# Patient Record
Sex: Female | Born: 1945
Health system: Western US, Academic
[De-identification: ages and names within clinical notes are randomized; demographics above are authoritative.]

## PROBLEM LIST (undated history)

## (undated) DIAGNOSIS — F329 Major depressive disorder, single episode, unspecified: Secondary | ICD-10-CM

## (undated) DIAGNOSIS — R7989 Other specified abnormal findings of blood chemistry: Secondary | ICD-10-CM

## (undated) DIAGNOSIS — I639 Cerebral infarction, unspecified: Secondary | ICD-10-CM

## (undated) DIAGNOSIS — L930 Discoid lupus erythematosus: Secondary | ICD-10-CM

## (undated) DIAGNOSIS — R51 Headache: Secondary | ICD-10-CM

## (undated) DIAGNOSIS — R945 Abnormal results of liver function studies: Secondary | ICD-10-CM

## (undated) DIAGNOSIS — F32A Depression, unspecified: Secondary | ICD-10-CM

## (undated) DIAGNOSIS — D6862 Lupus anticoagulant syndrome: Secondary | ICD-10-CM

## (undated) DIAGNOSIS — D353 Benign neoplasm of craniopharyngeal duct: Secondary | ICD-10-CM

## (undated) DIAGNOSIS — R519 Headache, unspecified: Secondary | ICD-10-CM

## (undated) DIAGNOSIS — F419 Anxiety disorder, unspecified: Secondary | ICD-10-CM

## (undated) DIAGNOSIS — E785 Hyperlipidemia, unspecified: Secondary | ICD-10-CM

## (undated) DIAGNOSIS — Z8679 Personal history of other diseases of the circulatory system: Secondary | ICD-10-CM

## (undated) DIAGNOSIS — I10 Essential (primary) hypertension: Secondary | ICD-10-CM

## (undated) DIAGNOSIS — D352 Benign neoplasm of pituitary gland: Secondary | ICD-10-CM

## (undated) HISTORY — DX: Headache, unspecified: R51.9

## (undated) HISTORY — DX: Benign neoplasm of craniopharyngeal duct: D35.3

## (undated) HISTORY — DX: Abnormal results of liver function studies: R94.5

## (undated) HISTORY — DX: Other specified abnormal findings of blood chemistry: R79.89

## (undated) HISTORY — DX: Major depressive disorder, single episode, unspecified: F32.9

## (undated) HISTORY — DX: Lupus anticoagulant syndrome: D68.62

## (undated) HISTORY — PX: ABDOMINAL HYSTERECTOMY: SHX81

## (undated) HISTORY — DX: Benign neoplasm of pituitary gland: D35.2

## (undated) HISTORY — DX: Essential (primary) hypertension: I10

## (undated) HISTORY — DX: Headache: R51

## (undated) HISTORY — DX: Cerebral infarction, unspecified: I63.9

## (undated) HISTORY — PX: TRANSPHENOIDAL PITUITARY RESECTION: SHX2572

## (undated) HISTORY — DX: Discoid lupus erythematosus: L93.0

## (undated) HISTORY — DX: Depression, unspecified: F32.A

## (undated) HISTORY — DX: Anxiety disorder, unspecified: F41.9

## (undated) HISTORY — DX: Hyperlipidemia, unspecified: E78.5

## (undated) HISTORY — DX: Personal history of other diseases of the circulatory system: Z86.79

---

## 2000-05-05 LAB — HM COLONOSCOPY: HM Colonoscopy: NORMAL

## 2003-01-02 ENCOUNTER — Encounter: Payer: Self-pay | Admitting: Emergency Medicine

## 2003-01-02 ENCOUNTER — Emergency Department (HOSPITAL_COMMUNITY): Admission: EM | Admit: 2003-01-02 | Discharge: 2003-01-02 | Payer: Self-pay | Admitting: Emergency Medicine

## 2003-01-13 ENCOUNTER — Ambulatory Visit (HOSPITAL_COMMUNITY): Admission: RE | Admit: 2003-01-13 | Discharge: 2003-01-13 | Payer: Self-pay | Admitting: Internal Medicine

## 2003-01-13 ENCOUNTER — Encounter: Payer: Self-pay | Admitting: Internal Medicine

## 2003-08-03 ENCOUNTER — Ambulatory Visit (HOSPITAL_COMMUNITY): Admission: RE | Admit: 2003-08-03 | Discharge: 2003-08-03 | Payer: Self-pay | Admitting: Internal Medicine

## 2003-10-19 ENCOUNTER — Ambulatory Visit (HOSPITAL_COMMUNITY): Admission: RE | Admit: 2003-10-19 | Discharge: 2003-10-19 | Payer: Self-pay | Admitting: Internal Medicine

## 2004-03-01 ENCOUNTER — Ambulatory Visit: Payer: Self-pay | Admitting: Internal Medicine

## 2004-03-26 ENCOUNTER — Ambulatory Visit: Payer: Self-pay | Admitting: Family Medicine

## 2004-04-22 ENCOUNTER — Ambulatory Visit: Payer: Self-pay | Admitting: Licensed Clinical Social Worker

## 2004-04-22 ENCOUNTER — Ambulatory Visit: Payer: Self-pay | Admitting: Internal Medicine

## 2004-05-01 ENCOUNTER — Ambulatory Visit: Payer: Self-pay | Admitting: Licensed Clinical Social Worker

## 2004-10-18 ENCOUNTER — Ambulatory Visit: Payer: Self-pay | Admitting: Internal Medicine

## 2004-11-04 ENCOUNTER — Ambulatory Visit: Payer: Self-pay | Admitting: Internal Medicine

## 2004-11-12 ENCOUNTER — Ambulatory Visit: Payer: Self-pay | Admitting: Internal Medicine

## 2004-11-13 ENCOUNTER — Ambulatory Visit: Payer: Self-pay | Admitting: Cardiology

## 2004-12-25 ENCOUNTER — Ambulatory Visit: Payer: Self-pay | Admitting: Internal Medicine

## 2005-01-13 ENCOUNTER — Ambulatory Visit: Payer: Self-pay | Admitting: Internal Medicine

## 2005-02-14 ENCOUNTER — Ambulatory Visit: Payer: Self-pay | Admitting: Internal Medicine

## 2005-03-06 ENCOUNTER — Other Ambulatory Visit: Admission: RE | Admit: 2005-03-06 | Discharge: 2005-03-06 | Payer: Self-pay | Admitting: Obstetrics and Gynecology

## 2005-03-21 ENCOUNTER — Ambulatory Visit: Payer: Self-pay | Admitting: Internal Medicine

## 2005-04-02 ENCOUNTER — Ambulatory Visit: Payer: Self-pay | Admitting: Internal Medicine

## 2005-04-26 ENCOUNTER — Ambulatory Visit: Payer: Self-pay | Admitting: Family Medicine

## 2005-06-20 ENCOUNTER — Ambulatory Visit: Payer: Self-pay | Admitting: Internal Medicine

## 2005-06-26 ENCOUNTER — Ambulatory Visit: Payer: Self-pay | Admitting: Internal Medicine

## 2005-06-30 ENCOUNTER — Ambulatory Visit: Payer: Self-pay | Admitting: Internal Medicine

## 2005-11-07 ENCOUNTER — Ambulatory Visit: Payer: Self-pay | Admitting: Internal Medicine

## 2005-11-08 ENCOUNTER — Encounter: Payer: Self-pay | Admitting: Vascular Surgery

## 2005-11-08 ENCOUNTER — Emergency Department (HOSPITAL_COMMUNITY): Admission: EM | Admit: 2005-11-08 | Discharge: 2005-11-08 | Payer: Self-pay | Admitting: Emergency Medicine

## 2005-11-10 ENCOUNTER — Ambulatory Visit: Payer: Self-pay | Admitting: Internal Medicine

## 2005-11-14 ENCOUNTER — Encounter: Admission: RE | Admit: 2005-11-14 | Discharge: 2005-11-14 | Payer: Self-pay | Admitting: Internal Medicine

## 2005-11-19 ENCOUNTER — Ambulatory Visit: Payer: Self-pay | Admitting: Internal Medicine

## 2006-02-12 ENCOUNTER — Ambulatory Visit: Payer: Self-pay | Admitting: Internal Medicine

## 2006-08-18 ENCOUNTER — Ambulatory Visit: Payer: Self-pay | Admitting: Internal Medicine

## 2006-08-27 ENCOUNTER — Encounter: Payer: Self-pay | Admitting: Internal Medicine

## 2006-08-27 ENCOUNTER — Ambulatory Visit: Payer: Self-pay

## 2006-12-04 DIAGNOSIS — F329 Major depressive disorder, single episode, unspecified: Secondary | ICD-10-CM

## 2006-12-04 DIAGNOSIS — R945 Abnormal results of liver function studies: Secondary | ICD-10-CM | POA: Insufficient documentation

## 2006-12-04 DIAGNOSIS — E785 Hyperlipidemia, unspecified: Secondary | ICD-10-CM | POA: Insufficient documentation

## 2006-12-04 DIAGNOSIS — F32A Depression, unspecified: Secondary | ICD-10-CM | POA: Insufficient documentation

## 2006-12-04 DIAGNOSIS — I10 Essential (primary) hypertension: Secondary | ICD-10-CM | POA: Insufficient documentation

## 2006-12-18 ENCOUNTER — Ambulatory Visit: Payer: Self-pay | Admitting: Internal Medicine

## 2006-12-18 LAB — CONVERTED CEMR LAB
Bilirubin Urine: NEGATIVE
Glucose, Urine, Semiquant: NEGATIVE
Ketones, urine, test strip: NEGATIVE
Nitrite: POSITIVE
Protein, U semiquant: NEGATIVE
Specific Gravity, Urine: 1.01
Urobilinogen, UA: NEGATIVE
pH: 7

## 2007-02-08 ENCOUNTER — Telehealth: Payer: Self-pay | Admitting: Internal Medicine

## 2007-03-05 ENCOUNTER — Ambulatory Visit: Payer: Self-pay | Admitting: Internal Medicine

## 2007-03-15 ENCOUNTER — Telehealth: Payer: Self-pay | Admitting: Internal Medicine

## 2007-03-17 ENCOUNTER — Telehealth: Payer: Self-pay | Admitting: Internal Medicine

## 2007-03-30 ENCOUNTER — Ambulatory Visit: Payer: Self-pay | Admitting: Internal Medicine

## 2007-03-30 ENCOUNTER — Telehealth: Payer: Self-pay | Admitting: Internal Medicine

## 2007-06-15 ENCOUNTER — Telehealth: Payer: Self-pay | Admitting: Internal Medicine

## 2007-06-21 ENCOUNTER — Telehealth: Payer: Self-pay | Admitting: Internal Medicine

## 2007-07-27 ENCOUNTER — Ambulatory Visit: Payer: Self-pay | Admitting: Internal Medicine

## 2007-08-10 ENCOUNTER — Ambulatory Visit: Payer: Self-pay | Admitting: Internal Medicine

## 2007-08-11 LAB — CONVERTED CEMR LAB
Albumin: 3.8 g/dL (ref 3.5–5.2)
BUN: 16 mg/dL (ref 6–23)
Calcium: 9.4 mg/dL (ref 8.4–10.5)
Eosinophils Relative: 2.2 % (ref 0.0–5.0)
GFR calc Af Amer: 109 mL/min
Glucose, Bld: 97 mg/dL (ref 70–99)
HCT: 42.2 % (ref 36.0–46.0)
Hemoglobin: 13.9 g/dL (ref 12.0–15.0)
Monocytes Absolute: 0.5 10*3/uL (ref 0.1–1.0)
Monocytes Relative: 8.9 % (ref 3.0–12.0)
Neutro Abs: 3.2 10*3/uL (ref 1.4–7.7)
RDW: 12 % (ref 11.5–14.6)
Sodium: 142 meq/L (ref 135–145)
Total Protein: 6.6 g/dL (ref 6.0–8.3)

## 2007-08-16 ENCOUNTER — Telehealth: Payer: Self-pay | Admitting: Internal Medicine

## 2007-09-07 ENCOUNTER — Telehealth: Payer: Self-pay | Admitting: Internal Medicine

## 2007-10-11 ENCOUNTER — Telehealth: Payer: Self-pay | Admitting: Internal Medicine

## 2007-10-14 ENCOUNTER — Ambulatory Visit: Payer: Self-pay | Admitting: Internal Medicine

## 2007-11-01 ENCOUNTER — Telehealth: Payer: Self-pay | Admitting: Internal Medicine

## 2008-03-09 ENCOUNTER — Ambulatory Visit: Payer: Self-pay | Admitting: Internal Medicine

## 2008-03-10 LAB — CONVERTED CEMR LAB
ALT: 35 units/L (ref 0–35)
AST: 25 units/L (ref 0–37)
Albumin: 3.6 g/dL (ref 3.5–5.2)
BUN: 16 mg/dL (ref 6–23)
Basophils Relative: 0.7 % (ref 0.0–3.0)
CO2: 29 meq/L (ref 19–32)
Chloride: 106 meq/L (ref 96–112)
Creatinine, Ser: 0.7 mg/dL (ref 0.4–1.2)
Eosinophils Relative: 2.4 % (ref 0.0–5.0)
Lymphocytes Relative: 34.9 % (ref 12.0–46.0)
Neutrophils Relative %: 53.1 % (ref 43.0–77.0)
RBC: 4.47 M/uL (ref 3.87–5.11)
TSH: 0.86 microintl units/mL (ref 0.35–5.50)
Total Bilirubin: 0.6 mg/dL (ref 0.3–1.2)
WBC: 5.3 10*3/uL (ref 4.5–10.5)

## 2008-04-07 ENCOUNTER — Telehealth: Payer: Self-pay | Admitting: Internal Medicine

## 2008-07-21 ENCOUNTER — Telehealth: Payer: Self-pay | Admitting: Family Medicine

## 2008-07-21 ENCOUNTER — Emergency Department (HOSPITAL_COMMUNITY): Admission: EM | Admit: 2008-07-21 | Discharge: 2008-07-21 | Payer: Self-pay | Admitting: Family Medicine

## 2008-09-28 ENCOUNTER — Ambulatory Visit: Payer: Self-pay | Admitting: Family Medicine

## 2008-10-18 ENCOUNTER — Telehealth (INDEPENDENT_AMBULATORY_CARE_PROVIDER_SITE_OTHER): Payer: Self-pay | Admitting: *Deleted

## 2008-11-13 ENCOUNTER — Telehealth: Payer: Self-pay | Admitting: Internal Medicine

## 2008-11-20 ENCOUNTER — Telehealth (INDEPENDENT_AMBULATORY_CARE_PROVIDER_SITE_OTHER): Payer: Self-pay | Admitting: *Deleted

## 2008-11-21 ENCOUNTER — Ambulatory Visit: Payer: Self-pay | Admitting: Family Medicine

## 2008-12-01 ENCOUNTER — Telehealth: Payer: Self-pay | Admitting: Internal Medicine

## 2008-12-07 ENCOUNTER — Ambulatory Visit (HOSPITAL_COMMUNITY): Admission: RE | Admit: 2008-12-07 | Discharge: 2008-12-07 | Payer: Self-pay | Admitting: Ophthalmology

## 2008-12-08 ENCOUNTER — Telehealth: Payer: Self-pay | Admitting: *Deleted

## 2008-12-20 ENCOUNTER — Ambulatory Visit: Payer: Self-pay | Admitting: Internal Medicine

## 2008-12-20 DIAGNOSIS — D444 Neoplasm of uncertain behavior of craniopharyngeal duct: Secondary | ICD-10-CM

## 2008-12-20 DIAGNOSIS — D443 Neoplasm of uncertain behavior of pituitary gland: Secondary | ICD-10-CM | POA: Insufficient documentation

## 2008-12-21 ENCOUNTER — Telehealth: Payer: Self-pay | Admitting: Internal Medicine

## 2008-12-22 DIAGNOSIS — Z86011 Personal history of benign neoplasm of the brain: Secondary | ICD-10-CM

## 2008-12-22 DIAGNOSIS — Z8603 Personal history of neoplasm of uncertain behavior: Secondary | ICD-10-CM | POA: Insufficient documentation

## 2008-12-26 LAB — CONVERTED CEMR LAB
Albumin: 3.6 g/dL (ref 3.5–5.2)
BUN: 17 mg/dL (ref 6–23)
Bilirubin, Direct: 0.1 mg/dL (ref 0.0–0.3)
CO2: 30 meq/L (ref 19–32)
Calcium: 9.2 mg/dL (ref 8.4–10.5)
Creatinine, Ser: 0.7 mg/dL (ref 0.4–1.2)
FSH: 28.8 milliintl units/mL
LH: 8.69 milliintl units/mL
Prolactin: 10.5 ng/mL
Total Protein: 6.9 g/dL (ref 6.0–8.3)

## 2008-12-28 ENCOUNTER — Telehealth: Payer: Self-pay | Admitting: Internal Medicine

## 2008-12-29 ENCOUNTER — Encounter: Payer: Self-pay | Admitting: Family Medicine

## 2009-01-26 ENCOUNTER — Ambulatory Visit: Payer: Self-pay | Admitting: Internal Medicine

## 2009-01-29 ENCOUNTER — Telehealth (INDEPENDENT_AMBULATORY_CARE_PROVIDER_SITE_OTHER): Payer: Self-pay | Admitting: *Deleted

## 2009-02-27 ENCOUNTER — Encounter (INDEPENDENT_AMBULATORY_CARE_PROVIDER_SITE_OTHER): Payer: Self-pay | Admitting: *Deleted

## 2009-02-28 ENCOUNTER — Ambulatory Visit: Payer: Self-pay | Admitting: Internal Medicine

## 2009-03-01 ENCOUNTER — Telehealth: Payer: Self-pay | Admitting: Internal Medicine

## 2009-03-19 ENCOUNTER — Telehealth (INDEPENDENT_AMBULATORY_CARE_PROVIDER_SITE_OTHER): Payer: Self-pay | Admitting: *Deleted

## 2009-03-22 ENCOUNTER — Ambulatory Visit: Payer: Self-pay | Admitting: Cardiology

## 2009-03-22 ENCOUNTER — Inpatient Hospital Stay (HOSPITAL_COMMUNITY): Admission: RE | Admit: 2009-03-22 | Discharge: 2009-04-13 | Payer: Self-pay | Admitting: Neurosurgery

## 2009-04-02 ENCOUNTER — Ambulatory Visit: Payer: Self-pay | Admitting: Infectious Diseases

## 2009-04-09 ENCOUNTER — Encounter (INDEPENDENT_AMBULATORY_CARE_PROVIDER_SITE_OTHER): Payer: Self-pay | Admitting: Neurosurgery

## 2009-04-10 ENCOUNTER — Ambulatory Visit: Payer: Self-pay | Admitting: Physical Medicine & Rehabilitation

## 2009-04-11 ENCOUNTER — Encounter (INDEPENDENT_AMBULATORY_CARE_PROVIDER_SITE_OTHER): Payer: Self-pay | Admitting: Neurosurgery

## 2009-04-12 ENCOUNTER — Encounter (INDEPENDENT_AMBULATORY_CARE_PROVIDER_SITE_OTHER): Payer: Self-pay | Admitting: Neurology

## 2009-04-13 ENCOUNTER — Ambulatory Visit: Payer: Self-pay | Admitting: Oncology

## 2009-04-13 ENCOUNTER — Inpatient Hospital Stay (HOSPITAL_COMMUNITY)
Admission: RE | Admit: 2009-04-13 | Discharge: 2009-04-19 | Payer: Self-pay | Admitting: Physical Medicine & Rehabilitation

## 2009-04-18 ENCOUNTER — Ambulatory Visit: Payer: Self-pay | Admitting: Physical Medicine & Rehabilitation

## 2009-04-19 ENCOUNTER — Ambulatory Visit: Payer: Self-pay | Admitting: Oncology

## 2009-05-02 ENCOUNTER — Encounter
Admission: RE | Admit: 2009-05-02 | Discharge: 2009-05-03 | Payer: Self-pay | Admitting: Physical Medicine & Rehabilitation

## 2009-05-03 ENCOUNTER — Encounter
Admission: RE | Admit: 2009-05-03 | Discharge: 2009-08-01 | Payer: Self-pay | Admitting: Physical Medicine & Rehabilitation

## 2009-05-08 ENCOUNTER — Encounter: Payer: Self-pay | Admitting: Internal Medicine

## 2009-05-08 LAB — CBC WITH DIFFERENTIAL/PLATELET
BASO%: 1 % (ref 0.0–2.0)
EOS%: 2.3 % (ref 0.0–7.0)
HCT: 40 % (ref 34.8–46.6)
LYMPH%: 27.1 % (ref 14.0–49.7)
MCH: 32.7 pg (ref 25.1–34.0)
MCHC: 34.5 g/dL (ref 31.5–36.0)
MONO#: 0.5 10*3/uL (ref 0.1–0.9)
NEUT%: 59 % (ref 38.4–76.8)
Platelets: 209 10*3/uL (ref 145–400)
RBC: 4.23 10*6/uL (ref 3.70–5.45)
WBC: 4.7 10*3/uL (ref 3.9–10.3)
lymph#: 1.3 10*3/uL (ref 0.9–3.3)

## 2009-05-08 LAB — PROTHROMBIN TIME
INR: 1.05 (ref <1.50)
Prothrombin Time: 13.6 seconds (ref 11.6–15.2)

## 2009-05-08 LAB — MORPHOLOGY: PLT EST: ADEQUATE

## 2009-05-09 LAB — LUPUS ANTICOAGULANT PANEL: PTTLA Confirmation: 3.3 secs (ref <8.0)

## 2009-05-09 LAB — COMPREHENSIVE METABOLIC PANEL
ALT: 126 U/L — ABNORMAL HIGH (ref 0–35)
AST: 52 U/L — ABNORMAL HIGH (ref 0–37)
Albumin: 3.9 g/dL (ref 3.5–5.2)
CO2: 23 mEq/L (ref 19–32)
Calcium: 9.2 mg/dL (ref 8.4–10.5)
Chloride: 106 mEq/L (ref 96–112)
Potassium: 4.2 mEq/L (ref 3.5–5.3)
Total Protein: 6.7 g/dL (ref 6.0–8.3)

## 2009-05-09 LAB — LACTATE DEHYDROGENASE: LDH: 144 U/L (ref 94–250)

## 2009-05-15 ENCOUNTER — Encounter: Payer: Self-pay | Admitting: Internal Medicine

## 2009-05-16 ENCOUNTER — Encounter: Payer: Self-pay | Admitting: Family Medicine

## 2009-05-17 ENCOUNTER — Ambulatory Visit: Payer: Self-pay | Admitting: Oncology

## 2009-05-18 ENCOUNTER — Encounter: Payer: Self-pay | Admitting: Internal Medicine

## 2009-05-19 ENCOUNTER — Emergency Department (HOSPITAL_COMMUNITY): Admission: EM | Admit: 2009-05-19 | Discharge: 2009-05-20 | Payer: Self-pay | Admitting: Emergency Medicine

## 2009-05-21 ENCOUNTER — Encounter: Payer: Self-pay | Admitting: Internal Medicine

## 2009-05-21 LAB — CBC WITH DIFFERENTIAL/PLATELET
BASO%: 1.2 % (ref 0.0–2.0)
Basophils Absolute: 0.1 10*3/uL (ref 0.0–0.1)
EOS%: 2.9 % (ref 0.0–7.0)
Eosinophils Absolute: 0.1 10*3/uL (ref 0.0–0.5)
HCT: 39.5 % (ref 34.8–46.6)
HGB: 13.4 g/dL (ref 11.6–15.9)
LYMPH%: 37 % (ref 14.0–49.7)
MCH: 31.6 pg (ref 25.1–34.0)
MCHC: 33.9 g/dL (ref 31.5–36.0)
MCV: 93.2 fL (ref 79.5–101.0)
MONO#: 0.6 10*3/uL (ref 0.1–0.9)
MONO%: 12.3 % (ref 0.0–14.0)
NEUT#: 2.2 10*3/uL (ref 1.5–6.5)
NEUT%: 46.6 % (ref 38.4–76.8)
Platelets: 205 10*3/uL (ref 145–400)
RBC: 4.24 10*6/uL (ref 3.70–5.45)
RDW: 12.8 % (ref 11.2–14.5)
WBC: 4.8 10*3/uL (ref 3.9–10.3)
lymph#: 1.8 10*3/uL (ref 0.9–3.3)

## 2009-05-21 LAB — PROTIME-INR
INR: 1.6 — ABNORMAL LOW (ref 2.00–3.50)
Protime: 19.2 Seconds — ABNORMAL HIGH (ref 10.6–13.4)

## 2009-05-25 LAB — PROTIME-INR: Protime: 24 Seconds — ABNORMAL HIGH (ref 10.6–13.4)

## 2009-05-29 ENCOUNTER — Encounter: Payer: Self-pay | Admitting: Internal Medicine

## 2009-05-29 LAB — CBC WITH DIFFERENTIAL/PLATELET
BASO%: 0.7 % (ref 0.0–2.0)
EOS%: 2.5 % (ref 0.0–7.0)
HCT: 42.3 % (ref 34.8–46.6)
LYMPH%: 35.7 % (ref 14.0–49.7)
MCH: 31.3 pg (ref 25.1–34.0)
MCHC: 33.6 g/dL (ref 31.5–36.0)
NEUT%: 50 % (ref 38.4–76.8)
Platelets: 209 10*3/uL (ref 145–400)
RBC: 4.54 10*6/uL (ref 3.70–5.45)
lymph#: 2.5 10*3/uL (ref 0.9–3.3)
nRBC: 0 % (ref 0–0)

## 2009-06-01 ENCOUNTER — Encounter: Payer: Self-pay | Admitting: Internal Medicine

## 2009-06-01 LAB — PROTIME-INR

## 2009-06-04 LAB — COMPREHENSIVE METABOLIC PANEL
ALT: 40 U/L — ABNORMAL HIGH (ref 0–35)
Albumin: 4 g/dL (ref 3.5–5.2)
CO2: 24 mEq/L (ref 19–32)
Calcium: 9.5 mg/dL (ref 8.4–10.5)
Chloride: 106 mEq/L (ref 96–112)
Sodium: 142 mEq/L (ref 135–145)
Total Protein: 6.7 g/dL (ref 6.0–8.3)

## 2009-06-04 LAB — LUPUS ANTICOAGULANT PANEL
DRVVT 1:1 Mix: 43.9 secs (ref 36.1–47.0)
PTT Lupus Anticoagulant: 63.4 secs — ABNORMAL HIGH (ref 32.0–43.4)

## 2009-06-08 LAB — PROTIME-INR: Protime: 28.8 Seconds — ABNORMAL HIGH (ref 10.6–13.4)

## 2009-06-11 LAB — HEXAGONAL PHOSPHOLIPID NEUTRALIZATION: Hex Phosph Neut Test: POSITIVE — AB

## 2009-06-19 ENCOUNTER — Ambulatory Visit: Payer: Self-pay | Admitting: Oncology

## 2009-06-19 ENCOUNTER — Encounter: Payer: Self-pay | Admitting: Internal Medicine

## 2009-06-21 ENCOUNTER — Ambulatory Visit: Payer: Self-pay | Admitting: Internal Medicine

## 2009-06-22 ENCOUNTER — Encounter: Payer: Self-pay | Admitting: Internal Medicine

## 2009-06-22 LAB — BASIC METABOLIC PANEL
BUN: 15 mg/dL (ref 6–23)
Calcium: 9.1 mg/dL (ref 8.4–10.5)
Potassium: 3.9 mEq/L (ref 3.5–5.3)

## 2009-06-22 LAB — PROTIME-INR
INR: 1.9 — ABNORMAL LOW (ref 2.00–3.50)
Protime: 22.8 Seconds — ABNORMAL HIGH (ref 10.6–13.4)

## 2009-07-17 ENCOUNTER — Encounter
Admission: RE | Admit: 2009-07-17 | Discharge: 2009-07-18 | Payer: Self-pay | Admitting: Physical Medicine & Rehabilitation

## 2009-07-18 ENCOUNTER — Ambulatory Visit: Payer: Self-pay | Admitting: Physical Medicine & Rehabilitation

## 2009-07-27 ENCOUNTER — Ambulatory Visit: Payer: Self-pay | Admitting: Oncology

## 2009-07-31 ENCOUNTER — Encounter: Payer: Self-pay | Admitting: Internal Medicine

## 2009-07-31 LAB — SEDIMENTATION RATE: Sed Rate: 16 mm/h (ref 0–22)

## 2009-07-31 LAB — CBC WITH DIFFERENTIAL/PLATELET
BASO%: 0.6 % (ref 0.0–2.0)
LYMPH%: 31.3 % (ref 14.0–49.7)
MCHC: 34.1 g/dL (ref 31.5–36.0)
MCV: 92.8 fL (ref 79.5–101.0)
MONO%: 11.1 % (ref 0.0–14.0)
Platelets: 195 10*3/uL (ref 145–400)
RBC: 4.3 10*6/uL (ref 3.70–5.45)

## 2009-07-31 LAB — PROTIME-INR: Protime: 32.4 Seconds — ABNORMAL HIGH (ref 10.6–13.4)

## 2009-08-01 ENCOUNTER — Encounter: Payer: Self-pay | Admitting: Internal Medicine

## 2009-08-03 ENCOUNTER — Encounter: Admission: RE | Admit: 2009-08-03 | Discharge: 2009-08-03 | Payer: Self-pay | Admitting: Neurology

## 2009-08-06 ENCOUNTER — Ambulatory Visit: Payer: Self-pay | Admitting: Internal Medicine

## 2009-08-07 LAB — CONVERTED CEMR LAB
ALT: 25 units/L (ref 0–35)
AST: 17 units/L (ref 0–37)
Alkaline Phosphatase: 81 units/L (ref 39–117)
BUN: 18 mg/dL (ref 6–23)
Basophils Absolute: 0 10*3/uL (ref 0.0–0.1)
Bilirubin, Direct: 0.1 mg/dL (ref 0.0–0.3)
Calcium: 9.1 mg/dL (ref 8.4–10.5)
Creatinine, Ser: 0.7 mg/dL (ref 0.4–1.2)
GFR calc non Af Amer: 89.74 mL/min (ref >60)
Glucose, Bld: 100 mg/dL — ABNORMAL HIGH (ref 70–99)
HCT: 41.9 % (ref 36.0–46.0)
Hemoglobin: 14.5 g/dL (ref 12.0–15.0)
Lymphs Abs: 2.1 10*3/uL (ref 0.7–4.0)
MCV: 92.8 fL (ref 78.0–100.0)
Monocytes Absolute: 0.5 10*3/uL (ref 0.1–1.0)
Neutro Abs: 3.2 10*3/uL (ref 1.4–7.7)
Platelets: 202 10*3/uL (ref 150.0–400.0)
RDW: 12.5 % (ref 11.5–14.6)
Sodium: 142 meq/L (ref 135–145)
Total Bilirubin: 0.5 mg/dL (ref 0.3–1.2)
Total CHOL/HDL Ratio: 3

## 2009-08-13 ENCOUNTER — Encounter
Admission: RE | Admit: 2009-08-13 | Discharge: 2009-09-11 | Payer: Self-pay | Admitting: Physical Medicine & Rehabilitation

## 2009-08-17 ENCOUNTER — Encounter: Payer: Self-pay | Admitting: Internal Medicine

## 2009-08-27 ENCOUNTER — Telehealth: Payer: Self-pay | Admitting: Internal Medicine

## 2009-08-27 ENCOUNTER — Ambulatory Visit: Payer: Self-pay | Admitting: Oncology

## 2009-08-28 ENCOUNTER — Encounter: Payer: Self-pay | Admitting: Internal Medicine

## 2009-08-28 LAB — COMPREHENSIVE METABOLIC PANEL
ALT: 16 U/L (ref 0–35)
AST: 13 U/L (ref 0–37)
CO2: 26 mEq/L (ref 19–32)
Chloride: 107 mEq/L (ref 96–112)
Sodium: 143 mEq/L (ref 135–145)
Total Bilirubin: 0.5 mg/dL (ref 0.3–1.2)
Total Protein: 6.4 g/dL (ref 6.0–8.3)

## 2009-08-28 LAB — PROTIME-INR

## 2009-08-29 ENCOUNTER — Ambulatory Visit: Payer: Self-pay | Admitting: Internal Medicine

## 2009-08-29 ENCOUNTER — Telehealth: Payer: Self-pay | Admitting: Internal Medicine

## 2009-09-04 ENCOUNTER — Encounter: Admission: RE | Admit: 2009-09-04 | Discharge: 2009-09-04 | Payer: Self-pay | Admitting: Neurosurgery

## 2009-09-11 ENCOUNTER — Encounter
Admission: RE | Admit: 2009-09-11 | Discharge: 2009-09-11 | Payer: Self-pay | Admitting: Physical Medicine & Rehabilitation

## 2009-09-26 ENCOUNTER — Ambulatory Visit: Payer: Self-pay | Admitting: Oncology

## 2009-09-26 LAB — CBC WITH DIFFERENTIAL/PLATELET
Basophils Absolute: 0 10*3/uL (ref 0.0–0.1)
EOS%: 1.9 % (ref 0.0–7.0)
HCT: 43 % (ref 34.8–46.6)
HGB: 14.5 g/dL (ref 11.6–15.9)
MCH: 30.4 pg (ref 25.1–34.0)
MCV: 90.4 fL (ref 79.5–101.0)
MONO%: 7.4 % (ref 0.0–14.0)
NEUT%: 57.6 % (ref 38.4–76.8)
Platelets: 200 10*3/uL (ref 145–400)

## 2009-09-26 LAB — PROTIME-INR

## 2009-09-27 ENCOUNTER — Telehealth: Payer: Self-pay | Admitting: Internal Medicine

## 2009-09-28 ENCOUNTER — Telehealth: Payer: Self-pay | Admitting: Internal Medicine

## 2009-10-05 ENCOUNTER — Telehealth: Payer: Self-pay | Admitting: Internal Medicine

## 2009-10-11 LAB — PROTIME-INR
INR: 1.9 — ABNORMAL LOW (ref 2.00–3.50)
Protime: 22.8 Seconds — ABNORMAL HIGH (ref 10.6–13.4)

## 2009-11-06 ENCOUNTER — Ambulatory Visit: Payer: Self-pay | Admitting: Internal Medicine

## 2009-11-08 ENCOUNTER — Telehealth: Payer: Self-pay | Admitting: Internal Medicine

## 2009-11-16 ENCOUNTER — Ambulatory Visit: Payer: Self-pay | Admitting: Oncology

## 2009-11-20 LAB — CBC WITH DIFFERENTIAL/PLATELET
Basophils Absolute: 0 10*3/uL (ref 0.0–0.1)
Eosinophils Absolute: 0.1 10*3/uL (ref 0.0–0.5)
HGB: 14.3 g/dL (ref 11.6–15.9)
MCV: 91.3 fL (ref 79.5–101.0)
MONO%: 9.8 % (ref 0.0–14.0)
NEUT#: 3.2 10*3/uL (ref 1.5–6.5)
RDW: 12.6 % (ref 11.2–14.5)

## 2009-11-21 LAB — BETA-2 GLYCOPROTEIN ANTIBODIES
Beta-2-Glycoprotein I IgA: 2 A Units (ref <20)
Beta-2-Glycoprotein I IgM: 9 M Units (ref <20)

## 2009-11-21 LAB — CARDIOLIPIN ANTIBODIES, IGG, IGM, IGA: Anticardiolipin IgG: 13 GPL U/mL (ref <23)

## 2009-11-22 LAB — LUPUS ANTICOAGULANT PANEL

## 2009-11-27 ENCOUNTER — Encounter: Payer: Self-pay | Admitting: Internal Medicine

## 2009-12-11 LAB — PROTIME-INR: Protime: 31.2 Seconds — ABNORMAL HIGH (ref 10.6–13.4)

## 2009-12-25 ENCOUNTER — Encounter: Payer: Self-pay | Admitting: Internal Medicine

## 2010-01-03 ENCOUNTER — Ambulatory Visit: Payer: Self-pay | Admitting: Oncology

## 2010-01-08 LAB — PROTIME-INR
INR: 2.4 (ref 2.00–3.50)
Protime: 28.8 Seconds — ABNORMAL HIGH (ref 10.6–13.4)

## 2010-01-14 ENCOUNTER — Telehealth: Payer: Self-pay | Admitting: Internal Medicine

## 2010-01-28 ENCOUNTER — Telehealth: Payer: Self-pay | Admitting: Internal Medicine

## 2010-02-05 ENCOUNTER — Ambulatory Visit: Payer: Self-pay | Admitting: Internal Medicine

## 2010-02-11 ENCOUNTER — Ambulatory Visit: Payer: Self-pay | Admitting: Family Medicine

## 2010-02-11 DIAGNOSIS — N390 Urinary tract infection, site not specified: Secondary | ICD-10-CM | POA: Insufficient documentation

## 2010-02-11 DIAGNOSIS — L732 Hidradenitis suppurativa: Secondary | ICD-10-CM | POA: Insufficient documentation

## 2010-02-11 DIAGNOSIS — R319 Hematuria, unspecified: Secondary | ICD-10-CM | POA: Insufficient documentation

## 2010-02-12 ENCOUNTER — Encounter: Payer: Self-pay | Admitting: Internal Medicine

## 2010-02-15 ENCOUNTER — Ambulatory Visit: Payer: Self-pay | Admitting: Family Medicine

## 2010-02-15 DIAGNOSIS — L03211 Cellulitis of face: Secondary | ICD-10-CM

## 2010-02-15 DIAGNOSIS — L0201 Cutaneous abscess of face: Secondary | ICD-10-CM | POA: Insufficient documentation

## 2010-02-15 DIAGNOSIS — T887XXA Unspecified adverse effect of drug or medicament, initial encounter: Secondary | ICD-10-CM | POA: Insufficient documentation

## 2010-02-25 ENCOUNTER — Encounter: Payer: Self-pay | Admitting: Internal Medicine

## 2010-03-08 ENCOUNTER — Ambulatory Visit: Payer: Self-pay | Admitting: Oncology

## 2010-03-11 ENCOUNTER — Telehealth: Payer: Self-pay | Admitting: Internal Medicine

## 2010-03-12 ENCOUNTER — Encounter: Payer: Self-pay | Admitting: Internal Medicine

## 2010-03-12 LAB — CBC WITH DIFFERENTIAL/PLATELET
Eosinophils Absolute: 0.1 10*3/uL (ref 0.0–0.5)
LYMPH%: 38.6 % (ref 14.0–49.7)
MONO#: 0.6 10*3/uL (ref 0.1–0.9)
NEUT#: 2.9 10*3/uL (ref 1.5–6.5)
Platelets: 199 10*3/uL (ref 145–400)
RBC: 5.07 10*6/uL (ref 3.70–5.45)
RDW: 13.1 % (ref 11.2–14.5)
WBC: 5.9 10*3/uL (ref 3.9–10.3)
lymph#: 2.3 10*3/uL (ref 0.9–3.3)
nRBC: 0 % (ref 0–0)

## 2010-03-12 LAB — PROTIME-INR
INR: 2.8 (ref 2.00–3.50)
Protime: 33.6 Seconds — ABNORMAL HIGH (ref 10.6–13.4)

## 2010-03-14 ENCOUNTER — Telehealth: Payer: Self-pay | Admitting: Internal Medicine

## 2010-03-20 ENCOUNTER — Telehealth: Payer: Self-pay | Admitting: Internal Medicine

## 2010-04-16 ENCOUNTER — Encounter
Admission: RE | Admit: 2010-04-16 | Discharge: 2010-04-16 | Payer: Self-pay | Source: Home / Self Care | Attending: Neurosurgery | Admitting: Neurosurgery

## 2010-05-01 ENCOUNTER — Ambulatory Visit: Payer: Self-pay | Admitting: Oncology

## 2010-05-08 ENCOUNTER — Telehealth: Payer: Self-pay | Admitting: Internal Medicine

## 2010-05-14 LAB — PROTIME-INR
INR: 3.1 (ref 2.00–3.50)
Protime: 37.2 Seconds — ABNORMAL HIGH (ref 10.6–13.4)

## 2010-05-26 ENCOUNTER — Encounter: Payer: Self-pay | Admitting: Internal Medicine

## 2010-05-28 LAB — PROTIME-INR
INR: 2.1 (ref 2.00–3.50)
Protime: 25.2 Seconds — ABNORMAL HIGH (ref 10.6–13.4)

## 2010-06-02 LAB — CONVERTED CEMR LAB
ALT: 29 units/L (ref 0–40)
AST: 23 units/L (ref 0–37)
Albumin: 3.8 g/dL (ref 3.5–5.2)
Alkaline Phosphatase: 93 units/L (ref 39–117)
BUN: 19 mg/dL (ref 6–23)
Bilirubin, Direct: 0.1 mg/dL (ref 0.0–0.3)
CO2: 29 meq/L (ref 19–32)
Calcium: 9.8 mg/dL (ref 8.4–10.5)
Chloride: 107 meq/L (ref 96–112)
Cholesterol: 203 mg/dL (ref 0–200)
Creatinine, Ser: 0.7 mg/dL (ref 0.4–1.2)
Direct LDL: 120.6 mg/dL
GFR calc Af Amer: 110 mL/min
GFR calc non Af Amer: 91 mL/min
Glucose, Bld: 100 mg/dL — ABNORMAL HIGH (ref 70–99)
HDL: 56.3 mg/dL (ref >39.0)
Potassium: 4 meq/L (ref 3.5–5.1)
Sodium: 144 meq/L (ref 135–145)
Total Bilirubin: 0.7 mg/dL (ref 0.3–1.2)
Total CHOL/HDL Ratio: 3.6
Total Protein: 7.4 g/dL (ref 6.0–8.3)
Triglycerides: 114 mg/dL (ref 0–149)
VLDL: 23 mg/dL (ref 0–40)

## 2010-06-04 NOTE — Assessment & Plan Note (Signed)
Summary: fu per hus/njr   Vital Signs:  Patient profile:   65 year old female Weight:      192 pounds BMI:     33.08 Temp:     98 degrees F oral Pulse rate:   68 / minute Resp:     12 per minute BP sitting:   132 / 80  (left arm)  Vitals Entered By: Gladis Riffle, RN (June 21, 2009 10:26 AM) CC: hospital FU Is Patient Diabetic? No Comments now walks with cane   CC:  hospital FU.  History of Present Illness:  Follow-Up Visit      This is a 65 year old woman who presents for Follow-up visit.  The patient denies chest pain and palpitations.  Since the last visit the patient notes a recent hospitilization and being seen by a specialist.  The patient reports taking meds as prescribed.  When questioned about possible medication side effects, the patient notes none.   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 Take One Tablet By Mouth Daily 2)  Famotidine 20 Mg Tabs (Famotidine) .... Take 1 Tablet By Mouth Two Times A Day 3)  Simvastatin 20 Mg Tabs (Simvastatin) .... Take 1 Tablet By Mouth At Bedtime 4)  Hydrocortisone 10 Mg Tabs (Hydrocortisone) .... Take 1 Tablet By Mouth Once A Day 5)  Warfarin Sodium 5 Mg Tabs (Warfarin Sodium) .... Take 1 Tablet By Mouth Once A Day As Directed 6)  Aspir-Low 81 Mg Tbec (Aspirin) .... Once Daily  Allergies: 1)  ! 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   Eyes:  pupils equal and pupils round.   Neck:  No deformities, masses, or tenderness noted. Lungs:  Normal respiratory effort, chest expands symmetrically. Lungs are clear to auscultation, no crackles or wheezes Heart:  normal rate and regular rhythm.   Abdomen:  soft and non-tender.   Msk:  No deformity or scoliosis noted of thoracic or lumbar spine.   Neurologic:  cranial nerves II-XII intact and gait normal.   Psych:  memory intact for recent and remote and not anxious appearing.     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 Take 1 tab by mouth every morning  BP today: 132/80 Prior BP: 130/84 (02/28/2009)  Labs  Reviewed: K+: 3.3 (12/20/2008) Creat: : 0.7 (12/20/2008)   Chol: 203 (08/18/2006)   HDL: 56.3 (08/18/2006)   LDL: DEL (08/18/2006)   TG: 114 (08/18/2006)  Problem # 4:  HYPERLIPIDEMIA (ICD-272.4) controlled continue current medications  Her updated medication list for this problem includes:    Simvastatin 20 Mg Tabs (Simvastatin) .Marland Kitchen... Take 1 tablet by mouth at bedtime  Labs Reviewed: SGOT: 28 (12/20/2008)   SGPT: 43 (12/20/2008)   HDL:56.3 (08/18/2006)  LDL:DEL (08/18/2006)  Chol:203 (08/18/2006)  Trig:114 (08/18/2006)  Problem # 5:  DEPRESSION (ICD-311)  Her updated medication list for this problem includes:    Citalopram Hydrobromide 40 Mg Tabs (Citalopram hydrobromide) .Marland Kitchen... Take one tablet by mouth daily  Problem # 6:  CVA (ICD-434.91) postoperative not hypercoagulable state  Her updated medication list for this problem includes:    Warfarin Sodium 5 Mg Tabs (Warfarin sodium) .Marland Kitchen... Take 1 tablet by mouth once a day as directed    Aspir-low 81 Mg Tbec (Aspirin) ..... Once daily  Complete Medication List: 1)  Citalopram Hydrobromide 40 Mg Tabs (Citalopram hydrobromide) .... Take one tablet by mouth daily 2)  Famotidine 20 Mg Tabs (Famotidine) .... Take 1 tablet by mouth two times a day 3)  Simvastatin 20 Mg Tabs (Simvastatin) .... Take 1 tablet by mouth at bedtime 4)  Hydrocortisone 10 Mg Tabs (Hydrocortisone) .... Take 1 tablet by mouth once a day 5)  Warfarin Sodium 5 Mg Tabs (Warfarin sodium) .... Take 1 tablet by mouth once a day as directed 6)  Aspir-low 81 Mg Tbec (Aspirin) .... Once daily  Patient Instructions: 1)  6 weeks Prescriptions: FAMOTIDINE 20 MG TABS (FAMOTIDINE) Take 1 tablet by mouth two times a day  #60 x 0   Entered by:   Kern Reap CMA (AAMA)   Authorized by:   Birdie Sons MD   Signed by:   Kern Reap CMA (AAMA) on 06/29/2009   Method used:   [facility]  [place] [address] [facility]       [address]        [place], [place]  [address]       Ph: [phone] or [phone]       Fax: [phone]   RxID:   [id number]

## 2010-06-04 NOTE — Progress Notes (Signed)
Summary:  Prudy Feeler) restart?  Phone Note Refill Request Message from:  Fax from Pharmacy on Sep 27, 2009 1:42 PM  Refills Requested: Medication #1:  ALPRAZOLAM 0.5MG  TABLET   Notes: Qty-30.Marland Kitchen..5 refills...Marland KitchenMarland KitchenMarland Kitchen [facility].    Initial call taken by: Debbra Riding,  Sep 27, 2009 1:43 PM  Follow-up for Phone Call        was dcd in hospital.  Do you want to restart? Follow-up by: Gladis Riffle, RN,  Sep 27, 2009 1:47 PM  Additional Follow-up for Phone Call Additional follow up Details #1::        no need to restart Additional Follow-up by: Birdie Sons MD,  Sep 27, 2009 2:32 PM    Additional Follow-up for Phone Call Additional follow up Details #2::    see new note from today from pt. Follow-up by: Gladis Riffle, RN,  Sep 28, 2009 11:22 AM

## 2010-06-04 NOTE — Assessment & Plan Note (Signed)
Summary: ADVERSE REACTION TO MEDS RX ON MONDAY/ SHAKES.Amanda KitchenSLM   Vital Signs:  Patient profile:   65 year old female Temp:     98.3 degrees F Pulse rate:   84 / minute BP sitting:   140 / 88  (left arm) Cuff size:   large  Vitals Entered By: Pura Spice, RN (February 15, 2010 2:53 PM) CC: thinks having reaction to septra   History of Present Illness: Here to discuss a possible reaction to Bactrim. She was seen here earlier this week for a cellulitis on the chin from a cosmetic procedure and also a UTI. Her culture grew Klebsiella that is sensitive to bactrim. However in the past few days she has had shaking, weakness, mild SOB, and mild nausea. No rashes or vomitting. Drinking fluids.   Allergies: 1)  ! Sulfa 2)  ! * Peanut Oil 3)  * Shrimp  Past History:  Past Medical History: Reviewed history from 06/21/2009 and no changes required. 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 Hysterectomy  transsphenoidal resection of the pituitary lesion  Review of Systems  The patient denies anorexia, fever, weight loss, weight gain, vision loss, decreased hearing, hoarseness, chest pain, syncope, dyspnea on exertion, peripheral edema, prolonged cough, headaches, hemoptysis, abdominal pain, melena, hematochezia, severe indigestion/heartburn, hematuria, incontinence, genital sores, muscle weakness, suspicious skin lesions, transient blindness, difficulty walking, depression, unusual weight change, abnormal bleeding, enlarged lymph nodes, angioedema, breast masses, and testicular masses.    Physical Exam  General:  Well-developed,well-nourished,in no acute distress; alert,appropriate and cooperative throughout examination Mouth:  red papulopustular rash over the chin,  partially healed  Neck:  No deformities, masses, or tenderness noted. Lungs:  Normal respiratory effort, chest expands symmetrically. Lungs are clear to auscultation, no crackles or wheezes. Heart:  Normal rate and regular rhythm. S1 and S2 normal without gallop, murmur, click, rub or other extra sounds. Abdomen:  Bowel sounds positive,abdomen soft and non-tender without masses, organomegaly or hernias noted.   Impression & Recommendations:  Problem # 1:  ADVERSE DRUG REACTION (ICD-995.20)  Problem # 2:  CELLULITIS, FACE (ICD-682.0)  The following medications were removed from the medication list:    Bactrim Ds 800-160 Mg Tabs (Sulfamethoxazole-trimethoprim) .Amanda Turner... 1 tab by mouth two times a day x 19 days Her updated medication list for this problem includes:    Ciprofloxacin Hcl 500 Mg Tabs (Ciprofloxacin hcl) .Amanda Turner..Amanda Turner Two times a day  Problem # 3:  UTI (ICD-599.0)  The following medications were removed from the medication list:    Bactrim Ds 800-160 Mg Tabs (Sulfamethoxazole-trimethoprim) .Amanda Turner... 1 tab by mouth two times a day x 19 days Her updated medication list for this problem includes:    Ciprofloxacin Hcl 500 Mg Tabs (Ciprofloxacin hcl) .Amanda Turner..Amanda Turner Two times a day  Complete Medication List: 1)  Famotidine 20 Mg Tabs (Famotidine) .... Take 1 tablet by mouth two times a day 2)  Simvastatin 20 Mg Tabs (Simvastatin) .... Take 1 tablet by mouth at bedtime 3)  Warfarin Sodium 5 Mg Tabs (Warfarin sodium) .... Take 1 tablet by mouth once a day as directed 4)  Aspir-low 81 Mg Tbec (Aspirin) .... Once daily 5)  Hydrochlorothiazide 25 Mg Tabs (Hydrochlorothiazide) .... Take 1 tab by mouth every morning 6)  Alprazolam 0.5 Mg Tabs (Alprazolam) .... Take one tab by mouth once daily 7)  Align Caps (Probiotic product) .Amanda KitchenMarland KitchenMarland Turner  1 cap by mouth daily 8)  Ciprofloxacin Hcl 500 Mg Tabs (Ciprofloxacin hcl) .... Two times a day  Patient Instructions: 1)  Stop Bactrim and switch to Cipro for 10 days. This  should take care of the UTI and her rash.  2)  Please schedule a follow-up appointment as needed .  Prescriptions: CIPROFLOXACIN HCL 500 MG TABS (CIPROFLOXACIN HCL) two times a day  #20 x 0   Entered and Authorized by:   Nelwyn Salisbury MD   Signed by:   Nelwyn Salisbury MD on 02/15/2010   Method used:   [facility]        [facility]  [place] [address] [facility]       [address]       [place], [place]  [address]       Ph: [phone] or [phone]       Fax: [phone]   RxID:   [id number]

## 2010-06-04 NOTE — Letter (Signed)
Summary: [facility]  [facility]   Imported By: Maryln Gottron 08/14/2009 13:42:18  _____________________________________________________________________  External Attachment:    Type:   Image     Comment:   External Document

## 2010-06-04 NOTE — Letter (Signed)
[facility]: [facility]   Imported By: Maryln Gottron 06/25/2009 14:46:19  _____________________________________________________________________  External Attachment:    Type:   Image     Comment:   External Document

## 2010-06-04 NOTE — Letter (Signed)
Summary: [facility]   Imported By: Maryln Gottron 06/07/2009 10:28:23  _____________________________________________________________________  External Attachment:    Type:   Image     Comment:   External Document

## 2010-06-04 NOTE — Assessment & Plan Note (Signed)
Summary: 6 week follow up/cjr---[facility] [place] [facility]   Vital Signs:  Patient profile:   65 year old female Weight:      195 pounds Temp:     98.6 degrees F oral Pulse rate:   60 / minute Pulse rhythm:   regular Resp:     12 per minute BP sitting:   118 / 74  (left arm) Cuff size:   regular  Vitals Entered By: Gladis Riffle, RN (August 06, 2009 11:36 AM) CC: 6 week rov--Had CT on 4/1, "another test" tomorrow and EEG 08/08/09 Is Patient Diabetic? No   CC:  6 week rov--Had CT on 4/1 and "another test" tomorrow and EEG 08/08/09.  History of Present Illness:  Follow-Up Visit      This is a 65 year old woman who presents for Follow-up visit.  The patient denies chest pain and palpitations.  Since the last visit the patient notes no new problems or concerns.  The patient reports taking meds as prescribed.  When questioned about possible medication side effects, the patient notes none.  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 Take One Tablet By Mouth Daily 2)  Famotidine 20 Mg Tabs (Famotidine) .... Take 1 Tablet By Mouth Two Times A Day 3)  Simvastatin 20 Mg Tabs (Simvastatin) .... Take 1 Tablet By Mouth At Bedtime 4)  Hydrocortisone 10 Mg Tabs (Hydrocortisone) .... Take 1/2 Tablet By  Mouth Once A Day--On Hold Till 08/18/09 5)  Warfarin Sodium 5 Mg Tabs (Warfarin Sodium) .... Take 1 Tablet By Mouth Once A Day As Directed 6)  Aspir-Low 81 Mg Tbec (Aspirin) .... Once Daily 7)  Baclofen 10 Mg Tabs (Baclofen) .... Take 1 Tablet By Mouth Once A Day  Allergies: 1)  ! 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   Eyes:  pupils equal and pupils round.   Neck:  No deformities, masses, or tenderness noted. Lungs:  normal respiratory effort and no intercostal retractions.   Heart:  normal rate and regular rhythm.   Abdomen:  soft and non-tender.   Msk:  No deformity or scoliosis noted of thoracic or lumbar spine.   Neurologic:  cranial nerves II-XII intact and gait normal.     Impression & Recommendations:  Problem # 1:  CVA (ICD-434.91)  sxs improving Her updated medication  list for this problem includes:    Warfarin Sodium 5 Mg Tabs (Warfarin sodium) .Marland Kitchen... Take 1 tablet by mouth once a day as directed    Aspir-low 81 Mg Tbec (Aspirin) ..... 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 Cyndie Chime  Orders: TLB-CBC Platelet - w/Differential (85025-CBCD)  Complete Medication List: 1)  Citalopram Hydrobromide 40 Mg Tabs (Citalopram hydrobromide) .... Take one tablet by mouth daily 2)  Famotidine 20 Mg Tabs (Famotidine) .... Take 1 tablet by mouth two times a day 3)  Simvastatin 20 Mg Tabs (Simvastatin) .... Take 1 tablet by mouth at bedtime 4)  Hydrocortisone 10 Mg Tabs (Hydrocortisone) .... Take 1/2 tablet by mouth once a day--on hold till 08/18/09 5)  Warfarin Sodium 5 Mg Tabs (Warfarin sodium) .... Take 1 tablet by mouth once a day as directed 6)  Aspir-low 81 Mg Tbec (Aspirin) .... Once daily 7)  Baclofen 10 Mg Tabs (Baclofen) .... Take 1 tablet by mouth once a day  Other Orders: TLB-Lipid Panel (80061-LIPID) TLB-Hepatic/Liver Function Pnl (80076-HEPATIC) TLB-TSH (Thyroid Stimulating Hormone) (21308-MVH)  Patient Instructions: 1)  Please schedule a follow-up appointment in 3 months.

## 2010-06-04 NOTE — Progress Notes (Signed)
Summary: citalopram  Phone Note Call from Patient Call back at Home Phone [phone]   Reason for Call: Refill Medication Summary of Call: Want a little more than 40 mg citalopram called to [facility] [place].  I need to take a little more.  That way I won't have to take Xanax.   Initial call taken by: Rudy Jew, RN,  Sep 28, 2009 3:47 PM  Follow-up for Phone Call        increase to 1.5 pills daily Follow-up by: Birdie Sons MD,  Sep 28, 2009 4:46 PM    New/Updated Medications: CITALOPRAM HYDROBROMIDE 40 MG TABS (CITALOPRAM HYDROBROMIDE) take one and 1/2  tablets by mouth daily Prescriptions: CITALOPRAM HYDROBROMIDE 40 MG TABS (CITALOPRAM HYDROBROMIDE) take one and 1/2  tablets by mouth daily  #45 x 5   Entered by:   Rudy Jew, RN   Authorized by:   Birdie Sons MD   Signed by:   Rudy Jew, RN on 09/28/2009   Method used:   [facility]        [facility]  [place] [address] [facility]       [address]       [place], [place]  [address]       Ph: [phone] or [phone]       Fax: [phone]   RxID:   [id number]

## 2010-06-04 NOTE — Progress Notes (Signed)
Summary: pt request  Phone Note Call from Patient Call back at Home Phone [phone]   Caller: Patient live phone call Call For: Birdie Sons MD Summary of Call: Has several DDS appts next week for deep cleaning and root canal and would like #[facility] [place]. Initial call taken by: Gladis Riffle, RN,  November 08, 2009 11:57 AM  Follow-up for Phone Call        ok take two times a day as needed 0 refills Follow-up by: Birdie Sons MD,  November 08, 2009 4:32 PM    New/Updated Medications: ALPRAZOLAM 0.5 MG TABS (ALPRAZOLAM) take one tab by mouth once daily Prescriptions: ALPRAZOLAM 0.5 MG TABS (ALPRAZOLAM) take one tab by mouth once daily  #10 x 0   Entered by:   Kern Reap CMA (AAMA)   Authorized by:   Birdie Sons MD   Signed by:   Kern Reap CMA (AAMA) on 11/08/2009   Method used:   Telephoned to ...       [facility]  [place] [address] ([facility]ail[facility]       [address]       [place], [place]  [address]       Ph: [phone] or [phone]       Fax: [phone]   RxID:   [id number]

## 2010-06-04 NOTE — Progress Notes (Signed)
Summary: HCTZ question  Phone Note Call from Patient Call back at Home Phone [phone]   Caller: Spouse Call For: swords Summary of Call: pt's husband called and explain to me that she had a stroke and I needed to start talking to him.  He said that Dr Horald Pollen has already put her on a potassium pill but their question was about the HCTZ and whether she should off of it. Initial call taken by: Alfred Levins, CMA,  March 14, 2010 11:47 AM  Follow-up for Phone Call        stay on meds Follow-up by: Birdie Sons MD,  March 14, 2010 4:18 PM  Additional Follow-up for Phone Call Additional follow up Details #1::        pt's husband aware, getting labs done on Monday at Dr Janus Molder office Additional Follow-up by: Alfred Levins, CMA,  March 15, 2010 9:06 AM

## 2010-06-04 NOTE — Progress Notes (Signed)
Summary: Xanax--denied  Phone Note Call from Patient   Caller: Patient Call For: Birdie Sons MD Summary of Call: Pt is asking for [facility] [phone] Initial call taken by: Lynann Beaver CMA,  Sep 27, 2009 12:43 PM  Follow-up for Phone Call        no xanax.  Continue citalopram per dr swords. Follow-up by: Gladis Riffle, RN,  Sep 28, 2009 12:20 PM  Additional Follow-up for Phone Call Additional follow up Details #1::        LEFT MESSAGE ON HOME PHONE. Additional Follow-up by: Gladis Riffle, RN,  Sep 28, 2009 2:17 PM

## 2010-06-04 NOTE — Progress Notes (Signed)
Summary: request  Phone Note Call from Patient Call back at Home Phone [phone]   Caller: Patient Summary of Call: Pt called and said that her feet are really swollen. [facility] [place]. Pt said that she left a vm on nurses phone.  Initial call taken by: Lucy Antigua,  August 27, 2009 4:50 PM  Follow-up for Phone Call        Is not on med list.  Please advise.Gladis Riffle, RN  August 28, 2009 8:11 AM   Additional Follow-up for Phone Call Additional follow up Details #1::        see me tomorrow a.m at 9  scheduled. Lynann Beaver [facility]  August 28, 2009 4:41 PM  Additional Follow-up by: Birdie Sons MD,  August 28, 2009 4:11 PM

## 2010-06-04 NOTE — Assessment & Plan Note (Signed)
Summary: edema/dm/pt rescd//ccm   Vital Signs:  Patient profile:   65 year old female Weight:      200 pounds Temp:     98.1 degrees F oral Pulse rate:   72 / minute Pulse rhythm:   regular Resp:     12 per minute BP sitting:   102 / 66  (left arm) Cuff size:   regular  Vitals Entered By: Gladis Riffle, RN (August 29, 2009 11:50 AM) CC: c/o edema x 2 weeks, requests HCTZ Is Patient Diabetic? No   CC:  c/o edema x 2 weeks and requests HCTZ.  History of Present Illness: she has noted some edema of both ankles---related to warm weather no SOB no new meds she says swelling can be severe duratoin 3-4 weeeks no other associated symptoms  All other systems reviewed and were negative   Preventive Screening-Counseling & Management  Alcohol-Tobacco     Smoking Status: never  Current Medications (verified): 1)  Citalopram Hydrobromide 40 Mg Tabs (Citalopram Hydrobromide) .... Take One Tablet By Mouth Daily 2)  Famotidine 20 Mg Tabs (Famotidine) .... Take 1 Tablet By Mouth Two Times A Day 3)  Simvastatin 20 Mg Tabs (Simvastatin) .... Take 1 Tablet By Mouth At Bedtime 4)  Hydrocortisone 10 Mg Tabs (Hydrocortisone) .... Take 1/2 Tablet By Mouth Once A Day--On Hold Till 08/18/09 5)  Warfarin Sodium 5 Mg Tabs (Warfarin Sodium) .... Take 1 Tablet By Mouth Once A Day As Directed 6)  Aspir-Low 81 Mg Tbec (Aspirin) .... Once Daily 7)  Baclofen 10 Mg Tabs (Baclofen) .... Take 2 Tablets By Mouth Once A Day  Allergies: 1)  ! 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---), related to hypercoagulable state  Past Surgical History: Last updated: 06/21/2009 Hysterectomy  transsphenoidal resection of the pituitary lesion  Social History: Last updated: 07/07/2007 Married Never Smoked Regular  exercise-no Alcohol use-no 2 healthy children  Risk Factors: Exercise: no (03/30/2007)  Risk Factors: Smoking Status: never (08/29/2009)  Review of Systems       All other systems reviewed and were negative   Physical Exam  General:  Well-developed,well-nourished,in no acute distress; alert,appropriate and cooperative throughout examination Head:  normocephalic and atraumatic.   Neck:  No deformities, masses, or tenderness noted. Lungs:  normal respiratory effort and no intercostal retractions.   Abdomen:  soft and non-tender.   Extremities:  trace left pedal edema and trace right pedal edema.     Impression & Recommendations:  Problem # 1:  HYPERTENSION (ICD-401.9)  BP ok  BP today: 102/66 Prior BP: 118/74 (08/06/2009)  Labs Reviewed: K+: 3.8 (08/06/2009) Creat: : 0.7 (08/06/2009)   Chol: 195 (08/06/2009)   HDL: 76.10 (08/06/2009)   LDL: 98 (08/06/2009)   TG: 105.0 (08/06/2009)  Her updated medication list for this problem includes:    Hydrochlorothiazide 25 Mg Tabs (Hydrochlorothiazide) .Marland Kitchen... Take 1 tab by mouth every morning  Problem # 2:  EDEMA (ICD-782.3) sxs today are minimal she reports worse swelling on most days I'm ok with HCTZ---will need to follow electrolytes Her updated medication list for this problem includes:    Hydrochlorothiazide 25 Mg Tabs (Hydrochlorothiazide) .Marland Kitchen... Take 1 tab by mouth every morning  Complete Medication List: 1)  Citalopram Hydrobromide 40 Mg Tabs (Citalopram hydrobromide) .... Take one tablet by mouth daily 2)  Famotidine 20 Mg Tabs (Famotidine) .... Take  1 tablet by mouth two times a day 3)  Simvastatin 20 Mg Tabs (Simvastatin) .... Take 1 tablet by mouth at bedtime 4)  Hydrocortisone 10 Mg Tabs (Hydrocortisone) .... Take 1/2 tablet by mouth once a day--on hold till 08/18/09 5)  Warfarin Sodium 5 Mg Tabs (Warfarin sodium) .... Take 1 tablet by mouth once a day as directed 6)  Aspir-low 81 Mg Tbec (Aspirin) .... Once daily 7)   Baclofen 10 Mg Tabs (Baclofen) .... Take 2 tablets by mouth once a day 8)  Hydrochlorothiazide 25 Mg Tabs (Hydrochlorothiazide) .... Take 1 tab by mouth every morning  Other Orders: Speech Therapy (Speech Therapy) Prescriptions: HYDROCHLOROTHIAZIDE 25 MG  TABS (HYDROCHLOROTHIAZIDE) Take 1 tab by mouth every morning  #30 x 3   Entered and Authorized by:   Birdie Sons MD   Signed by:   Birdie Sons MD on 08/29/2009   Method used:   [facility]  [place] [address]       [address]       [place], [place]  [address]       Ph: [phone] or [phone]       Fax: [phone]   RxID:   [id number]

## 2010-06-04 NOTE — Letter (Signed)
Summary: [facility]  [facility]   Imported By: Maryln Gottron 07/16/2009 14:39:36  _____________________________________________________________________  External Attachment:    Type:   Image     Comment:   External Document

## 2010-06-04 NOTE — Progress Notes (Signed)
Summary: CITALOPRAM HYDROBROMIDE  Phone Note Refill Request Message from:  [facility] on October 05, 2009 10:25 AM  Refills Requested: Medication #1:  CITALOPRAM HYDROBROMIDE 40 MG TABS take one and 1/2  tablets by mouth daily fax request asking to decrease Citalopram to 20mg , please see from in your inbox.   Method Requested: Telephone to Pharmacy Initial call taken by: Mervin Hack CMA [facility]ull),  October 05, 2009 10:26 AM  Follow-up for Phone Call        spoke with patient and now she states that 40mg  1 1/2 tab is too much, it made her want to take the xanax. She would like to take 30mg  if possible, please advise. DeShannon Katrinka Blazing CMA [facility])  October 05, 2009 10:33 AM   patient called back and she will now take 20mg  once daily, pt has 5 refills so she has enough to last. Rx corrected. Follow-up by: Mervin Hack CMA (AAMA),  October 05, 2009 11:09 AM    New/Updated Medications: CITALOPRAM HYDROBROMIDE 40 MG TABS (CITALOPRAM HYDROBROMIDE) Take 1/2 by mouth once daily

## 2010-06-04 NOTE — Progress Notes (Signed)
  Phone Note Call from Patient Call back at Home Phone [phone]   Caller: Patient Call For: Birdie Sons MD Summary of Call: Mood swings are worse.  Wants to go back on Citalopram 40 mg.  Is also checking into seeing a pyschologist. Initial call taken by: [facility] CMA AAMA,  March 20, 2010 11:15 AM  Follow-up for Phone Call        stay on lower dose citalopram. Refer to psychiatry. Follow-up by: Birdie Sons MD,  March 20, 2010 11:34 AM  Additional Follow-up for Phone Call Additional follow up Details #1::        what dose? psychiatry or pyschology? Additional Follow-up by: Lynann Beaver CMA AAMA,  March 20, 2010 1:19 PM    Additional Follow-up for Phone Call Additional follow up Details #2::    citalopram 20 mg p.o. q. day Follow-up by: Birdie Sons MD,  March 22, 2010 9:08 AM  Additional Follow-up for Phone Call Additional follow up Details #3:: Details for Additional Follow-up Action Taken: Notified husband. Additional Follow-up by: Lynann Beaver CMA AAMA,  March 22, 2010 9:20 AM

## 2010-06-04 NOTE — Progress Notes (Signed)
  Phone Note From Pharmacy   [facility]  [place] [phone] Call For: Dr. Cato Mulligan  Summary of Call: Confirmed Citalopram dose for pharmacy 40 mg. 1/1/2 daily. Initial call taken by: Lynann Beaver CMA,  January 14, 2010 2:44 PM    New/Updated Medications: CITALOPRAM HYDROBROMIDE 40 MG TABS (CITALOPRAM HYDROBROMIDE) Take 1  1/2 by mouth once daily  Appended Document:  Pt calls back stating she has been taking 1/2 Citalopram, but is interesting in taking one by mouth daily.  Advised that this would be fine.  Appended Document:  Needs new prescription called to the pharmacy.

## 2010-06-04 NOTE — Letter (Signed)
Summary: [facility]  [facility]   Imported By: Maryln Gottron 03/20/2010 11:21:17  _____________________________________________________________________  External Attachment:    Type:   Image     Comment:   External Document

## 2010-06-04 NOTE — Letter (Signed)
[facility]: [facility]   Imported By: Maryln Gottron 09/27/2009 13:36:32  _____________________________________________________________________  External Attachment:    Type:   Image     Comment:   External Document

## 2010-06-04 NOTE — Assessment & Plan Note (Signed)
Summary: roa/fup/rcd   Vital Signs:  Patient profile:   65 year old female Weight:      208 pounds Temp:     98.6 degrees F oral BP sitting:   110 / 70  (left arm) Cuff size:   large  Vitals Entered By: Sid Falcon LPN (February 05, 2010 11:42 AM)  History of Present Illness:  Follow-Up Visit      This is a 65 year old woman who presents for Follow-up visit.  The patient denies chest pain and palpitations.  Since the last visit the patient notes no new problems or concerns- she continues to recover from stroke. Speech is improving.  The patient reports taking meds as prescribed.  When questioned about possible medication side effects, the patient notes none.   Mood disorder: started citalopram-she says that her speech gets worse. 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 Take 1 By Mouth Once Daily 2)  Famotidine 20 Mg Tabs (Famotidine) .... Take 1 Tablet By Mouth Two Times A Day 3)  Simvastatin 20 Mg Tabs (Simvastatin) .... Take 1 Tablet By Mouth At Bedtime 4)  Hydrocortisone 10 Mg Tabs (Hydrocortisone) .... Take 1/2 Tablet By Mouth Once A Day 5)  Warfarin Sodium 5 Mg Tabs (Warfarin Sodium) .... Take 1 Tablet By Mouth Once A Day As Directed 6)  Aspir-Low 81 Mg Tbec (Aspirin) .... Once Daily 7)  Hydrochlorothiazide 25 Mg  Tabs (Hydrochlorothiazide) .... Take 1 Tab By Mouth Every Morning 8)  Alprazolam 0.5 Mg Tabs (Alprazolam) .... Take One Tab By  Mouth Once Daily  Allergies (verified): 1)  ! 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   Head:  normocephalic and atraumatic.   Eyes:  pupils equal and pupils round.   Neck:  No deformities, masses, or tenderness noted. Lungs:  normal respiratory effort and no intercostal retractions.   Heart:  normal rate and regular rhythm.   Abdomen:  soft and non-tender.   overweight Msk:  No deformity or scoliosis noted of thoracic or lumbar spine.   Neurologic:  cranial nerves II-XII intact except speech is somewhat slow and deliberate    Impression & Recommendations:  Problem # 1:  CVA (ICD-434.91)  Her updated medication list for this problem includes:    Warfarin Sodium 5 Mg Tabs (Warfarin sodium) .Marland Kitchen... Take 1 tablet by mouth once a day as directed    Aspir-low 81 Mg Tbec (Aspirin) ..... Once daily  Complete Medication List: 1)  Famotidine 20 Mg Tabs (Famotidine) .... Take 1 tablet  by mouth two times a day 2)  Simvastatin 20 Mg Tabs (Simvastatin) .... Take 1 tablet by mouth at bedtime 3)  Hydrocortisone 10 Mg Tabs (Hydrocortisone) .... Take 1/2 tablet by mouth once a day 4)  Warfarin Sodium 5 Mg Tabs (Warfarin sodium) .... Take 1 tablet by mouth once a day as directed 5)  Aspir-low 81 Mg Tbec (Aspirin) .... Once daily 6)  Hydrochlorothiazide 25 Mg Tabs  (Hydrochlorothiazide) .... Take 1 tab by mouth every morning 7)  Alprazolam 0.5 Mg Tabs (Alprazolam) .... Take one tab by mouth once daily  Other Orders: Flu Vaccine 55yrs + MEDICARE PATIENTS (Z6109) Administration Flu vaccine - MCR (U0454)  Patient Instructions: 1)  See me 6 weeks 2)  taper off the citalopram: 1/2 pill by mouth once daily for 7 days and then 1/2 pill by mouth every other day for 7 days and then stop medication   Flu Vaccine Consent Questions     Do you have a history of severe allergic reactions to this vaccine? no    Any prior history of allergic reactions to egg and/or gelatin? no    Do you have a sensitivity to the preservative Thimersol? no    Do you have a past history of Guillan-Barre Syndrome? no    Do you currently have an acute febrile illness? no    Have you ever had a severe reaction to latex? no    Vaccine information given and explained to patient? yes    Are you currently pregnant? no    Lot Number:[id number]   Exp Date:11/02/2010   Site Given  Left Deltoid IMdflu

## 2010-06-04 NOTE — Assessment & Plan Note (Signed)
Summary: sore in and around mouth//lch   Vital Signs:  Patient profile:   65 year old female Height:      64 inches (162.56 cm) Weight:      206 pounds (93.64 kg) O2 Sat:      98 % on Room air Temp:     98.4 degrees F (36.89 degrees C) oral Pulse rate:   67 / minute BP sitting:   118 / 72  (left arm) Cuff size:   large  Vitals Entered By: Josph Macho RMA (February 11, 2010 12:14 PM)  O2 Flow:  Room air CC: Sore in and around mouth X4 days- itching- pt states her whole body is itching/ pt states she is having UTI symptoms/ CF Is Patient Diabetic? No   History of Present Illness: Patient in today for persistent irritated, pruritic lesions around her mouth. She had her face waxed around her mouth right before this started and it was inflamed as soon as she finished now she has pustular lesions on her chin now. She also has a history of recurrent UTIs and has been struggling with dysuria and low grade f/c over the past couple of days. Mild suprapubic discomfort has been noted. No back pain/malaise/CP/palp/SOB.  Current Medications (verified): 1)  Famotidine 20 Mg Tabs (Famotidine) .... Take 1 Tablet By Mouth Two Times A Day 2)  Simvastatin 20 Mg Tabs (Simvastatin) .... Take 1 Tablet By Mouth At Bedtime 3)  Hydrocortisone 10 Mg Tabs (Hydrocortisone) .... Take 1/2 Tablet By Mouth Once A Day 4)  Warfarin Sodium 5 Mg Tabs (Warfarin Sodium) .... Take 1 Tablet By Mouth Once A Day As Directed 5)  Aspir-Low 81 Mg Tbec (Aspirin) .... Once Daily 6)  Hydrochlorothiazide 25 Mg  Tabs (Hydrochlorothiazide) .... Take 1 Tab By Mouth Every Morning 7)  Alprazolam 0.5 Mg Tabs (Alprazolam) .... Take One Tab By Mouth Once Daily  Allergies (verified): 1)  ! * Peanut Oil 2)  * Shrimp  Past History:  Past medical history reviewed for relevance to current acute and chronic problems. Social history (including risk factors) reviewed for relevance to current acute and chronic problems.  Past Medical  History: Reviewed history from 06/21/2009 and no changes required. 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 Married Never Smoked Regular exercise-no Alcohol use-no 2 healthy children  Physical Exam  General:  Well-developed,well-nourished,in no acute distress; alert,appropriate and cooperative throughout examination Head:  Normocephalic and atraumatic without obvious abnormalities. No apparent alopecia or balding. Mouth:  Oral mucosa and oropharynx without lesions or exudates.  Teeth in good repair. Neck:  No deformities, masses, or tenderness noted. Lungs:  Normal respiratory effort, chest expands symmetrically. Lungs are clear to auscultation, no crackles or wheezes. Heart:  Normal rate and regular rhythm. S1 and S2 normal without gallop, murmur, click, rub or other extra sounds. Abdomen:  Bowel sounds positive,abdomen soft and non-tender without masses, organomegaly or hernias noted. Obese Extremities:  No clubbing, cyanosis, edema, or deformity noted with normal full range of motion of all joints.   Skin:  Scattered pustular lesions on chin with erythematous base.  Cervical Nodes:  No lymphadenopathy noted Psych:  Cognition and judgment appear intact. Alert and cooperative with normal attention span and concentration. No apparent delusions, illusions, hallucinations   Impression & Recommendations:  Problem # 1:  HIDRADENITIS SUPPURATIVA (ICD-705.83) Started on Bactrim DS by mouth  two times a day x 10 days and notify Coumadin clinic with next blood draw in next few weeks. Use Cetaphil soap to wash daily, start a probiotic  Problem # 2:  UTI (ICD-599.0)  Her updated medication list for this problem includes:    Bactrim Ds 800-160 Mg Tabs  (Sulfamethoxazole-trimethoprim) .Marland Kitchen... 1 tab by mouth two times a day x 19 days  Orders: UA Dipstick w/o Micro (automated)  (81003) Push clear fluids  Problem # 3:  PRIMARY HYPERCOAGULABLE STATE (ICD-289.81) Chronic coumadin therapy  Complete Medication List: 1)  Famotidine 20 Mg Tabs (Famotidine) .... Take 1 tablet by mouth two times a day 2)  Simvastatin 20 Mg Tabs (Simvastatin) .... Take 1 tablet by mouth at bedtime 3)  Warfarin Sodium 5 Mg Tabs (Warfarin sodium) .... Take 1 tablet by mouth once a day as directed 4)  Aspir-low 81 Mg Tbec (Aspirin) .... Once daily 5)  Hydrochlorothiazide 25 Mg Tabs (Hydrochlorothiazide) .... Take 1 tab by mouth every morning 6)  Alprazolam 0.5 Mg Tabs (Alprazolam) .... Take one tab by mouth once daily 7)  Bactrim Ds 800-160 Mg Tabs (Sulfamethoxazole-trimethoprim) .Marland Kitchen.. 1 tab by mouth two times a day x 19 days 8)  Align Caps (Probiotic product) .Marland Kitchen.. 1 cap by mouth daily  Patient Instructions: 1)  Drink plenty of fluids up to 3-4 quarts a day. Cranberry juice is especially recommended in addition to large amounts of water. Avoid caffeine & carbonated drinks, they tend to irritate the bladder, Return in 3-5 days if you're not better: sooner if you're feeling worse.  2)  Take your antibiotic as prescribed until ALL of it is gone, but stop if you develop a rash or swelling and contact our office as soon as possible.  3)  Please schedule a follow-up appointment as needed if symptoms worsen or do not improve. 4)  Wash the skin with Cetaphil soap daily to help with the staph infections 5)  For constipation add Benefiber 2 tsp in Activia yogurt daily, maintain goody hydration. If day 3 comes and no BM then try Milk of Magnesia 2 tbls if no BM after 6 hours repeat in warm prune juice and use a Dulcolax suppository per rectum at the same time. Can add a stool softener such as Senna S 1-2 caps daily  Prescriptions: ALIGN  CAPS (PROBIOTIC PRODUCT) 1 cap by mouth daily   #30 x 1   Entered and Authorized by:   Danise Edge MD   Signed by:   Danise Edge MD on 02/11/2010   Method used:   Historical   RxID:   [id number] BACTRIM DS 800-160 MG TABS (SULFAMETHOXAZOLE-TRIMETHOPRIM) 1 tab by mouth two times a day x 19 days  #20 x 0   Entered and Authorized by:   Danise Edge MD   Signed by:   Danise Edge MD on 02/11/2010   Method used:   [facility]        [facility]  [place] [address] [facility]       [address]       [place], [place]  [address]       Ph: [phone] or [phone]       Fax: [phone]   RxID:   [id number]   Appended Document: sore in and around mouth//lch     Clinical Lists Changes  Problems: Added new problem of HEMATURIA UNSPECIFIED (ICD-599.70) - Signed Orders: Added new Test order of T-Urine Culture (Spectrum Order) [id number]) - Signed Observations: Added new  observation of PH URINE: 7.0  (02/11/2010 13:52) Added new observation of SPEC GR URIN: 1.015  (02/11/2010 13:52) Added new observation of APPEARANCE U: Cloudy  (02/11/2010 13:52) Added new observation of UA COLOR: yellow  (02/11/2010 13:52) Added new observation of WBC DIPSTK U: trace  (02/11/2010 13:52) Added new observation of NITRITE URN: positive  (02/11/2010 13:52) Added new observation of UROBILINOGEN: 0.2  (02/11/2010 13:52) Added new observation of PROTEIN, URN: negative  (02/11/2010 13:52) Added new observation of BLOOD UR DIP: 2+  (02/11/2010 13:52) Added new observation of KETONES URN: negative  (02/11/2010 13:52) Added new observation of BILIRUBIN UR: negative  (02/11/2010 13:52) Added new observation of GLUCOSE, URN: negative  (02/11/2010 13:52)      Laboratory Results   Urine Tests    Routine Urinalysis   Color: yellow Appearance: Cloudy Glucose: negative   (Normal Range: Negative) Bilirubin: negative   (Normal Range: Negative) Ketone: negative   (Normal Range: Negative) Spec. Gravity: 1.015   (Normal Range:  1.003-1.035) Blood: 2+   (Normal Range: Negative) pH: 7.0   (Normal Range: 5.0-8.0) Protein: negative   (Normal Range: Negative) Urobilinogen: 0.2   (Normal Range: 0-1) Nitrite: positive   (Normal Range: Negative) Leukocyte Esterace: trace   (Normal Range: Negative)

## 2010-06-04 NOTE — Letter (Signed)
Summary: [facility]  [facility]   Imported By: Maryln Gottron 08/31/2009 12:52:05  _____________________________________________________________________  External Attachment:    Type:   Image     Comment:   External Document

## 2010-06-04 NOTE — Progress Notes (Signed)
Summary: diuretic question  Phone Note Call from Patient Call back at Home Phone [phone]   Caller: Patient Call For: Swords Summary of Call: pts potassium was 28 and Dr Horald Pollen told her to find out from you if she needed to stop taking the diurectic or cut the pill in half. please advise Initial call taken by: Alfred Levins, CMA,  March 11, 2010 4:33 PM  Follow-up for Phone Call        get laboratory work from Dr. Romero Belling for my review. Potassium level of 28 does not make sense. Follow-up by: Birdie Sons MD,  March 11, 2010 7:22 PM  Additional Follow-up for Phone Call Additional follow up Details #1::        Sorry I meant 2.8 Additional Follow-up by: Alfred Levins, CMA,  March 12, 2010 1:07 PM    Additional Follow-up for Phone Call Additional follow up Details #2::    Labs received from Dr Janus Molder office Follow-up by: Alfred Levins, CMA,  March 12, 2010 4:39 PM  Additional Follow-up for Phone Call Additional follow up Details #3:: Details for Additional Follow-up Action Taken: Start Kdur by mouth two times a day.  check bmet tomorrow see me 2 weeks Additional Follow-up by: Birdie Sons MD,  March 13, 2010 1:25 PM  New/Updated Medications: KLOR-CON M20 20 MEQ CR-TABS (POTASSIUM CHLORIDE CRYS CR) 1 by mouth two times a day Prescriptions: KLOR-CON M20 20 MEQ CR-TABS (POTASSIUM CHLORIDE CRYS CR) 1 by mouth two times a day  #60 x 5   Entered by:   Alfred Levins, CMA   Authorized by:   Birdie Sons MD   Signed by:   Alfred Levins, CMA on 03/14/2010   Method used:   [facility]        [facility]  [place] [address]       [address]       [place], [place]  [address]       Ph: [phone] or [phone]       Fax: [phone]   RxID:   [id number]  appt scheduled, rx called in, pt aware  Alfred Levins, CMA  March 14, 2010 11:31 AM

## 2010-06-04 NOTE — Letter (Signed)
Summary: [facility]  [facility]   Imported By: Maryln Gottron 06/07/2009 10:37:18  _____________________________________________________________________  External Attachment:    Type:   Image     Comment:   External Document

## 2010-06-04 NOTE — Assessment & Plan Note (Signed)
Summary: 3 month rov/njr   Vital Signs:  Patient profile:   65 year old female Weight:      202 pounds BMI:     34.80 Temp:     98.3 degrees F oral Pulse rate:   66 / minute Pulse rhythm:   regular Resp:     12 per minute BP sitting:   122 / 78  (left arm) Cuff size:   regular  Vitals Entered By: Gladis Riffle, RN (November 06, 2009 11:39 AM) CC: 3 month rov-- Is Patient Diabetic? No   CC:  3 month rov--.  History of Present Illness:  Follow-Up Visit      This is a 65 year old woman who presents for Follow-up visit.  The patient denies chest pain and palpitations.  Since the last visit the patient notes no new problems or concerns--she continues to have word finding difficulties.  The patient reports taking meds as prescribed.  When questioned about possible medication side effects, the patient notes none.    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 Take 1/2 By Mouth Once Daily 2)  Famotidine 20 Mg Tabs (Famotidine) .... Take 1 Tablet By Mouth Two Times A Day 3)  Simvastatin 20 Mg Tabs (Simvastatin) .... Take 1 Tablet By Mouth At Bedtime 4)  Hydrocortisone 10 Mg Tabs (Hydrocortisone) .... Take 1/2 Tablet By Mouth Once A Day--On Hold Till 08/18/09 5)  Warfarin Sodium 5 Mg Tabs (Warfarin Sodium) .... Take 1 Tablet By Mouth Once A Day As Directed 6)  Aspir-Low 81 Mg Tbec (Aspirin) .... Once Daily 7)  Baclofen 10 Mg Tabs (Baclofen) .... Take 2 Tablets  By Mouth Once A Day 8)  Hydrochlorothiazide 25 Mg  Tabs (Hydrochlorothiazide) .... Take 1 Tab By Mouth Every Morning  Allergies: 1)  ! * Peanut Oil 2)  * Shrimp  Physical Exam  General:  alert and well-developed.   Head:  normocephalic and atraumatic.   Eyes:  pupils equal and pupils round.   Ears:  R ear normal and L ear normal.   Neck:  No deformities, masses, or tenderness noted. Lungs:  normal respiratory effort and no intercostal retractions.   Heart:  normal rate and regular rhythm.   Abdomen:  soft and non-tender.   Msk:  No deformity or scoliosis noted of thoracic or lumbar spine.   Neurologic:  cranial nerves II-XII intact and gait normal.   occasional slow speech and word finding difficulty   Impression & Recommendations:  Problem # 1:  CVA (ICD-434.91) still with word finding difficulties has had speech therapy Her updated medication list for this problem includes:    Warfarin Sodium 5 Mg Tabs (Warfarin sodium) .Marland Kitchen... Take 1 tablet by mouth once a day as directed    Aspir-low 81 Mg Tbec (Aspirin) ..... Once daily  Problem # 2:  HYPERTENSION (ICD-401.9) controlled continue current medications  Her updated medication list for this problem includes:    Hydrochlorothiazide 25 Mg Tabs (Hydrochlorothiazide) .Marland Kitchen... Take 1 tab by mouth  every morning  BP today: 122/78 Prior BP: 102/66 (08/29/2009)  Labs Reviewed: K+: 3.8 (08/06/2009) Creat: : 0.7 (08/06/2009)   Chol: 195 (08/06/2009)   HDL: 76.10 (08/06/2009)   LDL: 98 (08/06/2009)   TG: 105.0 (08/06/2009)  Problem # 3:  HYPERLIPIDEMIA (ICD-272.4) controlled continue current medications  Her updated medication list for this problem includes:    Simvastatin 20 Mg Tabs (Simvastatin) .Marland Kitchen... Take 1 tablet by mouth at bedtime  Labs Reviewed: SGOT: 17 (08/06/2009)   SGPT: 25 (08/06/2009)   HDL:76.10 (08/06/2009), 56.3 (08/18/2006)  LDL:98 (08/06/2009), DEL (60/45/4098)  Chol:195 (08/06/2009), 203 (08/18/2006)  Trig:105.0  (08/06/2009), 114 (08/18/2006)  Problem # 4:  D/O OPTIC CHIASM ASSOC W/PITUITARY NEOPLASMS&D/O (ICD-377.51)  f/u with neurosurgery  Complete Medication List: 1)  Citalopram Hydrobromide 40 Mg Tabs (Citalopram hydrobromide) .... Take 1/2 by mouth once daily 2)  Famotidine 20 Mg Tabs (Famotidine) .... Take 1 tablet by mouth two times a day 3)  Simvastatin 20 Mg Tabs (Simvastatin) .... Take 1 tablet by mouth at bedtime 4)  Hydrocortisone 10 Mg Tabs (Hydrocortisone) .... Take 1/2 tablet by mouth once a day 5)  Warfarin Sodium 5 Mg Tabs (Warfarin sodium) .... Take 1 tablet by mouth once a day as directed 6)  Aspir-low 81 Mg Tbec (Aspirin) .... Once daily 7)  Hydrochlorothiazide 25 Mg Tabs (Hydrochlorothiazide) .... Take 1 tab by mouth every morning  Patient Instructions: 1)  Please schedule a follow-up appointment in 3 months.

## 2010-06-04 NOTE — Letter (Signed)
[facility]   Imported By: Maryln Gottron 06/07/2009 10:26:57  _____________________________________________________________________  External Attachment:    Type:   Image     Comment:   External Document

## 2010-06-04 NOTE — Letter (Signed)
Summary: [facility]  [facility]   Imported By: Maryln Gottron 03/27/2010 10:00:29  _____________________________________________________________________  External Attachment:    Type:   Image     Comment:   External Document

## 2010-06-04 NOTE — Progress Notes (Signed)
Summary: Pt returning call re: referral  Phone Note Call from Patient Call back at Home Phone [phone]   Caller: Patient Summary of Call: Pt returning call from [place] re: referral. Please call back at earliest convenience.  Initial call taken by: Lucy Antigua,  August 29, 2009 4:36 PM  Follow-up for Phone Call        Returned call - pt aware. Follow-up by: Corky Mull,  August 29, 2009 4:45 PM

## 2010-06-04 NOTE — Progress Notes (Signed)
Summary: xanax refill  Phone Note Call from Patient Call back at Home Phone [phone]   Caller: vm Summary of Call: Requesting Xanax refill #10, having MRI, to dentist, husband not well.  Need it to help me through these.   Initial call taken by: Rudy Jew, RN,  January 28, 2010 12:57 PM  Follow-up for Phone Call        pt call back concerning med refill Follow-up by: Heron Sabins,  January 29, 2010 10:15 AM  Additional Follow-up for Phone Call Additional follow up Details #1::        ok x 1 Additional Follow-up by: Birdie Sons MD,  January 29, 2010 1:24 PM    Prescriptions: ALPRAZOLAM 0.5 MG TABS (ALPRAZOLAM) take one tab by mouth once daily  #10 x 0   Entered by:   Rudy Jew, RN   Authorized by:   Birdie Sons MD   Signed by:   Rudy Jew, RN on 01/30/2010   Method used:   Telephoned to ...       [facility]  [place] [address] (retail)       [address]       [place], [place]  [address]       Ph: [phone] or [phone]       Fax: [phone]   RxID:   [id number]

## 2010-06-04 NOTE — Letter (Signed)
Summary: [facility]   Imported By: Maryln Gottron 06/04/2009 11:09:39  _____________________________________________________________________  External Attachment:    Type:   Image     Comment:   External Document

## 2010-06-04 NOTE — Letter (Signed)
[facility]   Imported By: Maryln Gottron 12/19/2009 12:54:18  _____________________________________________________________________  External Attachment:    Type:   Image     Comment:   External Document

## 2010-06-06 NOTE — Progress Notes (Signed)
  Phone Note Call from Patient Call back at Home Phone [phone]   Caller: Patient Call For: Birdie Sons MD Summary of Call: Pt is calling to ask if she can take a 25 mg of HCTZ Mg daily as she has "total body:" swelling.  Is still on the potassium. She is taking 12.5 mg of HCTZ right now. Initial call taken by: Lynann Beaver CMA AAMA,  May 08, 2010 12:31 PM  Follow-up for Phone Call        either is o.k. Follow-up by: Birdie Sons MD,  May 08, 2010 3:45 PM  Additional Follow-up for Phone Call Additional follow up Details #1::        Notified pt. Additional Follow-up by: Lynann Beaver CMA AAMA,  May 08, 2010 4:13 PM

## 2010-06-19 ENCOUNTER — Other Ambulatory Visit: Payer: Self-pay | Admitting: *Deleted

## 2010-06-19 MED ORDER — ALPRAZOLAM 0.5 MG PO TABS: 0.5000 mg | ORAL_TABLET | Freq: Every day | ORAL | Status: DC

## 2010-06-25 ENCOUNTER — Encounter (HOSPITAL_BASED_OUTPATIENT_CLINIC_OR_DEPARTMENT_OTHER): Payer: MEDICARE | Admitting: Oncology

## 2010-06-25 ENCOUNTER — Other Ambulatory Visit: Payer: Self-pay | Admitting: Oncology

## 2010-06-25 DIAGNOSIS — Z7901 Long term (current) use of anticoagulants: Secondary | ICD-10-CM

## 2010-06-25 DIAGNOSIS — D6859 Other primary thrombophilia: Secondary | ICD-10-CM

## 2010-06-25 LAB — PROTIME-INR: INR: 3.1 (ref 2.00–3.50)

## 2010-07-09 ENCOUNTER — Other Ambulatory Visit: Payer: Self-pay | Admitting: Oncology

## 2010-07-09 ENCOUNTER — Encounter (HOSPITAL_BASED_OUTPATIENT_CLINIC_OR_DEPARTMENT_OTHER): Payer: MEDICARE | Admitting: Oncology

## 2010-07-09 DIAGNOSIS — Z7901 Long term (current) use of anticoagulants: Secondary | ICD-10-CM

## 2010-07-09 DIAGNOSIS — D6859 Other primary thrombophilia: Secondary | ICD-10-CM

## 2010-07-09 LAB — PROTIME-INR: Protime: 28.8 Seconds — ABNORMAL HIGH (ref 10.6–13.4)

## 2010-07-11 ENCOUNTER — Ambulatory Visit: Payer: MEDICARE | Admitting: Internal Medicine

## 2010-07-12 ENCOUNTER — Ambulatory Visit (INDEPENDENT_AMBULATORY_CARE_PROVIDER_SITE_OTHER): Payer: MEDICARE | Admitting: Internal Medicine

## 2010-07-12 ENCOUNTER — Encounter: Payer: Self-pay | Admitting: Internal Medicine

## 2010-07-12 ENCOUNTER — Other Ambulatory Visit: Payer: Self-pay | Admitting: Internal Medicine

## 2010-07-12 DIAGNOSIS — H9209 Otalgia, unspecified ear: Secondary | ICD-10-CM

## 2010-07-12 DIAGNOSIS — H9202 Otalgia, left ear: Secondary | ICD-10-CM

## 2010-07-13 ENCOUNTER — Encounter: Payer: Self-pay | Admitting: Internal Medicine

## 2010-07-13 DIAGNOSIS — H9202 Otalgia, left ear: Secondary | ICD-10-CM | POA: Insufficient documentation

## 2010-07-13 NOTE — Assessment & Plan Note (Signed)
Notes improvement of sx's s/p irrigation. Followup if sx's recur and would consider ent consult.

## 2010-07-13 NOTE — Progress Notes (Signed)
  Subjective:    Patient ID: Amanda Turner, female    DOB: 03-30-1946, 65 y.o.   MRN: [id number]  HPI Pt presents to clinic for evaluation of left ear discomfort. States had stroke and since then has had decreased hearing and discomfort of left ear. However most recently notes worsening discomfort and decreased hearing. Feels as if there is fluid in ear but no discharge/drainage. Denies f/c or recent illness. No alleviating or exacerbating factors. Considering ENT appt if necessary.  Reviewed pmh, medications and allergies    Review of Systems  Constitutional: Negative for fever and chills.  HENT: Positive for hearing loss and ear pain. Negative for congestion, facial swelling, rhinorrhea, tinnitus and ear discharge.   Eyes: Negative for discharge and redness.  Respiratory: Negative for cough and wheezing.        Objective:   Physical Exam  Nursing note and vitals reviewed. Constitutional: She appears well-developed and well-nourished. No distress.  HENT:  Head: Normocephalic and atraumatic.  Right Ear: Tympanic membrane, external ear and ear canal normal.  Left Ear: External ear normal.  Nose: Nose normal.  Mouth/Throat: Oropharynx is clear and moist.       Initial exam of left ear canal demonstrates cerumen impaction without other abn. S/p irrigation exam repeated with nl canal and nl TM. No abn noted  Eyes: Conjunctivae are normal. Right eye exhibits no discharge. Left eye exhibits no discharge. No scleral icterus.  Neck: Neck supple.  Skin: She is not diaphoretic.          Assessment & Plan:

## 2010-07-29 ENCOUNTER — Other Ambulatory Visit: Payer: Self-pay | Admitting: Internal Medicine

## 2010-08-05 LAB — BASIC METABOLIC PANEL
BUN: 18 mg/dL (ref 6–23)
CO2: 25 mEq/L (ref 19–32)
Calcium: 8.6 mg/dL (ref 8.4–10.5)
Creatinine, Ser: 0.56 mg/dL (ref 0.4–1.2)
GFR calc Af Amer: >60 mL/min (ref >60)
Glucose, Bld: 89 mg/dL (ref 70–99)

## 2010-08-06 ENCOUNTER — Encounter (HOSPITAL_BASED_OUTPATIENT_CLINIC_OR_DEPARTMENT_OTHER): Payer: MEDICARE | Admitting: Oncology

## 2010-08-06 ENCOUNTER — Other Ambulatory Visit: Payer: Self-pay | Admitting: Oncology

## 2010-08-06 DIAGNOSIS — Z7901 Long term (current) use of anticoagulants: Secondary | ICD-10-CM

## 2010-08-06 DIAGNOSIS — D6859 Other primary thrombophilia: Secondary | ICD-10-CM

## 2010-08-06 LAB — PROTEIN C ACTIVITY: Protein C Activity: 200 % — ABNORMAL HIGH (ref 75–133)

## 2010-08-06 LAB — CBC
HCT: 36.7 % (ref 36.0–46.0)
Hemoglobin: 11.8 g/dL — ABNORMAL LOW (ref 12.0–15.0)
Hemoglobin: 12.3 g/dL (ref 12.0–15.0)
Hemoglobin: 12.7 g/dL (ref 12.0–15.0)
MCHC: 34.3 g/dL (ref 30.0–36.0)
MCHC: 34.6 g/dL (ref 30.0–36.0)
MCHC: 34.7 g/dL (ref 30.0–36.0)
MCV: 93.1 fL (ref 78.0–100.0)
MCV: 93.7 fL (ref 78.0–100.0)
Platelets: 189 10*3/uL (ref 150–400)
RBC: 3.44 MIL/uL — ABNORMAL LOW (ref 3.87–5.11)
RBC: 3.62 MIL/uL — ABNORMAL LOW (ref 3.87–5.11)
RBC: 3.73 MIL/uL — ABNORMAL LOW (ref 3.87–5.11)
RBC: 3.92 MIL/uL (ref 3.87–5.11)
RDW: 12.6 % (ref 11.5–15.5)
WBC: 5.4 10*3/uL (ref 4.0–10.5)
WBC: 5.4 10*3/uL (ref 4.0–10.5)
WBC: 8.7 10*3/uL (ref 4.0–10.5)

## 2010-08-06 LAB — BASIC METABOLIC PANEL
CO2: 27 mEq/L (ref 19–32)
Calcium: 7.7 mg/dL — ABNORMAL LOW (ref 8.4–10.5)
Calcium: 8.3 mg/dL — ABNORMAL LOW (ref 8.4–10.5)
Calcium: 8.5 mg/dL (ref 8.4–10.5)
Calcium: 8.6 mg/dL (ref 8.4–10.5)
Chloride: 105 mEq/L (ref 96–112)
Chloride: 107 mEq/L (ref 96–112)
GFR calc Af Amer: >60 mL/min (ref >60)
GFR calc Af Amer: >60 mL/min (ref >60)
GFR calc Af Amer: >60 mL/min (ref >60)
GFR calc Af Amer: >60 mL/min (ref >60)
GFR calc Af Amer: >60 mL/min (ref >60)
GFR calc non Af Amer: >60 mL/min (ref >60)
GFR calc non Af Amer: >60 mL/min (ref >60)
GFR calc non Af Amer: >60 mL/min (ref >60)
GFR calc non Af Amer: >60 mL/min (ref >60)
Potassium: 2.8 mEq/L — ABNORMAL LOW (ref 3.5–5.1)
Potassium: 3.2 mEq/L — ABNORMAL LOW (ref 3.5–5.1)
Potassium: 3.8 mEq/L (ref 3.5–5.1)
Sodium: 138 mEq/L (ref 135–145)
Sodium: 138 mEq/L (ref 135–145)
Sodium: 139 mEq/L (ref 135–145)
Sodium: 140 mEq/L (ref 135–145)

## 2010-08-06 LAB — PROTEIN C, TOTAL: Protein C, Total: 134 % (ref 70–140)

## 2010-08-06 LAB — COMPREHENSIVE METABOLIC PANEL
ALT: 26 U/L (ref 0–35)
AST: 21 U/L (ref 0–37)
CO2: 24 mEq/L (ref 19–32)
Chloride: 111 mEq/L (ref 96–112)
Creatinine, Ser: 0.56 mg/dL (ref 0.4–1.2)
GFR calc Af Amer: >60 mL/min (ref >60)
GFR calc non Af Amer: >60 mL/min (ref >60)
Glucose, Bld: 89 mg/dL (ref 70–99)
Total Bilirubin: 0.6 mg/dL (ref 0.3–1.2)

## 2010-08-06 LAB — FACTOR 5 LEIDEN

## 2010-08-06 LAB — VANCOMYCIN, TROUGH: Vancomycin Tr: 12.2 ug/mL (ref 10.0–20.0)

## 2010-08-06 LAB — BETA-2-GLYCOPROTEIN I ABS, IGG/M/A: Beta-2 Glyco I IgG: 6 U/mL (ref <=15)

## 2010-08-06 LAB — DIFFERENTIAL
Basophils Absolute: 0.1 10*3/uL (ref 0.0–0.1)
Eosinophils Absolute: 0.3 10*3/uL (ref 0.0–0.7)
Eosinophils Absolute: 0.5 10*3/uL (ref 0.0–0.7)
Eosinophils Relative: 10 % — ABNORMAL HIGH (ref 0–5)
Eosinophils Relative: 6 % — ABNORMAL HIGH (ref 0–5)
Lymphocytes Relative: 28 % (ref 12–46)
Lymphs Abs: 1.4 10*3/uL (ref 0.7–4.0)
Monocytes Absolute: 0.7 10*3/uL (ref 0.1–1.0)
Monocytes Relative: 12 % (ref 3–12)
Neutrophils Relative %: 55 % (ref 43–77)

## 2010-08-06 LAB — PROTIME-INR
Prothrombin Time: 14.2 seconds (ref 11.6–15.2)
Protime: 28.8 Seconds — ABNORMAL HIGH (ref 10.6–13.4)

## 2010-08-06 LAB — CARDIOLIPIN ANTIBODIES, IGG, IGM, IGA
Anticardiolipin IgA: <3 APL U/mL — ABNORMAL LOW (ref <10)
Anticardiolipin IgG: 4 GPL U/mL — ABNORMAL LOW (ref <10)
Anticardiolipin IgM: 4 MPL U/mL — ABNORMAL LOW (ref <10)

## 2010-08-06 LAB — LIPID PANEL
Cholesterol: 195 mg/dL (ref 0–200)
HDL: 40 mg/dL (ref >39)

## 2010-08-06 LAB — CARDIAC PANEL(CRET KIN+CKTOT+MB+TROPI)
CK, MB: 0.4 ng/mL (ref 0.3–4.0)
Relative Index: INVALID (ref 0.0–2.5)
Total CK: 27 U/L (ref 7–177)

## 2010-08-06 LAB — PROTEIN S ACTIVITY: Protein S Activity: 97 % (ref 69–129)

## 2010-08-06 LAB — GLUCOSE, CAPILLARY: Glucose-Capillary: 128 mg/dL — ABNORMAL HIGH (ref 70–99)

## 2010-08-06 LAB — LUPUS ANTICOAGULANT PANEL
Drvvt confirmation: 1.44 Ratio — ABNORMAL HIGH (ref <1.18)
PTT Lupus Anticoagulant: 35 secs (ref 32.0–43.4)

## 2010-08-06 LAB — HOMOCYSTEINE: Homocysteine: 4.9 umol/L (ref 4.0–15.4)

## 2010-08-06 LAB — HEMOGLOBIN A1C
Hgb A1c MFr Bld: 5.8 % (ref 4.6–6.1)
Mean Plasma Glucose: 120 mg/dL

## 2010-08-06 LAB — PROTEIN S, TOTAL: Protein S Ag, Total: 121 % (ref 70–140)

## 2010-08-06 LAB — PROTHROMBIN GENE MUTATION

## 2010-08-07 LAB — CBC
HCT: 41.6 % (ref 36.0–46.0)
Hemoglobin: 14.5 g/dL (ref 12.0–15.0)
Platelets: 225 10*3/uL (ref 150–400)
RBC: 4.47 MIL/uL (ref 3.87–5.11)
WBC: 6.4 10*3/uL (ref 4.0–10.5)

## 2010-08-07 LAB — BASIC METABOLIC PANEL
BUN: 11 mg/dL (ref 6–23)
BUN: 14 mg/dL (ref 6–23)
BUN: 14 mg/dL (ref 6–23)
BUN: 17 mg/dL (ref 6–23)
BUN: 17 mg/dL (ref 6–23)
CO2: 24 mEq/L (ref 19–32)
CO2: 26 mEq/L (ref 19–32)
CO2: 27 mEq/L (ref 19–32)
CO2: 30 mEq/L (ref 19–32)
Calcium: 8.3 mg/dL — ABNORMAL LOW (ref 8.4–10.5)
Calcium: 8.4 mg/dL (ref 8.4–10.5)
Calcium: 8.5 mg/dL (ref 8.4–10.5)
Calcium: 8.5 mg/dL (ref 8.4–10.5)
Calcium: 8.5 mg/dL (ref 8.4–10.5)
Chloride: 107 mEq/L (ref 96–112)
Chloride: 107 mEq/L (ref 96–112)
Chloride: 108 mEq/L (ref 96–112)
Chloride: 103 mEq/L (ref 96–112)
Creatinine, Ser: 0.53 mg/dL (ref 0.4–1.2)
Creatinine, Ser: 0.58 mg/dL (ref 0.4–1.2)
Creatinine, Ser: 0.67 mg/dL (ref 0.4–1.2)
GFR calc Af Amer: >60 mL/min (ref >60)
GFR calc Af Amer: >60 mL/min (ref >60)
GFR calc Af Amer: >60 mL/min (ref >60)
GFR calc non Af Amer: >60 mL/min (ref >60)
GFR calc non Af Amer: >60 mL/min (ref >60)
GFR calc non Af Amer: >60 mL/min (ref >60)
GFR calc non Af Amer: >60 mL/min (ref >60)
Glucose, Bld: 130 mg/dL — ABNORMAL HIGH (ref 70–99)
Glucose, Bld: 133 mg/dL — ABNORMAL HIGH (ref 70–99)
Glucose, Bld: 133 mg/dL — ABNORMAL HIGH (ref 70–99)
Glucose, Bld: 137 mg/dL — ABNORMAL HIGH (ref 70–99)
Glucose, Bld: 172 mg/dL — ABNORMAL HIGH (ref 70–99)
Glucose, Bld: 93 mg/dL (ref 70–99)
Potassium: 3.4 mEq/L — ABNORMAL LOW (ref 3.5–5.1)
Potassium: 3.5 mEq/L (ref 3.5–5.1)
Potassium: 3.8 mEq/L (ref 3.5–5.1)
Potassium: 3.8 mEq/L (ref 3.5–5.1)
Sodium: 137 mEq/L (ref 135–145)
Sodium: 137 mEq/L (ref 135–145)
Sodium: 138 mEq/L (ref 135–145)
Sodium: 139 mEq/L (ref 135–145)
Sodium: 139 mEq/L (ref 135–145)

## 2010-08-07 LAB — CSF CELL COUNT WITH DIFFERENTIAL
Eosinophils, CSF: 0 % (ref 0–1)
Lymphs, CSF: 0 % — ABNORMAL LOW (ref 40–80)
Monocyte-Macrophage-Spinal Fluid: 2 % — ABNORMAL LOW (ref 15–45)
Other Cells, CSF: 0
Segmented Neutrophils-CSF: 98 % — ABNORMAL HIGH (ref 0–6)
WBC, CSF: 1 /mm3 (ref 0–5)

## 2010-08-07 LAB — GLUCOSE, CAPILLARY: Glucose-Capillary: 116 mg/dL — ABNORMAL HIGH (ref 70–99)

## 2010-08-07 LAB — MRSA PCR SCREENING: MRSA by PCR: NEGATIVE

## 2010-08-07 LAB — CSF CULTURE W GRAM STAIN

## 2010-08-07 LAB — PROTEIN AND GLUCOSE, CSF: Glucose, CSF: 55 mg/dL (ref 43–76)

## 2010-08-10 LAB — CREATININE, SERUM: GFR calc Af Amer: >60 mL/min (ref >60)

## 2010-08-28 ENCOUNTER — Ambulatory Visit (INDEPENDENT_AMBULATORY_CARE_PROVIDER_SITE_OTHER): Payer: MEDICARE | Admitting: Family Medicine

## 2010-08-28 ENCOUNTER — Encounter: Payer: Self-pay | Admitting: Family Medicine

## 2010-08-28 VITALS — BP 120/78 | Temp 98.0°F | Wt 204.0 lb

## 2010-08-28 DIAGNOSIS — R21 Rash and other nonspecific skin eruption: Secondary | ICD-10-CM

## 2010-08-28 DIAGNOSIS — L739 Follicular disorder, unspecified: Secondary | ICD-10-CM

## 2010-08-28 MED ORDER — DOXYCYCLINE HYCLATE 100 MG PO CAPS: 100.0000 mg | ORAL_CAPSULE | Freq: Two times a day (BID) | ORAL | Status: AC

## 2010-08-28 NOTE — Progress Notes (Signed)
  Subjective:    Patient ID: Amanda Turner, female    DOB: 1945-08-03, 65 y.o.   MRN: [id number]  HPI Patient seen with somewhat pruritic and somewhat burning rash across her chin. Similar rash in the past. She has some small erythematous papules and pustules. She has tried Neosporin topically without relief. She has not taken any estrogen products. No fever or chills. No alleviating factors.  Medical history is that she's had previous surgery for craniopharyngioma. Also has history of hyperlipidemia, hypertension, CVA, and takes Coumadin for hypercoagulable state   Review of Systems  Constitutional: Negative for fever and chills.  Respiratory: Negative for cough and shortness of breath.   Cardiovascular: Negative for chest pain.       Objective:   Physical Exam  Constitutional: She appears well-developed and well-nourished.  Cardiovascular: Normal rate, regular rhythm and normal heart sounds.   Pulmonary/Chest: Effort normal and breath sounds normal. No respiratory distress. She has no wheezes. She has no rales.  Lymphadenopathy:    She has no cervical adenopathy.  Skin:       Patient skin rash confined to the chin region and just above the upper lip. She has several small erythematous papules and a few small pustules. Nontender palpation no scaling no vesicles          Assessment & Plan:  Skin rash. This looks more follicular. Similar type rash with perioral dermatitis but this is usually seen in pre-menopause. Start doxycycline 100 mg twice a day for 10 days and she should have her Coumadin reassessed in one week

## 2010-08-28 NOTE — Patient Instructions (Signed)
Leave off topical neosporin. Touch base with your primary physician in 2 weeks if no better. Be sure to get your coumadin monitored by next week.

## 2010-09-03 ENCOUNTER — Other Ambulatory Visit: Payer: Self-pay | Admitting: Oncology

## 2010-09-03 ENCOUNTER — Encounter (HOSPITAL_BASED_OUTPATIENT_CLINIC_OR_DEPARTMENT_OTHER): Payer: MEDICARE | Admitting: Oncology

## 2010-09-03 DIAGNOSIS — D6859 Other primary thrombophilia: Secondary | ICD-10-CM

## 2010-09-03 DIAGNOSIS — Z5181 Encounter for therapeutic drug level monitoring: Secondary | ICD-10-CM

## 2010-09-03 DIAGNOSIS — Z7901 Long term (current) use of anticoagulants: Secondary | ICD-10-CM

## 2010-09-03 DIAGNOSIS — R21 Rash and other nonspecific skin eruption: Secondary | ICD-10-CM

## 2010-09-03 LAB — CBC WITH DIFFERENTIAL/PLATELET
EOS%: 1.9 % (ref 0.0–7.0)
Eosinophils Absolute: 0.1 10*3/uL (ref 0.0–0.5)
MCV: 91.9 fL (ref 79.5–101.0)
MONO%: 8.4 % (ref 0.0–14.0)
NEUT#: 3.3 10*3/uL (ref 1.5–6.5)
RBC: 4.86 10*6/uL (ref 3.70–5.45)
RDW: 12.5 % (ref 11.2–14.5)

## 2010-09-03 LAB — PROTIME-INR
INR: 2.6 (ref 2.00–3.50)
Protime: 31.2 Seconds — ABNORMAL HIGH (ref 10.6–13.4)

## 2010-09-10 ENCOUNTER — Other Ambulatory Visit: Payer: Self-pay | Admitting: Oncology

## 2010-09-10 ENCOUNTER — Encounter (HOSPITAL_BASED_OUTPATIENT_CLINIC_OR_DEPARTMENT_OTHER): Payer: MEDICARE | Admitting: Oncology

## 2010-09-10 DIAGNOSIS — Z7901 Long term (current) use of anticoagulants: Secondary | ICD-10-CM

## 2010-09-10 DIAGNOSIS — D6859 Other primary thrombophilia: Secondary | ICD-10-CM

## 2010-09-10 DIAGNOSIS — M329 Systemic lupus erythematosus, unspecified: Secondary | ICD-10-CM

## 2010-09-10 DIAGNOSIS — D689 Coagulation defect, unspecified: Secondary | ICD-10-CM

## 2010-09-10 LAB — CBC WITH DIFFERENTIAL/PLATELET
BASO%: 0.6 % (ref 0.0–2.0)
Basophils Absolute: 0 10e3/uL (ref 0.0–0.1)
EOS%: 2.1 % (ref 0.0–7.0)
Eosinophils Absolute: 0.1 10e3/uL (ref 0.0–0.5)
HCT: 42.9 % (ref 34.8–46.6)
HGB: 14.5 g/dL (ref 11.6–15.9)
LYMPH%: 33.4 % (ref 14.0–49.7)
MCH: 31.1 pg (ref 25.1–34.0)
MCHC: 33.9 g/dL (ref 31.5–36.0)
MCV: 91.8 fL (ref 79.5–101.0)
MONO#: 0.6 10e3/uL (ref 0.1–0.9)
MONO%: 10.7 % (ref 0.0–14.0)
NEUT#: 2.8 10e3/uL (ref 1.5–6.5)
NEUT%: 53.2 % (ref 38.4–76.8)
Platelets: 169 10e3/uL (ref 145–400)
RBC: 4.67 10e6/uL (ref 3.70–5.45)
RDW: 12.6 % (ref 11.2–14.5)
WBC: 5.3 10e3/uL (ref 3.9–10.3)
lymph#: 1.8 10e3/uL (ref 0.9–3.3)

## 2010-09-10 LAB — PROTIME-INR
INR: 2.8 (ref 2.00–3.50)
Protime: 33.6 Seconds — ABNORMAL HIGH (ref 10.6–13.4)

## 2010-09-11 LAB — COMPREHENSIVE METABOLIC PANEL
AST: 19 U/L (ref 0–37)
Albumin: 3.9 g/dL (ref 3.5–5.2)
Alkaline Phosphatase: 83 U/L (ref 39–117)
Potassium: 3.5 mEq/L (ref 3.5–5.3)
Sodium: 141 mEq/L (ref 135–145)
Total Protein: 6.7 g/dL (ref 6.0–8.3)

## 2010-09-25 ENCOUNTER — Other Ambulatory Visit: Payer: Self-pay | Admitting: Internal Medicine

## 2010-10-04 ENCOUNTER — Other Ambulatory Visit: Payer: Self-pay | Admitting: *Deleted

## 2010-10-04 DIAGNOSIS — F419 Anxiety disorder, unspecified: Secondary | ICD-10-CM

## 2010-10-04 MED ORDER — ALPRAZOLAM 0.5 MG PO TABS: 0.5000 mg | ORAL_TABLET | Freq: Every day | ORAL | Status: DC

## 2010-10-08 ENCOUNTER — Other Ambulatory Visit: Payer: Self-pay | Admitting: Oncology

## 2010-10-08 ENCOUNTER — Encounter (HOSPITAL_BASED_OUTPATIENT_CLINIC_OR_DEPARTMENT_OTHER): Payer: Medicare Other | Admitting: Oncology

## 2010-10-08 DIAGNOSIS — D6859 Other primary thrombophilia: Secondary | ICD-10-CM

## 2010-10-08 DIAGNOSIS — Z7901 Long term (current) use of anticoagulants: Secondary | ICD-10-CM

## 2010-10-08 LAB — PROTIME-INR: Protime: 26.4 Seconds — ABNORMAL HIGH (ref 10.6–13.4)

## 2010-11-12 ENCOUNTER — Other Ambulatory Visit: Payer: Self-pay | Admitting: Oncology

## 2010-11-12 ENCOUNTER — Encounter (HOSPITAL_BASED_OUTPATIENT_CLINIC_OR_DEPARTMENT_OTHER): Payer: Medicare Other | Admitting: Oncology

## 2010-11-12 DIAGNOSIS — R21 Rash and other nonspecific skin eruption: Secondary | ICD-10-CM

## 2010-11-12 DIAGNOSIS — D6859 Other primary thrombophilia: Secondary | ICD-10-CM

## 2010-11-12 DIAGNOSIS — Z7901 Long term (current) use of anticoagulants: Secondary | ICD-10-CM

## 2010-11-12 LAB — PROTIME-INR
INR: 2.7 (ref 2.00–3.50)
Protime: 32.4 Seconds — ABNORMAL HIGH (ref 10.6–13.4)

## 2010-11-14 ENCOUNTER — Other Ambulatory Visit: Payer: Self-pay | Admitting: Internal Medicine

## 2010-11-15 ENCOUNTER — Ambulatory Visit (INDEPENDENT_AMBULATORY_CARE_PROVIDER_SITE_OTHER): Payer: Medicare Other | Admitting: Family Medicine

## 2010-11-15 ENCOUNTER — Encounter: Payer: Self-pay | Admitting: Family Medicine

## 2010-11-15 VITALS — BP 130/78 | Temp 98.5°F | Wt 213.0 lb

## 2010-11-15 DIAGNOSIS — R21 Rash and other nonspecific skin eruption: Secondary | ICD-10-CM

## 2010-11-15 MED ORDER — DOXYCYCLINE HYCLATE 100 MG PO CAPS: 100.0000 mg | ORAL_CAPSULE | Freq: Two times a day (BID) | ORAL | Status: DC

## 2010-11-15 MED ORDER — METRONIDAZOLE 0.75 % EX GEL: Freq: Two times a day (BID) | CUTANEOUS | Status: DC

## 2010-11-15 NOTE — Progress Notes (Signed)
  Subjective:    Patient ID: Amanda Turner, female    DOB: 1945/12/21, 65 y.o.   MRN: [id number]  HPI Recurrent facial rash. Papules and occasional pustules. Mostly central face. Recently treated with doxycycline and rash went completely away after 8-10 days. Rash tends to occur with stress. No fever or chills. Takes chronic Coumadin. No recent bleeding complications. No alleviating features other than antibiotic   Review of Systems  Constitutional: Negative for fever and chills.  Respiratory: Negative for shortness of breath.   Cardiovascular: Negative for chest pain.  Skin: Positive for rash.       Objective:   Physical Exam  Constitutional: She appears well-developed and well-nourished.  Cardiovascular: Normal rate.   Pulmonary/Chest: Effort normal and breath sounds normal. No respiratory distress. She has no wheezes. She has no rales.  Skin:       Patient has rash with erythematous papules occasional pustules mostly central face upper lip and chin region. Couple on nose          Assessment & Plan:  Recurrent facial rash, question rosacea. Refill doxycycline for 10 days. Suggest Coumadin check in 7 days.  Metro gel topical twice a day

## 2010-12-02 ENCOUNTER — Other Ambulatory Visit: Payer: Self-pay | Admitting: *Deleted

## 2010-12-02 DIAGNOSIS — F419 Anxiety disorder, unspecified: Secondary | ICD-10-CM

## 2010-12-02 MED ORDER — ALPRAZOLAM 0.5 MG PO TABS: 0.5000 mg | ORAL_TABLET | Freq: Every day | ORAL | Status: DC

## 2010-12-10 ENCOUNTER — Encounter (HOSPITAL_BASED_OUTPATIENT_CLINIC_OR_DEPARTMENT_OTHER): Payer: Medicare Other | Admitting: Oncology

## 2010-12-10 ENCOUNTER — Other Ambulatory Visit: Payer: Self-pay | Admitting: Oncology

## 2010-12-10 DIAGNOSIS — D6859 Other primary thrombophilia: Secondary | ICD-10-CM

## 2010-12-10 DIAGNOSIS — Z7901 Long term (current) use of anticoagulants: Secondary | ICD-10-CM

## 2010-12-10 LAB — PROTIME-INR
INR: 2.2 (ref 2.00–3.50)
Protime: 26.4 Seconds — ABNORMAL HIGH (ref 10.6–13.4)

## 2010-12-12 ENCOUNTER — Other Ambulatory Visit: Payer: Self-pay | Admitting: Internal Medicine

## 2010-12-27 ENCOUNTER — Other Ambulatory Visit: Payer: Self-pay

## 2010-12-27 DIAGNOSIS — F419 Anxiety disorder, unspecified: Secondary | ICD-10-CM

## 2010-12-27 MED ORDER — ALPRAZOLAM 0.5 MG PO TABS: 0.5000 mg | ORAL_TABLET | Freq: Every day | ORAL | Status: DC

## 2010-12-27 NOTE — Telephone Encounter (Signed)
[facility] 

## 2011-01-10 ENCOUNTER — Encounter (HOSPITAL_BASED_OUTPATIENT_CLINIC_OR_DEPARTMENT_OTHER): Payer: Medicare Other | Admitting: Oncology

## 2011-01-10 ENCOUNTER — Other Ambulatory Visit: Payer: Self-pay | Admitting: Oncology

## 2011-01-10 DIAGNOSIS — Z7901 Long term (current) use of anticoagulants: Secondary | ICD-10-CM

## 2011-01-10 DIAGNOSIS — D6859 Other primary thrombophilia: Secondary | ICD-10-CM

## 2011-01-10 LAB — PROTIME-INR

## 2011-02-05 ENCOUNTER — Encounter (HOSPITAL_BASED_OUTPATIENT_CLINIC_OR_DEPARTMENT_OTHER): Payer: Medicare Other | Admitting: Oncology

## 2011-02-05 ENCOUNTER — Other Ambulatory Visit: Payer: Self-pay | Admitting: Oncology

## 2011-02-05 DIAGNOSIS — D6859 Other primary thrombophilia: Secondary | ICD-10-CM

## 2011-02-05 DIAGNOSIS — Z7901 Long term (current) use of anticoagulants: Secondary | ICD-10-CM

## 2011-02-05 LAB — PROTIME-INR
INR: 3.3 (ref 2.00–3.50)
Protime: 39.6 Seconds — ABNORMAL HIGH (ref 10.6–13.4)

## 2011-02-12 ENCOUNTER — Other Ambulatory Visit: Payer: Self-pay | Admitting: Obstetrics

## 2011-02-12 DIAGNOSIS — Z1231 Encounter for screening mammogram for malignant neoplasm of breast: Secondary | ICD-10-CM

## 2011-02-19 ENCOUNTER — Encounter (HOSPITAL_BASED_OUTPATIENT_CLINIC_OR_DEPARTMENT_OTHER): Payer: Medicare Other | Admitting: Oncology

## 2011-02-19 ENCOUNTER — Other Ambulatory Visit: Payer: Self-pay | Admitting: Oncology

## 2011-02-19 DIAGNOSIS — D6859 Other primary thrombophilia: Secondary | ICD-10-CM

## 2011-02-19 DIAGNOSIS — Z7901 Long term (current) use of anticoagulants: Secondary | ICD-10-CM

## 2011-02-20 ENCOUNTER — Ambulatory Visit: Payer: Medicare Other

## 2011-02-27 ENCOUNTER — Ambulatory Visit
Admission: RE | Admit: 2011-02-27 | Discharge: 2011-02-27 | Disposition: A | Discharge status: Home / Self Care | Payer: Medicare Other | Source: Ambulatory Visit | Attending: Obstetrics | Admitting: Obstetrics

## 2011-02-27 ENCOUNTER — Other Ambulatory Visit: Payer: Self-pay

## 2011-02-27 ENCOUNTER — Other Ambulatory Visit: Payer: Self-pay | Admitting: Obstetrics

## 2011-02-27 DIAGNOSIS — E2839 Other primary ovarian failure: Secondary | ICD-10-CM

## 2011-02-27 DIAGNOSIS — Z1231 Encounter for screening mammogram for malignant neoplasm of breast: Secondary | ICD-10-CM

## 2011-02-27 DIAGNOSIS — F419 Anxiety disorder, unspecified: Secondary | ICD-10-CM

## 2011-02-27 NOTE — Telephone Encounter (Signed)
Pt request refill of xanax pt states she takes the medication qam.  Pls advise.

## 2011-02-28 ENCOUNTER — Other Ambulatory Visit: Payer: Self-pay | Admitting: Internal Medicine

## 2011-02-28 MED ORDER — ALPRAZOLAM 0.5 MG PO TABS: 0.5000 mg | ORAL_TABLET | Freq: Every day | ORAL | Status: DC

## 2011-02-28 NOTE — Telephone Encounter (Signed)
rx called in, pt aware 

## 2011-02-28 NOTE — Telephone Encounter (Signed)
Ok 30/ 1 refill

## 2011-03-06 DIAGNOSIS — I639 Cerebral infarction, unspecified: Secondary | ICD-10-CM | POA: Insufficient documentation

## 2011-03-06 DIAGNOSIS — Z8673 Personal history of transient ischemic attack (TIA), and cerebral infarction without residual deficits: Secondary | ICD-10-CM | POA: Insufficient documentation

## 2011-03-09 ENCOUNTER — Other Ambulatory Visit: Payer: Self-pay | Admitting: Internal Medicine

## 2011-03-11 ENCOUNTER — Other Ambulatory Visit: Payer: Medicare Other | Admitting: Lab

## 2011-03-12 ENCOUNTER — Telehealth: Payer: Self-pay | Admitting: Internal Medicine

## 2011-03-12 ENCOUNTER — Other Ambulatory Visit: Payer: Self-pay | Admitting: Oncology

## 2011-03-12 ENCOUNTER — Other Ambulatory Visit (HOSPITAL_BASED_OUTPATIENT_CLINIC_OR_DEPARTMENT_OTHER): Payer: Medicare Other | Admitting: Lab

## 2011-03-12 ENCOUNTER — Ambulatory Visit: Payer: Medicare Other

## 2011-03-12 DIAGNOSIS — D6859 Other primary thrombophilia: Secondary | ICD-10-CM

## 2011-03-12 DIAGNOSIS — I639 Cerebral infarction, unspecified: Secondary | ICD-10-CM

## 2011-03-12 DIAGNOSIS — Z7901 Long term (current) use of anticoagulants: Secondary | ICD-10-CM

## 2011-03-12 DIAGNOSIS — R760 Raised antibody titer: Secondary | ICD-10-CM

## 2011-03-12 LAB — PROTIME-INR
INR: 2.8 (ref 2.00–3.50)
Protime: 33.6 Seconds — ABNORMAL HIGH (ref 10.6–13.4)

## 2011-03-12 MED ORDER — HYDROCHLOROTHIAZIDE 25 MG PO TABS: 25.0000 mg | ORAL_TABLET | Freq: Every day | ORAL | Status: DC

## 2011-03-12 NOTE — Telephone Encounter (Signed)
rx sent in electronically 

## 2011-03-12 NOTE — Progress Notes (Signed)
Pt doing well today.  No issues to report. Cont. Same dose & return in 1 month. Marily Lente, Pharm.D.

## 2011-03-12 NOTE — Telephone Encounter (Signed)
Pt has sch a med check ov for 04/22/11 at 8:30 and is going to need a refill ofhydrochlorothiazide 25 MG tablet to last until pt come in for ov. [facility]s [facility]

## 2011-03-17 ENCOUNTER — Telehealth: Payer: Self-pay | Admitting: Oncology

## 2011-03-17 NOTE — Telephone Encounter (Signed)
Called pt ,left message to call us to r/s appt from 11/13.

## 2011-03-26 ENCOUNTER — Other Ambulatory Visit: Payer: Self-pay | Admitting: Oncology

## 2011-03-26 DIAGNOSIS — D6862 Lupus anticoagulant syndrome: Secondary | ICD-10-CM

## 2011-03-31 ENCOUNTER — Other Ambulatory Visit: Payer: Self-pay | Admitting: Internal Medicine

## 2011-04-05 ENCOUNTER — Telehealth: Payer: Self-pay | Admitting: Oncology

## 2011-04-05 NOTE — Telephone Encounter (Signed)
Talked to pt's husband gave him appt date

## 2011-04-07 ENCOUNTER — Ambulatory Visit (INDEPENDENT_AMBULATORY_CARE_PROVIDER_SITE_OTHER): Payer: Medicare Other | Admitting: Internal Medicine

## 2011-04-07 ENCOUNTER — Encounter: Payer: Self-pay | Admitting: Internal Medicine

## 2011-04-07 VITALS — BP 118/70 | HR 64 | Ht 64.0 in | Wt 208.0 lb

## 2011-04-07 DIAGNOSIS — R1033 Periumbilical pain: Secondary | ICD-10-CM

## 2011-04-07 MED ORDER — PEG-KCL-NACL-NASULF-NA ASC-C 100 G PO SOLR: 1.0000 | Freq: Once | ORAL | Status: DC

## 2011-04-07 MED ORDER — ALIGN PO CAPS: 1.0000 | ORAL_CAPSULE | Freq: Every day | ORAL | Status: DC

## 2011-04-07 NOTE — Patient Instructions (Addendum)
You have been scheduled for a colonoscopy. Please follow written instructions given to you at your visit today.  Please pick up your prep kit at the pharmacy within the next 2-3 days. Please continue your Coumadin for the procedure. You have been scheduled for an abdominal ultrasound at [facility] (1st floor of hospital) on 04/15/11 at 2:00 pm. Please arrive 15 minutes prior to your appointment for registration. Make certain not to have anything to eat or drink 6 hours prior to your appointment. Should you need to reschedule your appointment, please contact radiology at [phone]. We have given you samples of Align. This puts good bacteria back into your intestines. You should take 1 capsule by mouth once daily. If this works well for you, it can be purchased over the counter. CC: Dr Cyndie Chime, Dr Cato Mulligan

## 2011-04-07 NOTE — Progress Notes (Signed)
Amanda Turner Feb 12, 1946 MRN [id number]   History of Present Illness:  This is a 65 year old female with periumbilical abdominal discomfort, flatulence and distention related to meals and also bothering her at night. 2 of her sisters had their gallbladder removed. She denies actual pain but feels uncomfortable almost every day due to bloating. She has been overweight. She is somewhat constipated. A colonoscopy in 1998 in [place] showed hemorrhoids. She has a history of hypercoagulable state and had a stroke in December 2010. She has been on Coumadin long-term. 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 She denies nausea, vomiting, dysphagia or heartburn. Her medications include Pepcid for gastroesophageal reflux.   Past Medical History  Diagnosis Date  . Depression   . Hyperlipemia   . Hypertension   . Lupus   . Elevated LFT's   . HA (headache)   . Lupus anticoagulant disorder   . Stroke   . 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 Anxiety    Past Surgical History  Procedure Date  . Abdominal hysterectomy   . Transphenoidal pituitary resection     reports that she has never smoked. She has never used smokeless tobacco. She reports that she does not drink alcohol or use illicit drugs. family history includes Diabetes in her brother and sister and Hypertension in her mother.  There is no history of Colon cancer. Allergies  Allergen Reactions  . Peanut Oil     REACTION: hives  . Shrimp Flavor     hives  . Sulfonamide Derivatives     REACTION: shortness of breath        Review of Systems: Negative for dysphagia, odynophagia, shortness of breath or chest pain  The remainder of the 10 point ROS is negative except as outlined in H&P   Physical Exam: General appearance  Well developed, in no distress. Eyes- non icteric. HEENT nontraumatic, normocephalic. Mouth no lesions, tongue papillated, no  cheilosis. Neck supple without adenopathy, thyroid not enlarged, no carotid bruits, no JVD. Lungs Clear to auscultation bilaterally. Cor normal S1, normal S2, regular rhythm, no murmur,  quiet precordium. Abdomen: Soft somewhat obese abdomen nontender. Normoactive bowel sounds. No distention. Liver at costal margin. No bruit. Rectal: Moderate amount of soft Hemoccult negative stool. Rectal sphincter is normal there is no visible prolapse. Extremities no pedal edema. Skin no lesions. Neurological alert and oriented x 3. Psychological normal mood and affect.  Assessment and Plan:  Problem #1 Abdominal distention, bloating and food intolerance, all nonspecific symptoms which may indicate biliary dysfunction. She has a family history of gallbladder disease in her sisters. This also could represent functional constipation or irritable bowel syndrome. We need to rule out organic disease such as severe diverticulosis. We will proceed with an upper abdominal ultrasound as well as colonoscopy. I have discussed the prep and sedation. I have also given her samples probiotics and depending on the ultrasound of the gallbladder and the colonoscopy, we may consider other treatment options as well as dietary modifications. She agreed to the plan.  Problem #2 Hypercoagulable state on chronic Coumadin. Patient cannot stop Coumadin at this point. We will do the colonoscopy while she is on Coumadin.   04/07/2011 Lina Sar

## 2011-04-10 ENCOUNTER — Encounter: Payer: Self-pay | Admitting: Internal Medicine

## 2011-04-10 ENCOUNTER — Ambulatory Visit (AMBULATORY_SURGERY_CENTER): Payer: Medicare Other | Admitting: Internal Medicine

## 2011-04-10 VITALS — BP 121/60 | HR 55 | Temp 98.6°F | Resp 16 | Ht 64.0 in | Wt 208.0 lb

## 2011-04-10 DIAGNOSIS — Z1211 Encounter for screening for malignant neoplasm of colon: Secondary | ICD-10-CM

## 2011-04-10 DIAGNOSIS — R1033 Periumbilical pain: Secondary | ICD-10-CM

## 2011-04-10 MED ORDER — SODIUM CHLORIDE 0.9 % IV SOLN: 500.0000 mL | INTRAVENOUS | Status: DC

## 2011-04-10 MED ORDER — DICYCLOMINE HCL 10 MG PO CAPS: 10.0000 mg | ORAL_CAPSULE | Freq: Two times a day (BID) | ORAL | Status: DC | PRN

## 2011-04-10 NOTE — Progress Notes (Signed)
Patient did not experience any of the following events: a burn prior to discharge; a fall within the facility; wrong site/side/patient/procedure/implant event; or a hospital transfer or hospital admission upon discharge from the facility. (G8907) Patient did not have preoperative order for IV antibiotic SSI prophylaxis. (G8918)  

## 2011-04-10 NOTE — Progress Notes (Signed)
Last dose of coumadin was last P.M. Per the pt.  Dr. Juanda Chance was made aware of this. Maw  Propofol was administered by Shon Hough, CRNA. maw   The pt tolerated the colonoscopy very well. maw

## 2011-04-10 NOTE — Op Note (Signed)
[facility] [address] [place], [place]  [address]  COLONOSCOPY PROCEDURE REPORT  PATIENT:  Amanda Turner, Amanda Turner  MR#:  [id number] BIRTHDATE:  16-Jul-1945, 64 yrs. old  GENDER:  female ENDOSCOPIST:  Hedwig Morton. Juanda Chance, MD REF. BY:  Birdie Sons, M.D. PROCEDURE DATE:  04/10/2011 PROCEDURE:  Colonoscopy 19147 ASA CLASS:  Class II INDICATIONS:  Abdominal pain, colorectal cancer screening, average risk last colon 2001,[place] MEDICATIONS:   MAC sedation, administered by CRNA, propofol (Diprivan)  DESCRIPTION OF PROCEDURE:   After the risks and benefits and of the procedure were explained, informed consent was obtained. Digital rectal exam was performed and revealed no rectal masses. The LB [id number]-[id number] endoscope was introduced through the anus and advanced to the cecum, which was identified by both the appendix and ileocecal valve.  The quality of the prep was good, using MoviPrep.  The instrument was then slowly withdrawn as the colon was fully examined. <<PROCEDUREIMAGES>>  FINDINGS:  No polyps or cancers were seen (see image1, image2, image3, and image4).   Retroflexed views in the rectum revealed no abnormalities.    The scope was then withdrawn from the patient and the procedure completed.  COMPLICATIONS:  None ENDOSCOPIC IMPRESSION: 1) No polyps or cancers 2) Normal colonoscopy RECOMMENDATIONS: 1) High fiber diet. continue Bentyl 10 mg po prn abd. pain  REPEAT EXAM:  In 10 year(s) for.  ______________________________ Hedwig Morton. Juanda Chance, MD  CC:  n. eSIGNED:   Hedwig Morton. Brodie at 04/10/2011 02:48 PM  Billey Gosling, [phone]

## 2011-04-10 NOTE — Patient Instructions (Signed)
Discharge instructions given with verbal understanding.  Normal exam.  Resume previous medications. 

## 2011-04-11 ENCOUNTER — Ambulatory Visit (HOSPITAL_BASED_OUTPATIENT_CLINIC_OR_DEPARTMENT_OTHER): Payer: Self-pay | Admitting: Pharmacist

## 2011-04-11 ENCOUNTER — Ambulatory Visit: Payer: Medicare Other

## 2011-04-11 ENCOUNTER — Telehealth: Payer: Self-pay

## 2011-04-11 ENCOUNTER — Other Ambulatory Visit (HOSPITAL_BASED_OUTPATIENT_CLINIC_OR_DEPARTMENT_OTHER): Payer: Medicare Other | Admitting: Lab

## 2011-04-11 DIAGNOSIS — D6862 Lupus anticoagulant syndrome: Secondary | ICD-10-CM

## 2011-04-11 DIAGNOSIS — R760 Raised antibody titer: Secondary | ICD-10-CM

## 2011-04-11 DIAGNOSIS — I639 Cerebral infarction, unspecified: Secondary | ICD-10-CM

## 2011-04-11 DIAGNOSIS — I6350 Cerebral infarction due to unspecified occlusion or stenosis of unspecified cerebral artery: Secondary | ICD-10-CM

## 2011-04-11 DIAGNOSIS — D6859 Other primary thrombophilia: Secondary | ICD-10-CM

## 2011-04-11 DIAGNOSIS — R894 Abnormal immunological findings in specimens from other organs, systems and tissues: Secondary | ICD-10-CM

## 2011-04-11 LAB — PROTIME-INR
INR: 2.7 (ref 2.00–3.50)
Protime: 32.4 Seconds — ABNORMAL HIGH (ref 10.6–13.4)

## 2011-04-11 NOTE — Telephone Encounter (Signed)
No ID on answering machine. 

## 2011-04-11 NOTE — Progress Notes (Signed)
Pt had colonoscopy yesterday.  Was negative. Dr. Juanda Chance wrote Rx for Bentyl.  Pt will use for cramping.  No drug interaction w/ Coumadin identified. Also will begin taking Kdur again (Dr. Horald Pollen) & Metamucil daily for constipation (Dr. Juanda Chance). Doing well re: anticoag. Return 1 month.  Pt seeing Dr. Cyndie Chime in Feb. 2013.  We will coordinate her Coumadin clinic visit w/ MD visit in Feb. 2013.

## 2011-04-14 IMAGING — CR DG SKULL 1-3V
1 series · 1 of 1 positions shown · non-contrast
Comparison: None

CLINICAL DATA: Status post resection of pituitary tumor.  Unknown
instrument count

SKULL - 1-3 VIEW

[view not recorded]
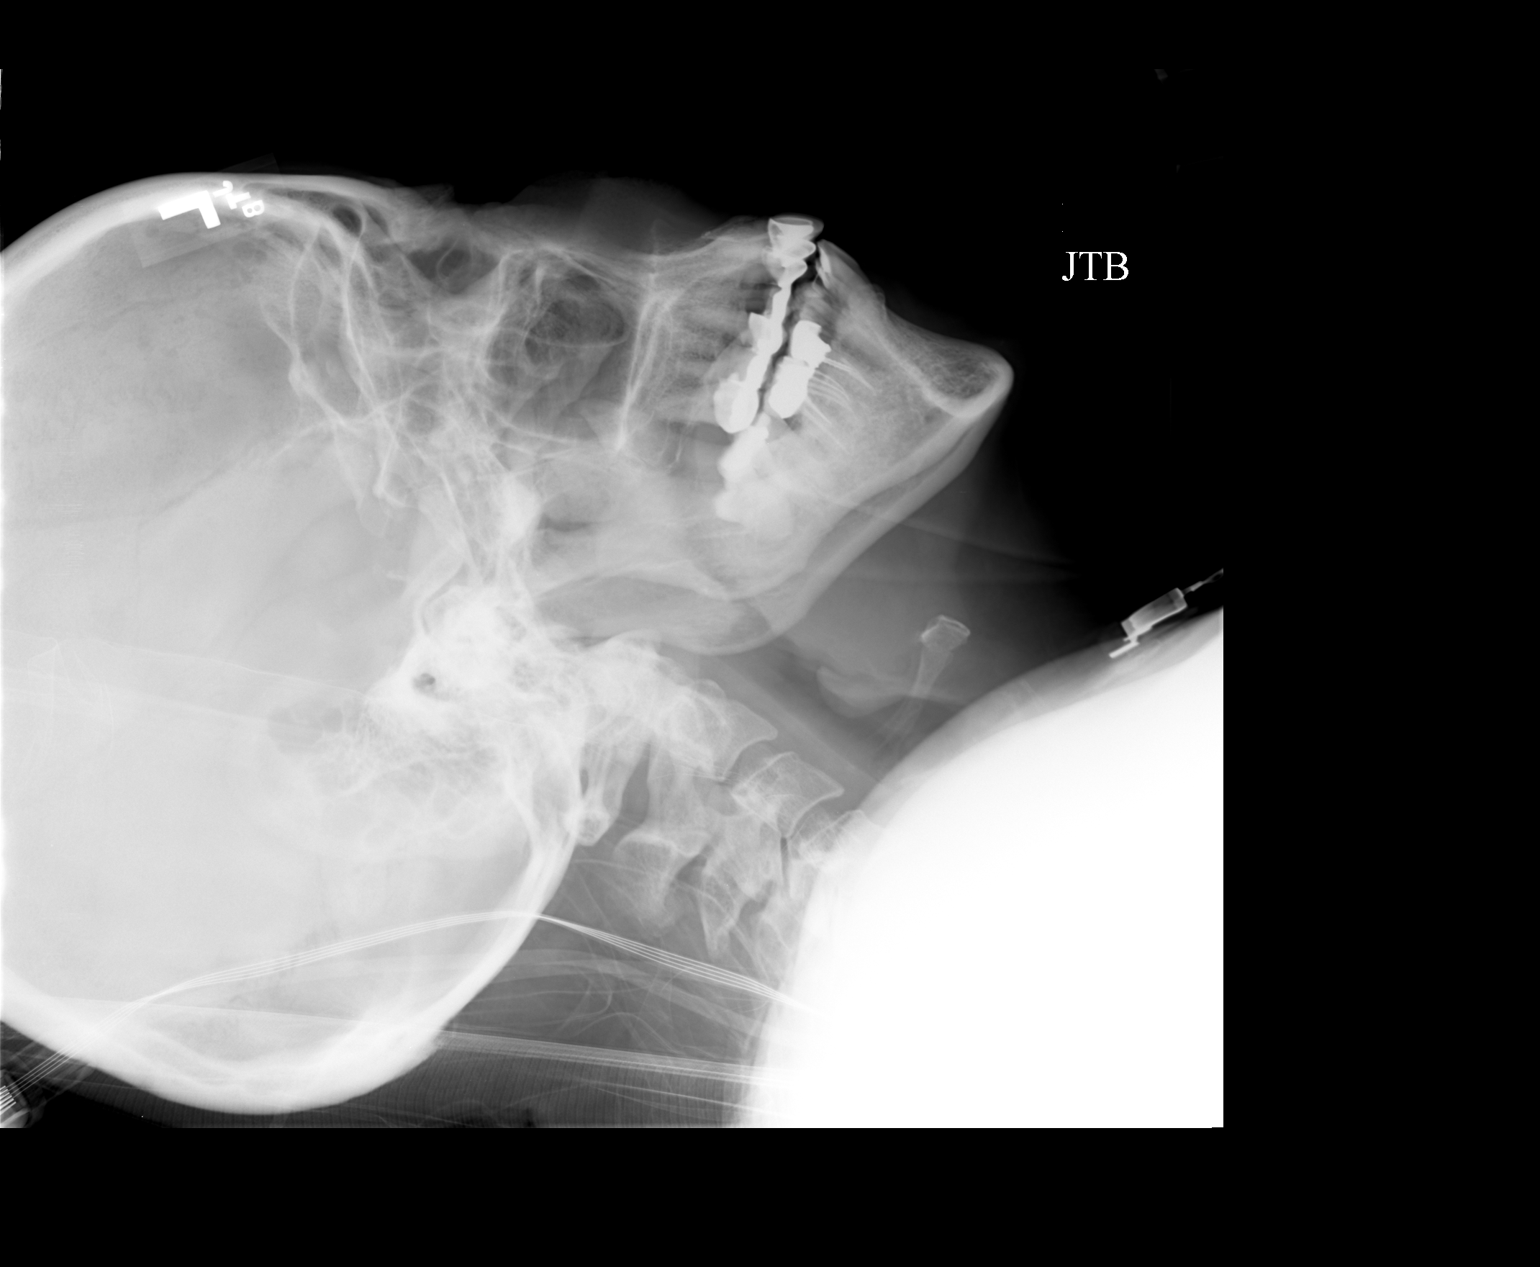

[1 of 1 positions shown; findings below may reference images not displayed]

FINDINGS: AP and lateral images were obtained in the operating room
using portable apparatus.  There is no obvious radiopaque foreign
body or other pathological findings into views.
IMPRESSION: No radiopaque foreign body or other acute/significant findings.

## 2011-04-15 ENCOUNTER — Telehealth: Payer: Self-pay | Admitting: *Deleted

## 2011-04-15 ENCOUNTER — Ambulatory Visit (HOSPITAL_COMMUNITY)
Admission: RE | Admit: 2011-04-15 | Discharge: 2011-04-15 | Disposition: A | Discharge status: Home / Self Care | Payer: Medicare Other | Source: Ambulatory Visit | Attending: Internal Medicine | Admitting: Internal Medicine

## 2011-04-15 DIAGNOSIS — N281 Cyst of kidney, acquired: Secondary | ICD-10-CM | POA: Insufficient documentation

## 2011-04-15 DIAGNOSIS — N2889 Other specified disorders of kidney and ureter: Secondary | ICD-10-CM | POA: Insufficient documentation

## 2011-04-15 DIAGNOSIS — K8020 Calculus of gallbladder without cholecystitis without obstruction: Secondary | ICD-10-CM | POA: Insufficient documentation

## 2011-04-15 DIAGNOSIS — R1033 Periumbilical pain: Secondary | ICD-10-CM

## 2011-04-15 NOTE — Telephone Encounter (Signed)
Message copied by Daphine Deutscher on Tue Apr 15, 2011  4:41 PM ------      Message from: Hart Carwin      Created: Tue Apr 15, 2011  4:39 PM       Please call pt with results of abd. 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 I recommend for her to  Proceed with the colonoscopy as we discussed, then decide about the gall bladder.

## 2011-04-15 NOTE — Telephone Encounter (Signed)
Left a message for patient to call me. 

## 2011-04-15 NOTE — Telephone Encounter (Signed)
Spoke with patient's husband re: results. Patient had her colonoscopy on 04/10/11 which was normal. Wants to know if she needs anything further. Please, advise

## 2011-04-15 NOTE — Telephone Encounter (Signed)
I am sorry, I did not realize we just did the colonoscopy, which was completely normal.At this point there is a strong possibility of her symptoms being due to gall stones. She may need a lap chole but since she is on Coumadin and would have to be bridged, I would obtain a HIDA scan with CCK to assess if gall bladder functions. She ought to stay on strict low fat diet.

## 2011-04-16 ENCOUNTER — Telehealth: Payer: Self-pay | Admitting: *Deleted

## 2011-04-16 NOTE — Telephone Encounter (Signed)
Left a message for patient that her insurance has not approved the HIDA with CCK and will have to cancel the test and reschedule in January. Spoke with Lyla Son at [facility] and rescheduled on 05/13/10 9:45 AM/10:00 AM. NPO after midnight.

## 2011-04-16 NOTE — Telephone Encounter (Signed)
Spoke with patient and her husband and gave them the rescheduled date and instructions.

## 2011-04-16 NOTE — Telephone Encounter (Signed)
Scheduled HIDA withCCK(torie) at [facility] radiology on 04/17/11 9:45/10:00 AM. NPO after midnight. No pain medications. Spoke with patient and her husband and gave her Dr. Regino Schultze recommendations, appointment date and instructions. Mailed patient a low fat diet booklet.

## 2011-04-17 ENCOUNTER — Other Ambulatory Visit (HOSPITAL_COMMUNITY): Payer: Medicare Other

## 2011-04-21 ENCOUNTER — Telehealth: Payer: Self-pay | Admitting: *Deleted

## 2011-04-21 DIAGNOSIS — K8020 Calculus of gallbladder without cholecystitis without obstruction: Secondary | ICD-10-CM

## 2011-04-21 NOTE — Telephone Encounter (Signed)
OK to do plain HIDA

## 2011-04-21 NOTE — Telephone Encounter (Signed)
Spoke to [facility]. She has changed patient's exam to HIDA without CCK. I have also advised our Contractor of the change.

## 2011-04-21 NOTE — Telephone Encounter (Signed)
Patient's insurance company does not want to cover a HIDA with CCK. They will approve a HIDA WITHOUT CCK. Do you want to proceed with HIDA WITHOUT CCK or do you want to request a review of coverage?

## 2011-04-22 ENCOUNTER — Ambulatory Visit: Payer: Medicare Other | Admitting: Internal Medicine

## 2011-04-23 ENCOUNTER — Encounter: Payer: Self-pay | Admitting: Internal Medicine

## 2011-04-23 ENCOUNTER — Ambulatory Visit (INDEPENDENT_AMBULATORY_CARE_PROVIDER_SITE_OTHER): Payer: Medicare Other | Admitting: Internal Medicine

## 2011-04-23 DIAGNOSIS — I6350 Cerebral infarction due to unspecified occlusion or stenosis of unspecified cerebral artery: Secondary | ICD-10-CM

## 2011-04-23 DIAGNOSIS — D443 Neoplasm of uncertain behavior of pituitary gland: Secondary | ICD-10-CM

## 2011-04-23 DIAGNOSIS — I10 Essential (primary) hypertension: Secondary | ICD-10-CM

## 2011-04-23 DIAGNOSIS — R894 Abnormal immunological findings in specimens from other organs, systems and tissues: Secondary | ICD-10-CM

## 2011-04-23 DIAGNOSIS — I639 Cerebral infarction, unspecified: Secondary | ICD-10-CM

## 2011-04-23 DIAGNOSIS — E785 Hyperlipidemia, unspecified: Secondary | ICD-10-CM

## 2011-04-23 DIAGNOSIS — R760 Raised antibody titer: Secondary | ICD-10-CM

## 2011-04-23 DIAGNOSIS — D444 Neoplasm of uncertain behavior of craniopharyngeal duct: Secondary | ICD-10-CM

## 2011-04-23 DIAGNOSIS — Z23 Encounter for immunization: Secondary | ICD-10-CM

## 2011-04-23 LAB — CBC WITH DIFFERENTIAL/PLATELET
Basophils Relative: 0.7 % (ref 0.0–3.0)
Eosinophils Absolute: 0.1 10*3/uL (ref 0.0–0.7)
Lymphocytes Relative: 29.6 % (ref 12.0–46.0)
Lymphs Abs: 1.8 10*3/uL (ref 0.7–4.0)
MCV: 93.7 fl (ref 78.0–100.0)
Neutro Abs: 3.4 10*3/uL (ref 1.4–7.7)
RDW: 12.9 % (ref 11.5–14.6)
WBC: 6 10*3/uL (ref 4.5–10.5)

## 2011-04-23 LAB — HEPATIC FUNCTION PANEL
ALT: 40 U/L — ABNORMAL HIGH (ref 0–35)
Total Bilirubin: 0.6 mg/dL (ref 0.3–1.2)

## 2011-04-23 LAB — BASIC METABOLIC PANEL
BUN: 16 mg/dL (ref 6–23)
Calcium: 9 mg/dL (ref 8.4–10.5)
Chloride: 104 mEq/L (ref 96–112)
Creatinine, Ser: 0.9 mg/dL (ref 0.4–1.2)

## 2011-04-23 LAB — LIPID PANEL
HDL: 63.7 mg/dL (ref >39.00)
Total CHOL/HDL Ratio: 3
Triglycerides: 165 mg/dL — ABNORMAL HIGH (ref 0.0–149.0)

## 2011-04-23 NOTE — Assessment & Plan Note (Signed)
BP Readings from Last 3 Encounters:  04/23/11 116/74  04/10/11 121/60  04/07/11 118/70   Controlled Continue same meds

## 2011-04-23 NOTE — Assessment & Plan Note (Signed)
Lab Results  Component Value Date   INR 2.70 04/11/2011   INR 2.7 04/11/2011   INR 2.80 03/12/2011   PROTIME 32.4* 04/11/2011   PROTIME 33.6* 03/12/2011   PROTIME 37.2* 02/19/2011   Continue warfarin

## 2011-04-23 NOTE — Assessment & Plan Note (Signed)
No recurrent sxs She has done very well clinically

## 2011-04-23 NOTE — Patient Instructions (Signed)
Call your insurance company and see if they will cover shingles vaccine. If they will, call us and we will give it to you  

## 2011-04-23 NOTE — Assessment & Plan Note (Signed)
Check labs today.

## 2011-04-29 ENCOUNTER — Other Ambulatory Visit: Payer: Self-pay | Admitting: Internal Medicine

## 2011-04-29 NOTE — Progress Notes (Signed)
Patient ID: Amanda Turner, female   DOB: 11-30-1945, 65 y.o.   MRN: [id number] Stroke--no recurrence HTN--no home Bps Lipids-tolerating meds Chronic anticoagulation--no bleeding on meds  Past Medical History  Diagnosis Date  . Depression   . Hyperlipemia   . Hypertension   . Lupus   . Elevated LFT's   . HA (headache)   . Lupus anticoagulant disorder   . Stroke   . 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 Anxiety     History   Social History  . Marital Status: Married    Spouse Name: N/A    Number of Children: 2  . Years of Education: N/A   Occupational History  .     Social History Main Topics  . Smoking status: Never Smoker   . Smokeless tobacco: Never Used  . Alcohol Use: No  . Drug Use: No  . Sexually Active: Not on file   Other Topics Concern  . Not on file   Social History Narrative   2 cups of coffee daily    Past Surgical History  Procedure Date  . Abdominal hysterectomy   . Transphenoidal pituitary resection     Family History  Problem Relation Age of Onset  . Diabetes Brother   . Diabetes Sister   . Hypertension Mother   . Colon cancer Neg Hx     Allergies  Allergen Reactions  . Peanut Oil     REACTION: hives  . Shrimp Flavor     hives  . Sulfonamide Derivatives     REACTION: shortness of breath    Current Outpatient Prescriptions on File Prior to Visit  Medication Sig Dispense Refill  . ALPRAZolam (XANAX) 0.5 MG tablet Take 1 tablet (0.5 mg total) by mouth daily.  30 tablet  1  . aspirin 81 MG tablet Take 81 mg by mouth daily.        . baclofen (LIORESAL) 10 MG tablet 5 mg daily.       . bifidobacterium infantis (ALIGN) capsule Take 1 capsule by mouth daily.  7 capsule  0  . citalopram (CELEXA) 40 MG tablet        . famotidine (PEPCID) 20 MG tablet TAKE 1 TABLET BY MOUTH TWO TIMES A DAY  60 tablet  4  . hydrochlorothiazide (HYDRODIURIL) 25 MG tablet Take 1 tablet (25 mg total) by mouth daily.  30  tablet  1  . potassium chloride SA (K-DUR,KLOR-CON) 20 MEQ tablet Take 20 mEq by mouth daily.       . simvastatin (ZOCOR) 20 MG tablet TAKE 1 TABLET BY MOUTH AT BEDTIME  90 tablet  3  . warfarin (COUMADIN) 5 MG tablet Take 5 mg by mouth daily.           patient denies chest pain, shortness of breath, orthopnea. Denies lower extremity edema, abdominal pain, change in appetite, change in bowel movements. Patient denies rashes, musculoskeletal complaints. No other specific complaints in a complete review of systems.   BP 116/74  Pulse 72  Temp(Src) 98.3 F (36.8 C) (Oral)  Ht 5\' 4"  (1.626 m)  Wt 206 lb (93.441 kg)  BMI 35.36 kg/m2  Well-developed well-nourished female in no acute distress. HEENT exam atraumatic, normocephalic, extraocular muscles are intact. Neck is supple. No jugular venous distention no thyromegaly. Chest clear to auscultation without increased work of breathing. Cardiac exam S1 and S2 are regular. Abdominal exam active bowel sounds, soft, nontender. Extremities no edema.

## 2011-05-05 ENCOUNTER — Other Ambulatory Visit: Payer: Self-pay | Admitting: Internal Medicine

## 2011-05-09 ENCOUNTER — Ambulatory Visit: Payer: Medicare Other

## 2011-05-09 ENCOUNTER — Other Ambulatory Visit: Payer: Self-pay | Admitting: Pharmacist

## 2011-05-09 ENCOUNTER — Other Ambulatory Visit (HOSPITAL_BASED_OUTPATIENT_CLINIC_OR_DEPARTMENT_OTHER): Payer: Medicare Other | Admitting: Lab

## 2011-05-09 ENCOUNTER — Ambulatory Visit (HOSPITAL_BASED_OUTPATIENT_CLINIC_OR_DEPARTMENT_OTHER): Payer: Self-pay | Admitting: Oncology

## 2011-05-09 DIAGNOSIS — I6350 Cerebral infarction due to unspecified occlusion or stenosis of unspecified cerebral artery: Secondary | ICD-10-CM

## 2011-05-09 DIAGNOSIS — R760 Raised antibody titer: Secondary | ICD-10-CM

## 2011-05-09 DIAGNOSIS — D6859 Other primary thrombophilia: Secondary | ICD-10-CM

## 2011-05-09 DIAGNOSIS — I639 Cerebral infarction, unspecified: Secondary | ICD-10-CM

## 2011-05-09 DIAGNOSIS — R894 Abnormal immunological findings in specimens from other organs, systems and tissues: Secondary | ICD-10-CM

## 2011-05-09 DIAGNOSIS — D6862 Lupus anticoagulant syndrome: Secondary | ICD-10-CM

## 2011-05-09 LAB — PROTIME-INR: INR: 2.8 (ref 2.00–3.50)

## 2011-05-09 NOTE — Progress Notes (Signed)
Pt doing very well on current dose of Coumadin. No change to dose needed. Return in 6 weeks. Marily Lente, Pharm.D.

## 2011-05-12 ENCOUNTER — Telehealth: Payer: Self-pay | Admitting: Family Medicine

## 2011-05-12 NOTE — Telephone Encounter (Signed)
Pt states that 2wks ago, Dr. Cato Mulligan told her to come back in 2wks to have K+ rechecked. I did not see a potassium order. Please advise and call pt. Thanks!

## 2011-05-12 NOTE — Telephone Encounter (Signed)
Pt needs an appt for a BMET

## 2011-05-13 ENCOUNTER — Other Ambulatory Visit (INDEPENDENT_AMBULATORY_CARE_PROVIDER_SITE_OTHER): Payer: Medicare Other

## 2011-05-13 DIAGNOSIS — I10 Essential (primary) hypertension: Secondary | ICD-10-CM

## 2011-05-13 LAB — BASIC METABOLIC PANEL
Chloride: 108 mEq/L (ref 96–112)
GFR: 67.64 mL/min (ref >60.00)
Glucose, Bld: 116 mg/dL — ABNORMAL HIGH (ref 70–99)
Potassium: 3.6 mEq/L (ref 3.5–5.1)
Sodium: 143 mEq/L (ref 135–145)

## 2011-05-14 ENCOUNTER — Ambulatory Visit (HOSPITAL_COMMUNITY): Payer: Medicare Other

## 2011-05-14 ENCOUNTER — Other Ambulatory Visit (HOSPITAL_COMMUNITY): Payer: Medicare Other

## 2011-05-30 ENCOUNTER — Other Ambulatory Visit: Payer: Self-pay | Admitting: *Deleted

## 2011-05-30 MED ORDER — FAMOTIDINE 20 MG PO TABS: 20.0000 mg | ORAL_TABLET | Freq: Two times a day (BID) | ORAL | Status: DC

## 2011-06-13 ENCOUNTER — Other Ambulatory Visit: Payer: Self-pay | Admitting: *Deleted

## 2011-06-13 DIAGNOSIS — F419 Anxiety disorder, unspecified: Secondary | ICD-10-CM

## 2011-06-13 MED ORDER — ALPRAZOLAM 0.5 MG PO TABS: 0.5000 mg | ORAL_TABLET | Freq: Every day | ORAL | Status: DC

## 2011-06-18 ENCOUNTER — Other Ambulatory Visit: Payer: Self-pay | Admitting: Pharmacist

## 2011-06-18 DIAGNOSIS — I639 Cerebral infarction, unspecified: Secondary | ICD-10-CM

## 2011-06-18 DIAGNOSIS — R760 Raised antibody titer: Secondary | ICD-10-CM

## 2011-06-19 ENCOUNTER — Telehealth: Payer: Self-pay | Admitting: *Deleted

## 2011-06-19 NOTE — Telephone Encounter (Signed)
Pt was suppose to go to [employer] in January and she didn't.  Pt's husband is in the process of trying to get things cleared up for her but he would like Dr Cato Mulligan to write a letter explaining her stroke.

## 2011-06-19 NOTE — Telephone Encounter (Signed)
Ok to write a letter from me to excuse her from jury duty. Patient has had a stroke

## 2011-06-20 ENCOUNTER — Ambulatory Visit (HOSPITAL_BASED_OUTPATIENT_CLINIC_OR_DEPARTMENT_OTHER): Payer: Medicare Other | Admitting: Pharmacist

## 2011-06-20 ENCOUNTER — Other Ambulatory Visit: Payer: Medicare Other | Admitting: Lab

## 2011-06-20 ENCOUNTER — Encounter: Payer: Self-pay | Admitting: *Deleted

## 2011-06-20 DIAGNOSIS — I639 Cerebral infarction, unspecified: Secondary | ICD-10-CM

## 2011-06-20 DIAGNOSIS — I6350 Cerebral infarction due to unspecified occlusion or stenosis of unspecified cerebral artery: Secondary | ICD-10-CM

## 2011-06-20 DIAGNOSIS — R760 Raised antibody titer: Secondary | ICD-10-CM

## 2011-06-20 DIAGNOSIS — R894 Abnormal immunological findings in specimens from other organs, systems and tissues: Secondary | ICD-10-CM

## 2011-06-20 DIAGNOSIS — Z7901 Long term (current) use of anticoagulants: Secondary | ICD-10-CM

## 2011-06-20 LAB — PROTIME-INR: INR: 2.5 (ref 2.00–3.50)

## 2011-06-20 NOTE — Progress Notes (Signed)
Continue taking 5mg daily except 2.5mg on Thursdays. Recheck INR in 6 weeks on July 31, 2011 at 1pm. 

## 2011-06-20 NOTE — Telephone Encounter (Signed)
Letter ready for pick up and husband is aware 

## 2011-06-20 NOTE — Patient Instructions (Signed)
Continue taking 5mg  daily except 2.5mg  on Thursdays. Recheck INR in 6 weeks on July 31, 2011 at 1pm.

## 2011-06-23 ENCOUNTER — Telehealth: Payer: Self-pay | Admitting: Oncology

## 2011-06-23 ENCOUNTER — Encounter: Payer: Self-pay | Admitting: Oncology

## 2011-06-23 ENCOUNTER — Ambulatory Visit (HOSPITAL_BASED_OUTPATIENT_CLINIC_OR_DEPARTMENT_OTHER): Payer: Medicare Other | Admitting: Oncology

## 2011-06-23 VITALS — BP 124/64 | HR 58 | Temp 97.8°F | Ht 64.0 in | Wt 209.5 lb

## 2011-06-23 DIAGNOSIS — L930 Discoid lupus erythematosus: Secondary | ICD-10-CM

## 2011-06-23 DIAGNOSIS — Z8679 Personal history of other diseases of the circulatory system: Secondary | ICD-10-CM

## 2011-06-23 DIAGNOSIS — D6859 Other primary thrombophilia: Secondary | ICD-10-CM

## 2011-06-23 DIAGNOSIS — D6862 Lupus anticoagulant syndrome: Secondary | ICD-10-CM

## 2011-06-23 HISTORY — DX: Personal history of other diseases of the circulatory system: Z86.79

## 2011-06-23 HISTORY — DX: Lupus anticoagulant syndrome: D68.62

## 2011-06-23 HISTORY — DX: Discoid lupus erythematosus: L93.0

## 2011-06-23 NOTE — Progress Notes (Signed)
Hematology and Oncology Follow Up Visit  Amanda Turner [id number] 01/12/1946 66 y.o. 06/23/2011 9:10 PM   Principle Diagnosis: Encounter Diagnoses  Name Primary?  . Intracranial hemorrhage, spontaneous intraparenchymal, associated with coagulopathy, remote, resolved Yes  . Lupus anticoagulant with hypercoagulable state   . Lupus erythematosus      Interim History:   Followup visit for this 66 year old woman with history of lupus. 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 She did developed a number of significant perioperative complications including initial CSF leak then subsequent hemorrhagic infarct and then multifocal thrombotic infarcts in her brain. Decision was made to cautiously anticoagulate her. She was found to have a positive lupus type anticoagulant. She has some mild fixed neurologic deficits since that surgery with partial expressive aphasia which is worse when she gets tired. Some mild clumsiness on rapid alternating movements of her left hand which on today's exam are greatly improved. Her speech today is really the best since I have known her. She continues on chronic full dose Coumadin anticoagulation and the drug is monitored through our office. She is also on 81 mg of aspirin.  Medications: reviewed  Allergies:  Allergies  Allergen Reactions  . Peanut Oil     REACTION: hives  . Shrimp Flavor     hives  . Sulfonamide Derivatives     REACTION: shortness of breath    Review of Systems: Constitutional:   No constitutional symptoms Respiratory: No cough or dyspnea Cardiovascular:  No chest pain or palpitations Gastrointestinal: No change in bowel habit no hematochezia or melena Genito-Urinary: No urinary tract symptoms no vaginal bleeding Musculoskeletal: No muscle or bone pain Neurologic: See above Skin: No rash or ecchymosis Remaining ROS negative.  Physical Exam: Blood pressure 124/64, pulse 58,  temperature 97.8 F (36.6 C), temperature source Oral, height 5\' 4"  (1.626 m), weight 209 lb 8 oz (95.029 kg). Wt Readings from Last 3 Encounters:  06/23/11 209 lb 8 oz (95.029 kg)  04/23/11 206 lb (93.441 kg)  04/10/11 208 lb (94.348 kg)     General appearance: Well-nourished woman HENNT: Pupils equal round reactive to light I could not get a good look at the fundi due to bilateral cataracts. Lymph nodes: No lymphadenopathy Breasts: Not examined Lungs clear to auscultation resonant to percussion: Heart: Regular rhythm no murmur or gallop Abdomen: Soft nontender no mass no organomegaly Extremities: No edema no calf tenderness Vascular: No cyanosis Neurologic: Mental status intact cranial nerves grossly normal visual field testing in vision testing not done motor strength is 5 over 5 upper body coordination finger to finger finger to hand and rapid alternating movements all normal at this time. Reflexes 2+ symmetric. Skin: No rash or ecchymosis  Lab Results: Lab Results  Component Value Date   WBC 6.0 04/23/2011   HGB 14.6 04/23/2011   HCT 43.1 04/23/2011   MCV 93.7 04/23/2011   PLT 188.0 04/23/2011     Chemistry      Component Value Date/Time   NA 143 05/13/2011 1116   K 3.6 05/13/2011 1116   CL 108 05/13/2011 1116   CO2 29 05/13/2011 1116   BUN 19 05/13/2011 1116   CREATININE 0.9 05/13/2011 1116      Component Value Date/Time   CALCIUM 9.0 05/13/2011 1116   ALKPHOS 79 04/23/2011 1035   AST 32 04/23/2011 1035   ALT 40* 04/23/2011 1035   BILITOT 0.6 04/23/2011 1035       Impression and Plan: #1. 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  #2 status post resection pituitary adenoma with complications as outlined above. She remains neurologically stable at this time with mild chronic fixed deficits.  #3. Lupus erythematosus  #4 reactive depression  #5 hyperlipidemia  Plan is continue long-term anticoagulation with Coumadin and  aspirin.   CC:. Dr. Marlowe Shores; Dr. Smitty Cords sores   Levert Feinstein, MD 2/18/20139:10 PM

## 2011-06-23 NOTE — Telephone Encounter (Signed)
appts made and printed for 12/17/11  aom

## 2011-06-25 ENCOUNTER — Other Ambulatory Visit: Payer: Self-pay | Admitting: Pharmacist

## 2011-06-25 DIAGNOSIS — R894 Abnormal immunological findings in specimens from other organs, systems and tissues: Secondary | ICD-10-CM

## 2011-06-25 DIAGNOSIS — D6859 Other primary thrombophilia: Secondary | ICD-10-CM

## 2011-06-25 DIAGNOSIS — I6350 Cerebral infarction due to unspecified occlusion or stenosis of unspecified cerebral artery: Secondary | ICD-10-CM

## 2011-06-29 ENCOUNTER — Other Ambulatory Visit: Payer: Self-pay | Admitting: Oncology

## 2011-07-03 ENCOUNTER — Other Ambulatory Visit: Payer: Self-pay | Admitting: Internal Medicine

## 2011-07-31 ENCOUNTER — Ambulatory Visit: Payer: Medicare Other

## 2011-07-31 ENCOUNTER — Other Ambulatory Visit: Payer: Medicare Other | Admitting: Lab

## 2011-08-04 ENCOUNTER — Ambulatory Visit (HOSPITAL_BASED_OUTPATIENT_CLINIC_OR_DEPARTMENT_OTHER): Payer: Medicare Other | Admitting: Pharmacist

## 2011-08-04 ENCOUNTER — Other Ambulatory Visit: Payer: Medicare Other | Admitting: Lab

## 2011-08-04 DIAGNOSIS — Z5181 Encounter for therapeutic drug level monitoring: Secondary | ICD-10-CM

## 2011-08-04 DIAGNOSIS — R760 Raised antibody titer: Secondary | ICD-10-CM

## 2011-08-04 DIAGNOSIS — Z7901 Long term (current) use of anticoagulants: Secondary | ICD-10-CM

## 2011-08-04 DIAGNOSIS — R894 Abnormal immunological findings in specimens from other organs, systems and tissues: Secondary | ICD-10-CM

## 2011-08-04 DIAGNOSIS — D6859 Other primary thrombophilia: Secondary | ICD-10-CM

## 2011-08-04 DIAGNOSIS — I6350 Cerebral infarction due to unspecified occlusion or stenosis of unspecified cerebral artery: Secondary | ICD-10-CM

## 2011-08-04 DIAGNOSIS — I639 Cerebral infarction, unspecified: Secondary | ICD-10-CM

## 2011-08-04 LAB — PROTIME-INR: INR: 2.7 (ref 2.00–3.50)

## 2011-08-04 NOTE — Progress Notes (Signed)
INR therapeutic (2.7).  No missed doses.  No complaints.  Will continue current dose of 5mg  daily except 2.5mg  on Th.  Will recheck INR in 6 weeks.

## 2011-08-14 ENCOUNTER — Telehealth: Payer: Self-pay | Admitting: *Deleted

## 2011-08-14 NOTE — Telephone Encounter (Signed)
Pt is asking if Dr. Cato Mulligan will increase her Hctz 25 mg to 50 mg due to swelling all over body seeming to become worse.

## 2011-08-15 NOTE — Telephone Encounter (Signed)
LMTCB for appt.

## 2011-08-15 NOTE — Telephone Encounter (Signed)
No, increased dose of hctz would not be effective.  Schedule OV... No urgency

## 2011-08-18 NOTE — Telephone Encounter (Signed)
LM TCB for an appt 

## 2011-08-19 NOTE — Telephone Encounter (Signed)
Needs office visit appt. Notified pt.

## 2011-09-07 ENCOUNTER — Other Ambulatory Visit: Payer: Self-pay | Admitting: Internal Medicine

## 2011-09-16 ENCOUNTER — Ambulatory Visit (HOSPITAL_BASED_OUTPATIENT_CLINIC_OR_DEPARTMENT_OTHER): Payer: Medicare Other | Admitting: Pharmacist

## 2011-09-16 ENCOUNTER — Other Ambulatory Visit (HOSPITAL_BASED_OUTPATIENT_CLINIC_OR_DEPARTMENT_OTHER): Payer: Medicare Other | Admitting: Lab

## 2011-09-16 DIAGNOSIS — R760 Raised antibody titer: Secondary | ICD-10-CM

## 2011-09-16 DIAGNOSIS — I6350 Cerebral infarction due to unspecified occlusion or stenosis of unspecified cerebral artery: Secondary | ICD-10-CM

## 2011-09-16 DIAGNOSIS — I639 Cerebral infarction, unspecified: Secondary | ICD-10-CM

## 2011-09-16 DIAGNOSIS — D6859 Other primary thrombophilia: Secondary | ICD-10-CM

## 2011-09-16 DIAGNOSIS — R894 Abnormal immunological findings in specimens from other organs, systems and tissues: Secondary | ICD-10-CM

## 2011-09-16 NOTE — Progress Notes (Signed)
INR = 2.7 on 5 mg/day; 2.5 mg on Thurs. Doing great.  No complaints or concerns re: anticoag. INR stable.  No dose change. Return in 6 weeks. Marily Lente, Pharm.D.

## 2011-09-22 ENCOUNTER — Other Ambulatory Visit: Payer: Self-pay | Admitting: Internal Medicine

## 2011-10-18 ENCOUNTER — Other Ambulatory Visit: Payer: Self-pay | Admitting: Oncology

## 2011-10-26 ENCOUNTER — Other Ambulatory Visit: Payer: Self-pay | Admitting: Internal Medicine

## 2011-10-28 ENCOUNTER — Ambulatory Visit (HOSPITAL_BASED_OUTPATIENT_CLINIC_OR_DEPARTMENT_OTHER): Payer: Medicare Other | Admitting: Pharmacist

## 2011-10-28 ENCOUNTER — Other Ambulatory Visit (HOSPITAL_BASED_OUTPATIENT_CLINIC_OR_DEPARTMENT_OTHER): Payer: Medicare Other | Admitting: Lab

## 2011-10-28 DIAGNOSIS — I6350 Cerebral infarction due to unspecified occlusion or stenosis of unspecified cerebral artery: Secondary | ICD-10-CM

## 2011-10-28 DIAGNOSIS — D6859 Other primary thrombophilia: Secondary | ICD-10-CM

## 2011-10-28 DIAGNOSIS — R894 Abnormal immunological findings in specimens from other organs, systems and tissues: Secondary | ICD-10-CM

## 2011-10-28 DIAGNOSIS — I639 Cerebral infarction, unspecified: Secondary | ICD-10-CM

## 2011-10-28 DIAGNOSIS — R760 Raised antibody titer: Secondary | ICD-10-CM

## 2011-10-28 LAB — PROTIME-INR
INR: 2.5 (ref 2.00–3.50)
Protime: 30 Seconds — ABNORMAL HIGH (ref 10.6–13.4)

## 2011-10-28 NOTE — Progress Notes (Signed)
INR = 2.5 maintained on 5 mg/day; 2.5 mg Thurs. Pts only concern today is progressive weight gain. INR stable.  Cont same dose. Return in 2 months.  Sees Lonna Cobb same day (12/17/11). Marily Lente, Pharm.D.

## 2011-11-04 ENCOUNTER — Other Ambulatory Visit: Payer: Self-pay | Admitting: *Deleted

## 2011-11-04 DIAGNOSIS — F419 Anxiety disorder, unspecified: Secondary | ICD-10-CM

## 2011-11-04 MED ORDER — ALPRAZOLAM 0.5 MG PO TABS: 0.5000 mg | ORAL_TABLET | Freq: Every day | ORAL | Status: DC

## 2011-11-16 ENCOUNTER — Other Ambulatory Visit: Payer: Self-pay | Admitting: Internal Medicine

## 2011-11-28 ENCOUNTER — Encounter: Payer: Self-pay | Admitting: Internal Medicine

## 2011-11-28 ENCOUNTER — Ambulatory Visit (INDEPENDENT_AMBULATORY_CARE_PROVIDER_SITE_OTHER): Payer: Medicare Other | Admitting: Internal Medicine

## 2011-11-28 VITALS — BP 118/80 | Temp 97.8°F | Wt 214.0 lb

## 2011-11-28 DIAGNOSIS — D6862 Lupus anticoagulant syndrome: Secondary | ICD-10-CM

## 2011-11-28 DIAGNOSIS — E785 Hyperlipidemia, unspecified: Secondary | ICD-10-CM

## 2011-11-28 DIAGNOSIS — Z79899 Other long term (current) drug therapy: Secondary | ICD-10-CM

## 2011-11-28 DIAGNOSIS — E8881 Metabolic syndrome: Secondary | ICD-10-CM | POA: Insufficient documentation

## 2011-11-28 DIAGNOSIS — I10 Essential (primary) hypertension: Secondary | ICD-10-CM

## 2011-11-28 DIAGNOSIS — D6859 Other primary thrombophilia: Secondary | ICD-10-CM

## 2011-11-28 LAB — HEMOGLOBIN A1C: Hgb A1c MFr Bld: 5.5 % (ref 4.6–6.5)

## 2011-11-28 LAB — BASIC METABOLIC PANEL
BUN: 20 mg/dL (ref 6–23)
CO2: 28 mEq/L (ref 19–32)
Chloride: 106 mEq/L (ref 96–112)
Creatinine, Ser: 0.7 mg/dL (ref 0.4–1.2)

## 2011-11-28 LAB — HEPATIC FUNCTION PANEL
ALT: 57 U/L — ABNORMAL HIGH (ref 0–35)
Total Bilirubin: 0.7 mg/dL (ref 0.3–1.2)

## 2011-11-28 LAB — LIPID PANEL
Cholesterol: 182 mg/dL (ref 0–200)
LDL Cholesterol: 95 mg/dL (ref 0–99)
Triglycerides: 129 mg/dL (ref 0.0–149.0)

## 2011-11-28 NOTE — Assessment & Plan Note (Signed)
bp well controlled Continue same meds

## 2011-11-28 NOTE — Assessment & Plan Note (Signed)
She has regular protime at [facility] Lupus anticoagulant likely contributed/casude her stroke.

## 2011-11-28 NOTE — Assessment & Plan Note (Signed)
Tolerating meds-- needs labs

## 2011-11-28 NOTE — Progress Notes (Signed)
Subjective:    Patient ID: Amanda Turner, female    DOB: [birth year], 66 y.o.   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 During evaluation she was found to have lupus anticoagulant-- now on lifelong warfarin (Dr. Cyndie Chime).  Lipids-- needs f/u  Past Medical History  Diagnosis Date  . Depression   . Hyperlipemia   . Hypertension   . Lupus   . Elevated LFT's   . HA (headache)   . Lupus anticoagulant disorder   . Stroke   . 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 Anxiety   . Intracranial hemorrhage, spontaneous intraparenchymal, associated with coagulopathy, remote, resolved 06/23/2011  . Lupus anticoagulant with hypercoagulable state 06/23/2011  . Lupus erythematosus 06/23/2011    History   Social History  . Marital Status: Married    Spouse Name: N/A    Number of Children: 2  . Years of Education: N/A   Occupational History  .     Social History Main Topics  . Smoking status: Never Smoker   . Smokeless tobacco: Never Used  . Alcohol Use: No  . Drug Use: No  . Sexually Active: Not on file   Other Topics Concern  . Not on file   Social History Narrative   2 cups of coffee daily    Past Surgical History  Procedure Date  . Abdominal hysterectomy   . Transphenoidal pituitary resection     Family History  Problem Relation Age of Onset  . Diabetes Brother   . Diabetes Sister   . Hypertension Mother   . Colon cancer Neg Hx     Allergies  Allergen Reactions  . Peanut Oil     REACTION: hives  . Shrimp Flavor     hives  . Sulfonamide Derivatives     REACTION: shortness of breath    Current Outpatient Prescriptions on File Prior to Visit  Medication Sig Dispense Refill  . ALPRAZolam (XANAX) 0.5 MG tablet Take 1 tablet (0.5 mg total) by mouth daily.  30 tablet  0  . aspirin 81 MG tablet Take 81 mg by mouth daily.        . baclofen (LIORESAL) 10 MG tablet 10 mg daily.        . bifidobacterium infantis (ALIGN) capsule Take 1 capsule by mouth daily.  7 capsule  0  . citalopram (CELEXA) 40 MG tablet TAKE ONE AND 1/2 TABLETS BY MOUTH DAILY  45 tablet  3  . famotidine (PEPCID) 20 MG tablet TAKE 1 TABLET BY MOUTH TWICE A DAY  60 tablet  4  . hydrochlorothiazide (HYDRODIURIL) 25 MG tablet TAKE 1 TABLET BY MOUTH EVERY DAY  30 tablet  1  . potassium chloride SA (K-DUR,KLOR-CON) 20 MEQ tablet Take 20 mEq by mouth 2 (two) times daily.       . simvastatin (ZOCOR) 20 MG tablet TAKE 1 TABLET BY MOUTH AT BEDTIME  90 tablet  3  . warfarin (COUMADIN) 5 MG tablet TAKE 1 TABLET BY MOUTH EVERY DAY  30 tablet  3  . DISCONTD: citalopram (CELEXA) 40 MG tablet Take 20 mg by mouth 2 (two) times daily.          patient denies chest pain, shortness of breath, orthopnea. Denies lower extremity edema, abdominal pain, change in appetite, change in bowel movements. Patient denies rashes, musculoskeletal complaints. No other specific complaints in a complete review of systems.   BP 118/80  Temp  97.8 F (36.6 C) (Oral)  Wt 214 lb (97.07 kg)   Review of Systems     Objective:   Physical Exam    Well-developed well-nourished female in no acute distress. HEENT exam atraumatic, normocephalic, extraocular muscles are intact. Neck is supple. No jugular venous distention no thyromegaly. Chest clear to auscultation without increased work of breathing. Cardiac exam S1 and S2 are regular. Abdominal exam active bowel sounds, soft, nontender. Extremities no edema.    Assessment & Plan:

## 2011-12-03 ENCOUNTER — Other Ambulatory Visit: Payer: Self-pay | Admitting: *Deleted

## 2011-12-03 DIAGNOSIS — I10 Essential (primary) hypertension: Secondary | ICD-10-CM

## 2011-12-03 MED ORDER — LISINOPRIL 10 MG PO TABS: 10.0000 mg | ORAL_TABLET | Freq: Every day | ORAL | Status: DC

## 2011-12-09 ENCOUNTER — Telehealth: Payer: Self-pay | Admitting: Internal Medicine

## 2011-12-09 NOTE — Telephone Encounter (Signed)
Caller: Mark/Spouse; PCP: Birdie Sons; CB#: [phone]; ; ; Call regarding Husband Has Quite A. Few Questions About Medication and Supplements;  States patient was taken off Hydrochlorothiazide, and asks if she is supposed to continue the Potassium.  Reviewed medical record and Potassium appears on the list of medications as of 11/28/11 with no indication that it was to be discontinued.  Information noted and sent to office for review as the new blood pressure medication (lisinopril) can have a side effect of hyperkalemia.  Medication Questions -Adult protocol used.

## 2011-12-12 NOTE — Telephone Encounter (Signed)
Discontinue potassium. Please remove from medication list

## 2011-12-12 NOTE — Telephone Encounter (Signed)
Pt's husband aware, medicine removed from list

## 2011-12-17 ENCOUNTER — Ambulatory Visit: Payer: Medicare Other | Admitting: Pharmacist

## 2011-12-17 ENCOUNTER — Other Ambulatory Visit (HOSPITAL_BASED_OUTPATIENT_CLINIC_OR_DEPARTMENT_OTHER): Payer: Medicare Other | Admitting: Lab

## 2011-12-17 ENCOUNTER — Telehealth: Payer: Self-pay | Admitting: Oncology

## 2011-12-17 ENCOUNTER — Ambulatory Visit (HOSPITAL_BASED_OUTPATIENT_CLINIC_OR_DEPARTMENT_OTHER): Payer: Medicare Other | Admitting: Nurse Practitioner

## 2011-12-17 VITALS — BP 121/65 | HR 56 | Temp 97.5°F | Resp 20 | Ht 64.0 in | Wt 219.1 lb

## 2011-12-17 DIAGNOSIS — F3289 Other specified depressive episodes: Secondary | ICD-10-CM

## 2011-12-17 DIAGNOSIS — F329 Major depressive disorder, single episode, unspecified: Secondary | ICD-10-CM

## 2011-12-17 DIAGNOSIS — R760 Raised antibody titer: Secondary | ICD-10-CM

## 2011-12-17 DIAGNOSIS — Z8679 Personal history of other diseases of the circulatory system: Secondary | ICD-10-CM

## 2011-12-17 DIAGNOSIS — D6862 Lupus anticoagulant syndrome: Secondary | ICD-10-CM

## 2011-12-17 DIAGNOSIS — L930 Discoid lupus erythematosus: Secondary | ICD-10-CM

## 2011-12-17 DIAGNOSIS — D6859 Other primary thrombophilia: Secondary | ICD-10-CM

## 2011-12-17 DIAGNOSIS — R894 Abnormal immunological findings in specimens from other organs, systems and tissues: Secondary | ICD-10-CM

## 2011-12-17 DIAGNOSIS — I10 Essential (primary) hypertension: Secondary | ICD-10-CM

## 2011-12-17 DIAGNOSIS — I639 Cerebral infarction, unspecified: Secondary | ICD-10-CM

## 2011-12-17 DIAGNOSIS — I6350 Cerebral infarction due to unspecified occlusion or stenosis of unspecified cerebral artery: Secondary | ICD-10-CM

## 2011-12-17 LAB — POCT INR
INR: 2.3
INR: 2.3

## 2011-12-17 LAB — COMPREHENSIVE METABOLIC PANEL
CO2: 25 mEq/L (ref 19–32)
Calcium: 8.6 mg/dL (ref 8.4–10.5)
Creatinine, Ser: 0.7 mg/dL (ref 0.50–1.10)
Glucose, Bld: 96 mg/dL (ref 70–99)
Total Bilirubin: 0.5 mg/dL (ref 0.3–1.2)

## 2011-12-17 LAB — CBC WITH DIFFERENTIAL/PLATELET
Basophils Absolute: 0.1 10*3/uL (ref 0.0–0.1)
Eosinophils Absolute: 0.1 10*3/uL (ref 0.0–0.5)
HCT: 41.6 % (ref 34.8–46.6)
HGB: 14.2 g/dL (ref 11.6–15.9)
LYMPH%: 39 % (ref 14.0–49.7)
MONO#: 0.6 10*3/uL (ref 0.1–0.9)
NEUT#: 2.5 10*3/uL (ref 1.5–6.5)
NEUT%: 46.6 % (ref 38.4–76.8)
Platelets: 178 10*3/uL (ref 145–400)
WBC: 5.3 10*3/uL (ref 3.9–10.3)

## 2011-12-17 NOTE — Telephone Encounter (Signed)
gv pt appt schedule for February 2014. °

## 2011-12-17 NOTE — Progress Notes (Signed)
OFFICE PROGRESS NOTE  Interval history:  Amanda Turner is a 66 year old woman with a history of lupus. 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 She had a number of postoperative complications including initial CSF leak, then subsequent hemorrhagic infarct and then multifocal thrombotic infarcts in the brain. She was found to have a positive lupus type anticoagulant. She is maintained on chronic full dose Coumadin anticoagulation followed through our office.  [name] reports that she is feeling well. She denies bleeding. No shortness of breath or chest pain. She denies calf pain. She has noted bilateral ankle and pedal edema since recent discontinuation of hydrochlorothiazide and initiation of lisinopril. Partial expressive aphasia continues to be improved. She notes worsening when she is fatigued.   Objective: Blood pressure 121/65, pulse 56, temperature 97.5 F (36.4 C), temperature source Oral, resp. rate 20, height 5\' 4"  (1.626 m), weight 219 lb 1.6 oz (99.383 kg).  Pupils equal round and reactive to light. Extraocular movements intact. Sclera anicteric. Oropharynx is without thrush or ulceration. No palpable cervical, supraclavicular or axillary lymph nodes. Lungs are clear. No wheezes or rales. Regular cardiac rhythm. Abdomen is soft and nontender. No organomegaly. Pitting edema at the ankles and feet bilaterally. Calves nontender. Motor strength 5 over 5. Finger to nose intact. Clumsiness with rapid alternating hand movements.  Lab Results: Lab Results  Component Value Date   WBC 5.3 12/17/2011   HGB 14.2 12/17/2011   HCT 41.6 12/17/2011   MCV 93.0 12/17/2011   PLT 178 12/17/2011    Chemistry:    Chemistry      Component Value Date/Time   NA 141 11/28/2011 0917   K 3.3* 11/28/2011 0917   CL 106 11/28/2011 0917   CO2 28 11/28/2011 0917   BUN 20 11/28/2011 0917   CREATININE 0.7 11/28/2011 0917      Component Value Date/Time     CALCIUM 9.0 11/28/2011 0917   ALKPHOS 72 11/28/2011 0917   AST 36 11/28/2011 0917   ALT 57* 11/28/2011 0917   BILITOT 0.7 11/28/2011 0917       Studies/Results: No results found.  Medications: I have reviewed the patient's current medications.  Assessment/Plan:  1. 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 She continues full dose Coumadin anticoagulation and aspirin. 2. 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 3. Lupus erythematosus. 4. Depression. 5. Hyperlipidemia. 6. Hypertension with recent medication adjustment.  Disposition-plan to continue long-term Coumadin anticoagulation and aspirin. She will return for a followup visit with Dr. Cyndie Chime in 6 months. She will contact the office in the interim with any problems. She plans to contact Dr. Cato Mulligan regarding recent lower leg edema coinciding with blood pressure medication adjustment.  Lonna Cobb ANP/GNP-BC    CC: Dr. Marlowe Shores and Dr. Birdie Sons

## 2011-12-17 NOTE — Progress Notes (Signed)
INR at goal.  No changes.  Pt doing well.  Appt with Lonna Cobb, NP today.  Continue current coumadin dose.  Will check PT/INR in 6 weeks.

## 2011-12-18 ENCOUNTER — Other Ambulatory Visit: Payer: Medicare Other | Admitting: Lab

## 2011-12-18 ENCOUNTER — Ambulatory Visit: Payer: Medicare Other | Admitting: Nurse Practitioner

## 2011-12-18 ENCOUNTER — Telehealth: Payer: Self-pay | Admitting: Internal Medicine

## 2011-12-18 NOTE — Telephone Encounter (Signed)
Caller: Mark/Spouse; Patient Name: Amanda Turner; PCP: Birdie Sons; Best Callback Phone Number: [phone].  Spouse calling about increased swelling in ankles and wrists.  Pt. was taken off HCTZ on 12/03/11.  Since then pt. has progessively developed more edema.  NP at [facility] told Spouse to contact PCP about increased swelling.  No difficulty breathing.  Triaged with Edema; Atraumatic and dispostion for new swelling of legs that does not resolve with rest and elevation of legs:  See Provider within 24 hours.  No appointments available today or tomorrow.  Message sent to Nursing Pool via EPIC:  PLEASE WORK THIS PT. IN.  Home care instructions given.  CAN/db

## 2011-12-18 NOTE — Telephone Encounter (Signed)
Called pt to make appt tomorrow at 8:30 with Dr. Cato Mulligan. LMTCB.

## 2011-12-19 ENCOUNTER — Encounter: Payer: Self-pay | Admitting: Family Medicine

## 2011-12-19 ENCOUNTER — Ambulatory Visit (INDEPENDENT_AMBULATORY_CARE_PROVIDER_SITE_OTHER): Payer: Medicare Other | Admitting: Family Medicine

## 2011-12-19 VITALS — BP 90/68 | Temp 98.1°F | Wt 220.0 lb

## 2011-12-19 DIAGNOSIS — R609 Edema, unspecified: Secondary | ICD-10-CM

## 2011-12-19 DIAGNOSIS — I10 Essential (primary) hypertension: Secondary | ICD-10-CM

## 2011-12-19 DIAGNOSIS — E876 Hypokalemia: Secondary | ICD-10-CM

## 2011-12-19 DIAGNOSIS — R600 Localized edema: Secondary | ICD-10-CM

## 2011-12-19 MED ORDER — FUROSEMIDE 20 MG PO TABS: 20.0000 mg | ORAL_TABLET | Freq: Every day | ORAL | Status: DC

## 2011-12-19 MED ORDER — POTASSIUM CHLORIDE CRYS ER 20 MEQ PO TBCR: 20.0000 meq | EXTENDED_RELEASE_TABLET | Freq: Every day | ORAL | Status: DC

## 2011-12-19 NOTE — Progress Notes (Signed)
  Subjective:    Patient ID: Amanda Turner, female    DOB: 04-17-1946, 66 y.o.   MRN: [id number]  HPI Here for several weeks of swelling in the feet, legs, and hands. No SOB or chest pain. She had been on HCTZ long term for edema but this was stopped a few weeks ago by Dr. Cato Mulligan so she would not have to take a potassium supplement. She was put on Lisinopril instead. Now the edema has returned. She had labs drawn at Dr. Patsy Lager office 2 days ago, and her potassium was fine at 3.9. Her renal function is normal.    Review of Systems  Constitutional: Negative.   Respiratory: Negative.   Cardiovascular: Positive for leg swelling. Negative for chest pain and palpitations.       Objective:   Physical Exam  Constitutional: She appears well-developed and well-nourished.  Cardiovascular: Normal rate, regular rhythm, normal heart sounds and intact distal pulses.   Pulmonary/Chest: Effort normal and breath sounds normal. No respiratory distress. She has no wheezes. She has no rales.  Musculoskeletal:       2+ edema in both lower legs          Assessment & Plan:  She will need to get back on a diuretic so we will start her on Lasix 20 mg a day. Take one 20 mEq tablet of potassium a day with it. Recheck the potassium and the BP in 4-6 weeks.

## 2011-12-26 ENCOUNTER — Other Ambulatory Visit: Payer: Self-pay | Admitting: *Deleted

## 2011-12-26 DIAGNOSIS — F419 Anxiety disorder, unspecified: Secondary | ICD-10-CM

## 2011-12-26 MED ORDER — ALPRAZOLAM 0.5 MG PO TABS: 0.5000 mg | ORAL_TABLET | Freq: Every day | ORAL | Status: DC

## 2012-01-28 ENCOUNTER — Ambulatory Visit (HOSPITAL_BASED_OUTPATIENT_CLINIC_OR_DEPARTMENT_OTHER): Payer: Medicare Other | Admitting: Pharmacist

## 2012-01-28 ENCOUNTER — Other Ambulatory Visit (HOSPITAL_BASED_OUTPATIENT_CLINIC_OR_DEPARTMENT_OTHER): Payer: Medicare Other | Admitting: Lab

## 2012-01-28 DIAGNOSIS — R894 Abnormal immunological findings in specimens from other organs, systems and tissues: Secondary | ICD-10-CM

## 2012-01-28 DIAGNOSIS — Z7901 Long term (current) use of anticoagulants: Secondary | ICD-10-CM

## 2012-01-28 DIAGNOSIS — D6859 Other primary thrombophilia: Secondary | ICD-10-CM

## 2012-01-28 DIAGNOSIS — I6350 Cerebral infarction due to unspecified occlusion or stenosis of unspecified cerebral artery: Secondary | ICD-10-CM

## 2012-01-28 DIAGNOSIS — I639 Cerebral infarction, unspecified: Secondary | ICD-10-CM

## 2012-01-28 DIAGNOSIS — R760 Raised antibody titer: Secondary | ICD-10-CM

## 2012-01-28 LAB — PROTIME-INR

## 2012-01-28 NOTE — Progress Notes (Signed)
INR slightly below goal today.  Pt and husband feel they have eaten more broccoli and boston lettuce than usual lately. Pt has been stable on this dose for a long time. Will continue the same dose = 5mg  daily except 2.5mg  on Thursdays. Will recheck INR in 1 week to ensure INR returns to goal. Return 02/05/12 at 2:15 pm for lab & 2:30 pm for coumadin clinic.

## 2012-01-28 NOTE — Patient Instructions (Signed)
Continue taking 5mg  daily except 2.5mg  on Thursdays. Return 02/05/12 at 2:15 pm for lab & 2:30 pm for coumadin clinic.

## 2012-02-05 ENCOUNTER — Ambulatory Visit (HOSPITAL_BASED_OUTPATIENT_CLINIC_OR_DEPARTMENT_OTHER): Payer: Medicare Other | Admitting: Pharmacist

## 2012-02-05 ENCOUNTER — Other Ambulatory Visit (HOSPITAL_BASED_OUTPATIENT_CLINIC_OR_DEPARTMENT_OTHER): Payer: Medicare Other

## 2012-02-05 DIAGNOSIS — I6350 Cerebral infarction due to unspecified occlusion or stenosis of unspecified cerebral artery: Secondary | ICD-10-CM

## 2012-02-05 DIAGNOSIS — D6859 Other primary thrombophilia: Secondary | ICD-10-CM

## 2012-02-05 DIAGNOSIS — I639 Cerebral infarction, unspecified: Secondary | ICD-10-CM

## 2012-02-05 DIAGNOSIS — Z7901 Long term (current) use of anticoagulants: Secondary | ICD-10-CM

## 2012-02-05 DIAGNOSIS — R894 Abnormal immunological findings in specimens from other organs, systems and tissues: Secondary | ICD-10-CM

## 2012-02-05 DIAGNOSIS — R760 Raised antibody titer: Secondary | ICD-10-CM

## 2012-02-05 LAB — POCT INR: INR: 1.8

## 2012-02-05 LAB — PROTIME-INR

## 2012-02-05 NOTE — Progress Notes (Signed)
INR right below goal x 2 weeks.  Pt husband states that pt has been drinking [place] green tea 2 cups/day.  Pt and husband would like to try stopping the green tea this week vs increasing coumadin dose.  Will check PT/INR in 1 week.  If INR still below goal after no green tea, will increase coumadin dose.

## 2012-02-12 ENCOUNTER — Ambulatory Visit (HOSPITAL_BASED_OUTPATIENT_CLINIC_OR_DEPARTMENT_OTHER): Payer: Medicare Other | Admitting: Pharmacist

## 2012-02-12 ENCOUNTER — Other Ambulatory Visit (HOSPITAL_BASED_OUTPATIENT_CLINIC_OR_DEPARTMENT_OTHER): Payer: Medicare Other | Admitting: Lab

## 2012-02-12 DIAGNOSIS — D6859 Other primary thrombophilia: Secondary | ICD-10-CM

## 2012-02-12 DIAGNOSIS — R760 Raised antibody titer: Secondary | ICD-10-CM

## 2012-02-12 DIAGNOSIS — I639 Cerebral infarction, unspecified: Secondary | ICD-10-CM

## 2012-02-12 DIAGNOSIS — R894 Abnormal immunological findings in specimens from other organs, systems and tissues: Secondary | ICD-10-CM

## 2012-02-12 DIAGNOSIS — Z7901 Long term (current) use of anticoagulants: Secondary | ICD-10-CM

## 2012-02-12 DIAGNOSIS — I6350 Cerebral infarction due to unspecified occlusion or stenosis of unspecified cerebral artery: Secondary | ICD-10-CM

## 2012-02-12 LAB — PROTIME-INR: Protime: 22.8 Seconds — ABNORMAL HIGH (ref 10.6–13.4)

## 2012-02-12 LAB — POCT INR: INR: 1.9

## 2012-02-12 NOTE — Progress Notes (Signed)
Increase Coumadin to 5mg  daily. Recheck INR in 2 weeks. Return 02/26/12 at 2:15 pm for lab & 2:30 pm for coumadin clinic.

## 2012-02-12 NOTE — Patient Instructions (Signed)
Increase Coumadin to 5mg daily. Recheck INR in 2 weeks. Return 02/26/12 at 2:15 pm for lab & 2:30 pm for coumadin clinic. 

## 2012-02-26 ENCOUNTER — Ambulatory Visit (HOSPITAL_BASED_OUTPATIENT_CLINIC_OR_DEPARTMENT_OTHER): Payer: Medicare Other | Admitting: Pharmacist

## 2012-02-26 ENCOUNTER — Other Ambulatory Visit (HOSPITAL_BASED_OUTPATIENT_CLINIC_OR_DEPARTMENT_OTHER): Payer: Medicare Other | Admitting: Lab

## 2012-02-26 DIAGNOSIS — R760 Raised antibody titer: Secondary | ICD-10-CM

## 2012-02-26 DIAGNOSIS — Z7901 Long term (current) use of anticoagulants: Secondary | ICD-10-CM

## 2012-02-26 DIAGNOSIS — Z8679 Personal history of other diseases of the circulatory system: Secondary | ICD-10-CM

## 2012-02-26 DIAGNOSIS — D6862 Lupus anticoagulant syndrome: Secondary | ICD-10-CM

## 2012-02-26 DIAGNOSIS — I639 Cerebral infarction, unspecified: Secondary | ICD-10-CM

## 2012-02-26 DIAGNOSIS — L930 Discoid lupus erythematosus: Secondary | ICD-10-CM

## 2012-02-26 DIAGNOSIS — I6350 Cerebral infarction due to unspecified occlusion or stenosis of unspecified cerebral artery: Secondary | ICD-10-CM

## 2012-02-26 LAB — POCT INR: INR: 2.3

## 2012-02-26 NOTE — Progress Notes (Signed)
INR = 2.3 on 5 mg/day No complaints today.  Doing well. INR at goal.  Cont same dose & repeat INR in 3 weeks.  If INR remains at goal then, we can see her back in 1 month. Marily Lente, Pharm.D.

## 2012-03-13 ENCOUNTER — Other Ambulatory Visit: Payer: Self-pay | Admitting: Oncology

## 2012-03-14 ENCOUNTER — Other Ambulatory Visit: Payer: Self-pay | Admitting: Oncology

## 2012-03-18 ENCOUNTER — Ambulatory Visit: Payer: Medicare Other | Admitting: Pharmacist

## 2012-03-18 ENCOUNTER — Other Ambulatory Visit (HOSPITAL_BASED_OUTPATIENT_CLINIC_OR_DEPARTMENT_OTHER): Payer: Medicare Other | Admitting: Lab

## 2012-03-18 DIAGNOSIS — D6859 Other primary thrombophilia: Secondary | ICD-10-CM

## 2012-03-18 DIAGNOSIS — R760 Raised antibody titer: Secondary | ICD-10-CM

## 2012-03-18 DIAGNOSIS — I6350 Cerebral infarction due to unspecified occlusion or stenosis of unspecified cerebral artery: Secondary | ICD-10-CM

## 2012-03-18 DIAGNOSIS — R894 Abnormal immunological findings in specimens from other organs, systems and tissues: Secondary | ICD-10-CM

## 2012-03-18 DIAGNOSIS — I639 Cerebral infarction, unspecified: Secondary | ICD-10-CM

## 2012-03-18 LAB — POCT INR: INR: 2.3

## 2012-03-18 LAB — PROTIME-INR: Protime: 27.6 Seconds — ABNORMAL HIGH (ref 10.6–13.4)

## 2012-03-18 NOTE — Progress Notes (Signed)
No complaints or changes to report.  Pt accompanied by her husband.  She will continue 5 mg daily and RTC in 4 weeks, Wed, Dec 11 at 2pm for lab and 2:15 for Coumadin Clinic. No refills needed at this time.

## 2012-03-18 NOTE — Patient Instructions (Addendum)
Will continue 5 mg daily and RTC in 4 weeks, Wed, Dec 11 at 2pm for lab and 2:15 for Coumadin Clinic.

## 2012-03-24 ENCOUNTER — Other Ambulatory Visit: Payer: Self-pay | Admitting: Internal Medicine

## 2012-04-07 ENCOUNTER — Other Ambulatory Visit: Payer: Self-pay | Admitting: Family Medicine

## 2012-04-13 ENCOUNTER — Other Ambulatory Visit: Payer: Self-pay | Admitting: Family Medicine

## 2012-04-14 ENCOUNTER — Ambulatory Visit: Payer: Medicare Other | Admitting: Pharmacist

## 2012-04-14 ENCOUNTER — Other Ambulatory Visit (HOSPITAL_BASED_OUTPATIENT_CLINIC_OR_DEPARTMENT_OTHER): Payer: Medicare Other | Admitting: Lab

## 2012-04-14 DIAGNOSIS — R894 Abnormal immunological findings in specimens from other organs, systems and tissues: Secondary | ICD-10-CM

## 2012-04-14 DIAGNOSIS — R760 Raised antibody titer: Secondary | ICD-10-CM

## 2012-04-14 DIAGNOSIS — I639 Cerebral infarction, unspecified: Secondary | ICD-10-CM

## 2012-04-14 DIAGNOSIS — I6350 Cerebral infarction due to unspecified occlusion or stenosis of unspecified cerebral artery: Secondary | ICD-10-CM

## 2012-04-14 DIAGNOSIS — D6859 Other primary thrombophilia: Secondary | ICD-10-CM

## 2012-04-14 LAB — PROTIME-INR

## 2012-04-14 NOTE — Progress Notes (Signed)
INR therapeutic today (2.3) on 5mg  daily. No changes.  No complaints. Continue current dose.  Recheck INR in 6 weeks.

## 2012-05-04 ENCOUNTER — Other Ambulatory Visit: Payer: Self-pay | Admitting: Internal Medicine

## 2012-05-08 ENCOUNTER — Other Ambulatory Visit: Payer: Self-pay | Admitting: Internal Medicine

## 2012-05-24 ENCOUNTER — Other Ambulatory Visit: Payer: Self-pay | Admitting: Internal Medicine

## 2012-05-24 DIAGNOSIS — Z1231 Encounter for screening mammogram for malignant neoplasm of breast: Secondary | ICD-10-CM

## 2012-05-26 ENCOUNTER — Telehealth: Payer: Self-pay | Admitting: Oncology

## 2012-05-26 NOTE — Telephone Encounter (Signed)
Talked to patient and gave him appt for lab and MD on 2/13 , r/s from 2/11 due to MD's PAL

## 2012-05-27 ENCOUNTER — Other Ambulatory Visit: Payer: Medicare Other | Admitting: Lab

## 2012-05-27 ENCOUNTER — Ambulatory Visit (HOSPITAL_BASED_OUTPATIENT_CLINIC_OR_DEPARTMENT_OTHER): Payer: Medicare Other | Admitting: Pharmacist

## 2012-05-27 DIAGNOSIS — R760 Raised antibody titer: Secondary | ICD-10-CM

## 2012-05-27 DIAGNOSIS — I6350 Cerebral infarction due to unspecified occlusion or stenosis of unspecified cerebral artery: Secondary | ICD-10-CM

## 2012-05-27 DIAGNOSIS — R894 Abnormal immunological findings in specimens from other organs, systems and tissues: Secondary | ICD-10-CM

## 2012-05-27 DIAGNOSIS — I639 Cerebral infarction, unspecified: Secondary | ICD-10-CM

## 2012-05-27 DIAGNOSIS — D6859 Other primary thrombophilia: Secondary | ICD-10-CM

## 2012-05-27 LAB — POCT INR: INR: 2.4

## 2012-05-27 LAB — PROTIME-INR

## 2012-05-27 NOTE — Patient Instructions (Signed)
Continue 5mg  daily. Recheck INR in 8 weeks. Return on 07/22/12 at 2:30 pm for lab & 2:45 pm for coumadin clinic.

## 2012-05-27 NOTE — Progress Notes (Signed)
Continue 5mg  daily. Recheck INR in 8 weeks. Return on 07/22/12 at 2:30 pm for lab & 2:45 pm for coumadin clinic. Pt has lab/MD visit on 06/17/12. We will not need to check an INR or see pt for coumadin clinic that day. Will see pt on 07/22/12 for next coumadin clinic visit.

## 2012-06-15 ENCOUNTER — Ambulatory Visit: Payer: Medicare Other | Admitting: Oncology

## 2012-06-15 ENCOUNTER — Other Ambulatory Visit: Payer: Medicare Other | Admitting: Lab

## 2012-06-17 ENCOUNTER — Telehealth: Payer: Self-pay | Admitting: *Deleted

## 2012-06-17 ENCOUNTER — Other Ambulatory Visit: Payer: Medicare Other | Admitting: Lab

## 2012-06-17 ENCOUNTER — Ambulatory Visit: Payer: Medicare Other | Admitting: Oncology

## 2012-06-17 NOTE — Telephone Encounter (Signed)
Call to f/u on appointment today. No answer or voice mail option. No other contact # in chart.

## 2012-06-17 NOTE — Telephone Encounter (Signed)
Pt's husband called after call made this am to see if pt was coming in today for appt.  He reports that he called yest to cancel & would like to reschedule.  POF to scheduler.

## 2012-06-18 ENCOUNTER — Telehealth: Payer: Self-pay | Admitting: Oncology

## 2012-06-18 NOTE — Telephone Encounter (Signed)
Called pt and left message regarding labs and ML visit on April 2014

## 2012-06-21 ENCOUNTER — Ambulatory Visit
Admission: RE | Admit: 2012-06-21 | Discharge: 2012-06-21 | Disposition: A | Discharge status: Home / Self Care | Payer: Medicare Other | Source: Ambulatory Visit | Attending: Internal Medicine | Admitting: Internal Medicine

## 2012-06-21 DIAGNOSIS — Z1231 Encounter for screening mammogram for malignant neoplasm of breast: Secondary | ICD-10-CM

## 2012-06-22 ENCOUNTER — Other Ambulatory Visit: Payer: Self-pay | Admitting: Internal Medicine

## 2012-06-24 ENCOUNTER — Other Ambulatory Visit: Payer: Self-pay | Admitting: Internal Medicine

## 2012-07-01 ENCOUNTER — Other Ambulatory Visit: Payer: Self-pay | Admitting: Internal Medicine

## 2012-07-04 ENCOUNTER — Other Ambulatory Visit: Payer: Self-pay | Admitting: Internal Medicine

## 2012-07-06 ENCOUNTER — Other Ambulatory Visit: Payer: Self-pay | Admitting: Oncology

## 2012-07-14 ENCOUNTER — Telehealth: Payer: Self-pay | Admitting: Internal Medicine

## 2012-07-14 ENCOUNTER — Encounter: Payer: Self-pay | Admitting: Internal Medicine

## 2012-07-14 ENCOUNTER — Ambulatory Visit (INDEPENDENT_AMBULATORY_CARE_PROVIDER_SITE_OTHER): Payer: Medicare Other | Admitting: Internal Medicine

## 2012-07-14 VITALS — BP 112/70 | HR 76 | Temp 98.2°F | Wt 210.0 lb

## 2012-07-14 DIAGNOSIS — E785 Hyperlipidemia, unspecified: Secondary | ICD-10-CM

## 2012-07-14 DIAGNOSIS — Z23 Encounter for immunization: Secondary | ICD-10-CM

## 2012-07-14 DIAGNOSIS — D6859 Other primary thrombophilia: Secondary | ICD-10-CM

## 2012-07-14 DIAGNOSIS — I10 Essential (primary) hypertension: Secondary | ICD-10-CM

## 2012-07-14 DIAGNOSIS — D6862 Lupus anticoagulant syndrome: Secondary | ICD-10-CM

## 2012-07-14 LAB — BASIC METABOLIC PANEL
BUN: 17 mg/dL (ref 6–23)
Chloride: 108 mEq/L (ref 96–112)
Creatinine, Ser: 0.8 mg/dL (ref 0.4–1.2)
GFR: 76.22 mL/min (ref >60.00)
Glucose, Bld: 115 mg/dL — ABNORMAL HIGH (ref 70–99)

## 2012-07-14 NOTE — Progress Notes (Signed)
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 Denies lower extremity edema, abdominal pain, change in appetite, change in bowel movements. Patient denies rashes, musculoskeletal complaints. No other specific complaints in a complete review of systems.    Well-developed well-nourished female in no acute distress. HEENT exam atraumatic, normocephalic, extraocular muscles are intact. Neck is supple. No jugular venous distention no thyromegaly. Chest clear to auscultation without increased work of breathing. Cardiac exam S1 and S2 are regular. Abdominal exam active bowel sounds, soft, nontender. Extremities no edema. Neurologic exam she is alert without any motor sensory deficits. Gait is normal.

## 2012-07-14 NOTE — Telephone Encounter (Signed)
Has not been reviewed by Dr Swords, pt aware 

## 2012-07-14 NOTE — Telephone Encounter (Signed)
Pt needs blood work results °

## 2012-07-15 ENCOUNTER — Other Ambulatory Visit: Payer: Self-pay | Admitting: *Deleted

## 2012-07-15 MED ORDER — POTASSIUM CHLORIDE CRYS ER 20 MEQ PO TBCR: 20.0000 meq | EXTENDED_RELEASE_TABLET | Freq: Two times a day (BID) | ORAL | Status: DC

## 2012-07-15 NOTE — Assessment & Plan Note (Signed)
She has regular followup with hematology.

## 2012-07-15 NOTE — Assessment & Plan Note (Signed)
Continue current medications. Check laboratory work today. 

## 2012-07-15 NOTE — Assessment & Plan Note (Signed)
BP Readings from Last 3 Encounters:  07/14/12 112/70  12/19/11 90/68  12/17/11 121/65

## 2012-07-22 ENCOUNTER — Other Ambulatory Visit: Payer: Medicare Other | Admitting: Lab

## 2012-07-22 ENCOUNTER — Ambulatory Visit (HOSPITAL_BASED_OUTPATIENT_CLINIC_OR_DEPARTMENT_OTHER): Payer: Medicare Other | Admitting: Pharmacist

## 2012-07-22 ENCOUNTER — Other Ambulatory Visit (HOSPITAL_BASED_OUTPATIENT_CLINIC_OR_DEPARTMENT_OTHER): Payer: Medicare Other | Admitting: Lab

## 2012-07-22 DIAGNOSIS — D6859 Other primary thrombophilia: Secondary | ICD-10-CM

## 2012-07-22 DIAGNOSIS — I639 Cerebral infarction, unspecified: Secondary | ICD-10-CM

## 2012-07-22 DIAGNOSIS — D6862 Lupus anticoagulant syndrome: Secondary | ICD-10-CM

## 2012-07-22 DIAGNOSIS — R760 Raised antibody titer: Secondary | ICD-10-CM

## 2012-07-22 DIAGNOSIS — I6350 Cerebral infarction due to unspecified occlusion or stenosis of unspecified cerebral artery: Secondary | ICD-10-CM

## 2012-07-22 LAB — COMPREHENSIVE METABOLIC PANEL (CC13)
Albumin: 3.5 g/dL (ref 3.5–5.0)
Alkaline Phosphatase: 105 U/L (ref 40–150)
BUN: 17.2 mg/dL (ref 7.0–26.0)
CO2: 27 mEq/L (ref 22–29)
Glucose: 129 mg/dl — ABNORMAL HIGH (ref 70–99)
Potassium: 3.9 mEq/L (ref 3.5–5.1)
Sodium: 140 mEq/L (ref 136–145)
Total Protein: 7.2 g/dL (ref 6.4–8.3)

## 2012-07-22 LAB — CBC WITH DIFFERENTIAL/PLATELET
BASO%: 1 % (ref 0.0–2.0)
EOS%: 2.3 % (ref 0.0–7.0)
LYMPH%: 33.9 % (ref 14.0–49.7)
MCH: 31.6 pg (ref 25.1–34.0)
MCHC: 34.2 g/dL (ref 31.5–36.0)
MONO#: 0.5 10*3/uL (ref 0.1–0.9)
NEUT%: 54.4 % (ref 38.4–76.8)
Platelets: 203 10*3/uL (ref 145–400)
RBC: 4.57 10*6/uL (ref 3.70–5.45)
WBC: 6 10*3/uL (ref 3.9–10.3)

## 2012-07-22 LAB — POCT INR: INR: 2.6

## 2012-07-22 LAB — PROTIME-INR: Protime: 31.2 Seconds — ABNORMAL HIGH (ref 10.6–13.4)

## 2012-07-22 NOTE — Progress Notes (Signed)
INR within goal today. Pt doing well.  No problems to report. CBC is WNL today. Continue 5mg  daily. Recheck INR in 8 weeks. Return on 09/16/12 at 2:30 pm for lab & 2:45 pm for coumadin clinic.

## 2012-07-22 NOTE — Patient Instructions (Signed)
Continue 5mg  daily. Recheck INR in 8 weeks. Return on 09/16/12 at 2:30 pm for lab & 2:45 pm for coumadin clinic.

## 2012-07-27 ENCOUNTER — Telehealth: Payer: Self-pay | Admitting: Internal Medicine

## 2012-07-27 NOTE — Telephone Encounter (Signed)
Pt states the lisinopril (PRINIVIL,ZESTRIL) 10 MG tablet is giving her a little cough, especially at night, and she has a continous tickle in her throat.  Would like to know if you could RX something  Else. Pharm: [facility]/ [place], [place]

## 2012-07-27 NOTE — Telephone Encounter (Signed)
D/c lisinopril Start losartan 50 mg po qd #90/3 , 3 refills

## 2012-07-28 ENCOUNTER — Other Ambulatory Visit: Payer: Self-pay | Admitting: *Deleted

## 2012-07-28 MED ORDER — LOSARTAN POTASSIUM 50 MG PO TABS: 50.0000 mg | ORAL_TABLET | Freq: Every day | ORAL | Status: DC

## 2012-07-28 NOTE — Telephone Encounter (Signed)
No answer, no machine.

## 2012-07-29 NOTE — Telephone Encounter (Signed)
Pt aware.

## 2012-08-04 ENCOUNTER — Other Ambulatory Visit: Payer: Self-pay | Admitting: Internal Medicine

## 2012-08-09 ENCOUNTER — Other Ambulatory Visit (HOSPITAL_BASED_OUTPATIENT_CLINIC_OR_DEPARTMENT_OTHER): Payer: Medicare Other

## 2012-08-09 ENCOUNTER — Telehealth: Payer: Self-pay | Admitting: Oncology

## 2012-08-09 ENCOUNTER — Ambulatory Visit (HOSPITAL_BASED_OUTPATIENT_CLINIC_OR_DEPARTMENT_OTHER): Payer: Medicare Other | Admitting: Nurse Practitioner

## 2012-08-09 VITALS — BP 108/60 | HR 59 | Temp 97.3°F | Resp 18 | Ht 64.0 in | Wt 213.7 lb

## 2012-08-09 DIAGNOSIS — L930 Discoid lupus erythematosus: Secondary | ICD-10-CM

## 2012-08-09 DIAGNOSIS — D6859 Other primary thrombophilia: Secondary | ICD-10-CM

## 2012-08-09 DIAGNOSIS — D6862 Lupus anticoagulant syndrome: Secondary | ICD-10-CM

## 2012-08-09 DIAGNOSIS — Z8679 Personal history of other diseases of the circulatory system: Secondary | ICD-10-CM

## 2012-08-09 DIAGNOSIS — F3289 Other specified depressive episodes: Secondary | ICD-10-CM

## 2012-08-09 DIAGNOSIS — F329 Major depressive disorder, single episode, unspecified: Secondary | ICD-10-CM

## 2012-08-09 DIAGNOSIS — I639 Cerebral infarction, unspecified: Secondary | ICD-10-CM

## 2012-08-09 DIAGNOSIS — I10 Essential (primary) hypertension: Secondary | ICD-10-CM

## 2012-08-09 NOTE — Progress Notes (Signed)
OFFICE PROGRESS NOTE  Interval history:  Amanda Turner is a 67 year old woman with a history of lupus. 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 She had a number of postoperative complications including initial CSF leak, then subsequent hemorrhagic infarct and then multifocal thrombotic infarcts in the brain. She was found to have a positive lupus type anticoagulant. She is maintained on chronic full dose Coumadin anticoagulation followed through our office.  She is seen today for scheduled followup. She feels well. She continues to note improvement in the partial expressive aphasia. She continues Coumadin. She denies bleeding. No unusual headaches. No vision change. No focal extremity weakness. She denies shortness of breath. No calf pain. She notes legs are mildly edematous.    Objective: Blood pressure 108/60, pulse 59, temperature 97.3 F (36.3 C), temperature source Oral, resp. rate 18, height 5\' 4"  (1.626 m), weight 213 lb 11.2 oz (96.934 kg).  Pupils equal round and reactive to light. Extraocular movements intact. Oropharynx is without thrush or ulceration. Lungs are clear. Regular cardiac rhythm. Abdomen is soft and nontender. No organomegaly. Trace lower leg edema bilaterally. Calves are nontender. Motor strength 5 over 5. Gait normal.  Lab Results: Lab Results  Component Value Date   WBC 6.0 07/22/2012   HGB 14.5 07/22/2012   HCT 42.3 07/22/2012   MCV 92.5 07/22/2012   PLT 203 07/22/2012    Chemistry:    Chemistry      Component Value Date/Time   NA 140 07/22/2012 1426   NA 142 07/14/2012 0843   K 3.9 07/22/2012 1426   K 3.3* 07/14/2012 0843   CL 104 07/22/2012 1426   CL 108 07/14/2012 0843   CO2 27 07/22/2012 1426   CO2 27 07/14/2012 0843   BUN 17.2 07/22/2012 1426   BUN 17 07/14/2012 0843   CREATININE 0.8 07/22/2012 1426   CREATININE 0.8 07/14/2012 0843      Component Value Date/Time   CALCIUM 8.9 07/22/2012 1426   CALCIUM 8.7 07/14/2012 0843   ALKPHOS 105 07/22/2012 1426   ALKPHOS 70 12/17/2011 1433   AST 17 07/22/2012 1426   AST 20 12/17/2011 1433   ALT 27 07/22/2012 1426   ALT 31 12/17/2011 1433   BILITOT 0.50 07/22/2012 1426   BILITOT 0.5 12/17/2011 1433       Studies/Results: No results found.  Medications: I have reviewed the patient's current medications.  Assessment/Plan:  1. 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 She continues full dose Coumadin anticoagulation and aspirin. 2. 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 3. Lupus erythematosus. 4. Depression. 5. Hyperlipidemia. 6. Hypertension.  Disposition-she will continue Coumadin anticoagulation and aspirin. She is followed through the Coumadin clinic in our office. We will see her in followup in 6 months. She will contact the office in the interim with any problems.  Lonna Cobb ANP/GNP-BC   CC Dr. Marlowe Shores and Dr. Birdie Sons.

## 2012-08-13 ENCOUNTER — Encounter: Payer: Self-pay | Admitting: Internal Medicine

## 2012-08-19 ENCOUNTER — Other Ambulatory Visit: Payer: Self-pay | Admitting: Internal Medicine

## 2012-08-24 ENCOUNTER — Encounter: Payer: Self-pay | Admitting: Internal Medicine

## 2012-08-26 ENCOUNTER — Encounter: Payer: Self-pay | Admitting: Internal Medicine

## 2012-08-26 MED ORDER — AMLODIPINE BESYLATE 5 MG PO TABS: 5.0000 mg | ORAL_TABLET | Freq: Every day | ORAL | Status: DC

## 2012-09-01 ENCOUNTER — Ambulatory Visit (INDEPENDENT_AMBULATORY_CARE_PROVIDER_SITE_OTHER): Payer: Medicare Other | Admitting: Family Medicine

## 2012-09-01 ENCOUNTER — Encounter: Payer: Self-pay | Admitting: Family Medicine

## 2012-09-01 VITALS — BP 122/70 | HR 61 | Temp 97.7°F | Resp 18 | Wt 215.0 lb

## 2012-09-01 DIAGNOSIS — I10 Essential (primary) hypertension: Secondary | ICD-10-CM

## 2012-09-01 MED ORDER — FUROSEMIDE 40 MG PO TABS: 40.0000 mg | ORAL_TABLET | Freq: Every day | ORAL | Status: DC

## 2012-09-01 MED ORDER — METOPROLOL SUCCINATE ER 25 MG PO TB24: 25.0000 mg | ORAL_TABLET | Freq: Every day | ORAL | Status: DC

## 2012-09-01 NOTE — Progress Notes (Signed)
  Subjective:    Patient ID: Amanda Turner, female    DOB: 1945/12/30, 67 y.o.   MRN: [id number]  HPI Here for possible reactions to medications. She has been trying several HTN meds with Dr. Cato Mulligan over the past few months. She was on Lisinopril at first but stopped this due to a cough. On 07-14-12 this was changed to Losartan, and the cough went away. However she then developed swelling around the face and tingling in the top of her scalp, so on 08-24-12 this was changed to Amlodipine. Today the facial swelling is still present and she has developed mild swelling in the feet and ankles. Her BP is stable. She denies any SOB. She has been taking 20 mg a day of Lasix all along. She and her husband are convinced she is reacting to the Amlodipine as well.    Review of Systems  Constitutional: Negative.   Respiratory: Negative.   Cardiovascular: Positive for leg swelling. Negative for chest pain and palpitations.  Neurological: Negative.        Objective:   Physical Exam  Constitutional: She appears well-developed and well-nourished. No distress.  HENT:  Very slight edema of the right cheek, no erythema or warmth  Neck: No thyromegaly present.  Cardiovascular: Normal rate, regular rhythm, normal heart sounds and intact distal pulses.   Pulmonary/Chest: Effort normal and breath sounds normal.  Musculoskeletal:  2+ edema in both ankles  Lymphadenopathy:    She has no cervical adenopathy.          Assessment & Plan:  Possible reactions to several BP meds. We will stop the Amlodipine immediately. Start on Metoprolol succinate 25 mg daily, and increase the Lasix to 40 mg daily. Recheck with Dr. Cato Mulligan in 6 weeks

## 2012-09-06 ENCOUNTER — Ambulatory Visit (INDEPENDENT_AMBULATORY_CARE_PROVIDER_SITE_OTHER): Payer: Medicare Other | Admitting: Nurse Practitioner

## 2012-09-06 ENCOUNTER — Other Ambulatory Visit: Payer: Medicare Other | Admitting: Lab

## 2012-09-06 ENCOUNTER — Encounter: Payer: Self-pay | Admitting: Nurse Practitioner

## 2012-09-06 ENCOUNTER — Encounter: Payer: Self-pay | Admitting: Internal Medicine

## 2012-09-06 VITALS — BP 112/64 | HR 57 | Ht 64.0 in | Wt 215.0 lb

## 2012-09-06 DIAGNOSIS — I6529 Occlusion and stenosis of unspecified carotid artery: Secondary | ICD-10-CM | POA: Insufficient documentation

## 2012-09-06 DIAGNOSIS — I63239 Cerebral infarction due to unspecified occlusion or stenosis of unspecified carotid arteries: Secondary | ICD-10-CM

## 2012-09-06 NOTE — Patient Instructions (Addendum)
Will check transcranial and carotid Dopplers Continue Coumadin as directed Blood pressure goal below 130 LDL below 100 Healthy diet and exercise Use cane if ambulation is not steady Followup in 6 months

## 2012-09-06 NOTE — Progress Notes (Signed)
HPI: Patient returns for followup after last visit 07/08/2011. 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 Last Doppler studies were in November of 2012. She is walking some but not exercising regularly, blood pressures have been good. She follows with Dr. Venetia Maxon for MRI of the brain surveillance. Denies  double vision, loss of vision, focal weakness,  speech or swallowing problems, confusions, blackouts.    ROS:  fatigue, blurred vision, aching muscles, numbness, weakness, decreased energy, joint pain  Physical Exam General: well developed, well nourished, seated, in no evident distress Head: head normocephalic and atraumatic. Oropharynx benign Neck: supple with no carotid or supraclavicular bruits Cardiovascular: regular rate and rhythm, no murmurs  Neurologic Exam Mental Status: Awake and fully alert. Oriented to place and time. Recent and remote memory intact. Attention span, concentration and fund of knowledge appropriate. Mood and affect appropriate.  Cranial Nerves: Pupils equal, briskly reactive to light. Extraocular movements full without nystagmus. Visual fields full to confrontation. Hearing intact and symmetric to finger snap. Facial sensation intact. Face, tongue, palate move normally and symmetrically. Neck flexion and extension normal.  Motor: Normal bulk and tone except mild increased tone right leg. Normal strength in all tested extremity muscles . Sensory.: intact to touch and pinprick and vibratory.  Coordination: Rapid alternating movements impaired on the right  Gait and Station: Arises from chair without difficulty. Stance is normal. Able to heel, toe and  unsteady with tandem walk.No assistive device Reflexes: 2+ and  symmetric. Toes downgoing.     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 Vascular risk factors of hypertension, hyperlipidemia and obesity     PLAN: Will check transcranial and carotid Dopplers Continue Coumadin as directed Blood pressure goal below 130 LDL below 100 Healthy diet and exercise Use cane if ambulation is not steady Followup in 6 months       Nilda Riggs, GNP-BC APRN

## 2012-09-08 ENCOUNTER — Encounter: Payer: Self-pay | Admitting: Nurse Practitioner

## 2012-09-09 ENCOUNTER — Encounter: Payer: Self-pay | Admitting: Internal Medicine

## 2012-09-10 ENCOUNTER — Telehealth: Payer: Self-pay | Admitting: Radiology

## 2012-09-10 NOTE — Telephone Encounter (Signed)
Sent message via paper to Kaylyn Lim.

## 2012-09-15 ENCOUNTER — Other Ambulatory Visit: Payer: Self-pay | Admitting: *Deleted

## 2012-09-15 DIAGNOSIS — D6862 Lupus anticoagulant syndrome: Secondary | ICD-10-CM

## 2012-09-16 ENCOUNTER — Ambulatory Visit: Payer: Medicare Other | Admitting: Pharmacist

## 2012-09-16 ENCOUNTER — Other Ambulatory Visit (HOSPITAL_BASED_OUTPATIENT_CLINIC_OR_DEPARTMENT_OTHER): Payer: Medicare Other | Admitting: Lab

## 2012-09-16 DIAGNOSIS — D6859 Other primary thrombophilia: Secondary | ICD-10-CM

## 2012-09-16 DIAGNOSIS — I639 Cerebral infarction, unspecified: Secondary | ICD-10-CM

## 2012-09-16 DIAGNOSIS — D6862 Lupus anticoagulant syndrome: Secondary | ICD-10-CM

## 2012-09-16 LAB — POCT INR: INR: 3.1

## 2012-09-16 LAB — PROTIME-INR: INR: 3.1 (ref 2.00–3.50)

## 2012-09-16 NOTE — Progress Notes (Signed)
INR = 3.1 on Coumadin 5 mg/day Med changes: off Celexa completely for ~1 week (could cause INR to decrease after stopped), increased Lasix & added Toprol XL No complaints re: anticoag. INR a little above goal but pt has been maintained on current dose for quite a while. No change to Coumadin. Recheck INR in 1 month. Ebony Hail, Pharm.D., CPP 09/16/2012@3 :18 PM

## 2012-09-18 ENCOUNTER — Encounter: Payer: Self-pay | Admitting: Family Medicine

## 2012-09-20 ENCOUNTER — Other Ambulatory Visit: Payer: Self-pay | Admitting: Neurology

## 2012-09-20 ENCOUNTER — Encounter: Payer: Self-pay | Admitting: Nurse Practitioner

## 2012-09-20 ENCOUNTER — Encounter: Payer: Self-pay | Admitting: Internal Medicine

## 2012-09-21 ENCOUNTER — Ambulatory Visit (INDEPENDENT_AMBULATORY_CARE_PROVIDER_SITE_OTHER): Payer: Medicare Other

## 2012-09-21 ENCOUNTER — Encounter: Payer: Self-pay | Admitting: Internal Medicine

## 2012-09-21 DIAGNOSIS — I6350 Cerebral infarction due to unspecified occlusion or stenosis of unspecified cerebral artery: Secondary | ICD-10-CM

## 2012-09-21 DIAGNOSIS — I63239 Cerebral infarction due to unspecified occlusion or stenosis of unspecified carotid arteries: Secondary | ICD-10-CM

## 2012-09-21 DIAGNOSIS — Z0289 Encounter for other administrative examinations: Secondary | ICD-10-CM

## 2012-09-22 ENCOUNTER — Ambulatory Visit (INDEPENDENT_AMBULATORY_CARE_PROVIDER_SITE_OTHER): Payer: Medicare Other | Admitting: Family Medicine

## 2012-09-22 ENCOUNTER — Encounter: Payer: Self-pay | Admitting: Family Medicine

## 2012-09-22 VITALS — BP 138/80 | HR 71 | Temp 98.2°F | Wt 208.0 lb

## 2012-09-22 DIAGNOSIS — F411 Generalized anxiety disorder: Secondary | ICD-10-CM

## 2012-09-22 DIAGNOSIS — I10 Essential (primary) hypertension: Secondary | ICD-10-CM

## 2012-09-22 DIAGNOSIS — M26609 Unspecified temporomandibular joint disorder, unspecified side: Secondary | ICD-10-CM

## 2012-09-22 MED ORDER — CITALOPRAM HYDROBROMIDE 10 MG PO TABS: 10.0000 mg | ORAL_TABLET | Freq: Every day | ORAL | Status: DC

## 2012-09-22 MED ORDER — LISINOPRIL 5 MG PO TABS: 5.0000 mg | ORAL_TABLET | Freq: Every day | ORAL | Status: DC

## 2012-09-22 NOTE — Progress Notes (Signed)
  Subjective:    Patient ID: Amanda Turner, female    DOB: 01/04/1946, 67 y.o.   MRN: [id number]  HPI Here for several issues. First she has had 4 days of pain in the right ear and the right side of her head. No cold or sinus symptoms. Also she has been on Metoprolol but says she feels flushed a lot, even her BP is stable. She wants to go back on Lisinopril but on a lower dose than before. 10 mg a day of Lisiniorpil caused a cough. Lastly she was tapered off celexa by Dr. Cato Mulligan but wants to go back on a low dose of this. Her anxiety makes her moody and irritable.    Review of Systems  Constitutional: Negative.   Respiratory: Negative.   Cardiovascular: Negative.        Objective:   Physical Exam  Constitutional: She appears well-developed and well-nourished.  HENT:  Right Ear: External ear normal.  Left Ear: External ear normal.  Nose: Nose normal.  Mouth/Throat: Oropharynx is clear and moist.  Tender over the right TMJ   Eyes: Conjunctivae are normal.  Cardiovascular: Normal rate, regular rhythm, normal heart sounds and intact distal pulses.   Pulmonary/Chest: Effort normal and breath sounds normal.  Lymphadenopathy:    She has no cervical adenopathy.  Psychiatric: She has a normal mood and affect. Her behavior is normal. Thought content normal.          Assessment & Plan:  Stop the Metoprolol and start on Lisinopril 5 mg daily. Get back on Celexa 10 mg daily. 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 Advised her to see her dentist soon

## 2012-09-23 ENCOUNTER — Encounter: Payer: Self-pay | Admitting: Nurse Practitioner

## 2012-09-23 ENCOUNTER — Encounter: Payer: Self-pay | Admitting: Family Medicine

## 2012-09-28 ENCOUNTER — Encounter: Payer: Self-pay | Admitting: Nurse Practitioner

## 2012-09-29 ENCOUNTER — Telehealth: Payer: Self-pay | Admitting: *Deleted

## 2012-09-29 NOTE — Telephone Encounter (Signed)
Results have not been read we will call him.

## 2012-09-29 NOTE — Telephone Encounter (Signed)
Patient calling wanting Doppler result. Patient also left a message in [place].

## 2012-09-30 NOTE — Telephone Encounter (Signed)
Replied to patient in mychart.

## 2012-10-06 ENCOUNTER — Encounter: Payer: Self-pay | Admitting: Family Medicine

## 2012-10-12 ENCOUNTER — Encounter: Payer: Self-pay | Admitting: Nurse Practitioner

## 2012-10-12 ENCOUNTER — Telehealth: Payer: Self-pay | Admitting: Nurse Practitioner

## 2012-10-12 NOTE — Telephone Encounter (Signed)
Please let patient know that his carotid doppler and TCD are unchanged from previous.

## 2012-10-12 NOTE — Telephone Encounter (Signed)
Called and left message that doppler and tcd are unchanged from previous. If any questions please call back.

## 2012-10-13 ENCOUNTER — Encounter: Payer: Self-pay | Admitting: Nurse Practitioner

## 2012-10-14 ENCOUNTER — Other Ambulatory Visit: Payer: Self-pay | Admitting: Pharmacist

## 2012-10-14 ENCOUNTER — Ambulatory Visit (HOSPITAL_BASED_OUTPATIENT_CLINIC_OR_DEPARTMENT_OTHER): Payer: Medicare Other | Admitting: Pharmacist

## 2012-10-14 ENCOUNTER — Other Ambulatory Visit: Payer: Medicare Other | Admitting: Lab

## 2012-10-14 DIAGNOSIS — D6859 Other primary thrombophilia: Secondary | ICD-10-CM

## 2012-10-14 DIAGNOSIS — D6862 Lupus anticoagulant syndrome: Secondary | ICD-10-CM

## 2012-10-14 DIAGNOSIS — Z8679 Personal history of other diseases of the circulatory system: Secondary | ICD-10-CM

## 2012-10-14 DIAGNOSIS — I639 Cerebral infarction, unspecified: Secondary | ICD-10-CM

## 2012-10-14 DIAGNOSIS — I6350 Cerebral infarction due to unspecified occlusion or stenosis of unspecified cerebral artery: Secondary | ICD-10-CM

## 2012-10-14 LAB — PROTIME-INR

## 2012-10-14 LAB — POCT INR: INR: 2.7

## 2012-10-14 NOTE — Progress Notes (Signed)
INR within goal today. Pt has discontinued Toprol and is now on Lisinopril 5mg  daily. Pt doing well. No complaints regarding anticoagulation. Continue 5mg  daily. Recheck INR in 6 weeks. Return on 11/25/12 at 2:30 pm for lab & 2:45 pm for coumadin clinic.

## 2012-10-14 NOTE — Patient Instructions (Signed)
Continue 5mg  daily. Recheck INR in 6 weeks. Return on 11/25/12 at 2:30 pm for lab & 2:45 pm for coumadin clinic.

## 2012-10-22 ENCOUNTER — Encounter: Payer: Self-pay | Admitting: Internal Medicine

## 2012-10-22 ENCOUNTER — Encounter: Payer: Self-pay | Admitting: Nurse Practitioner

## 2012-10-23 ENCOUNTER — Other Ambulatory Visit: Payer: Self-pay | Admitting: Internal Medicine

## 2012-10-30 ENCOUNTER — Other Ambulatory Visit: Payer: Self-pay | Admitting: Oncology

## 2012-11-10 ENCOUNTER — Encounter: Payer: Self-pay | Admitting: Internal Medicine

## 2012-11-21 ENCOUNTER — Other Ambulatory Visit: Payer: Self-pay | Admitting: Internal Medicine

## 2012-11-25 ENCOUNTER — Other Ambulatory Visit (HOSPITAL_BASED_OUTPATIENT_CLINIC_OR_DEPARTMENT_OTHER): Payer: Medicare Other | Admitting: Lab

## 2012-11-25 ENCOUNTER — Ambulatory Visit (HOSPITAL_BASED_OUTPATIENT_CLINIC_OR_DEPARTMENT_OTHER): Payer: Medicare Other | Admitting: Pharmacist

## 2012-11-25 DIAGNOSIS — I639 Cerebral infarction, unspecified: Secondary | ICD-10-CM

## 2012-11-25 DIAGNOSIS — I6350 Cerebral infarction due to unspecified occlusion or stenosis of unspecified cerebral artery: Secondary | ICD-10-CM

## 2012-11-25 DIAGNOSIS — D6859 Other primary thrombophilia: Secondary | ICD-10-CM

## 2012-11-25 LAB — PROTIME-INR

## 2012-11-25 LAB — POCT INR: INR: 2.5

## 2012-11-25 NOTE — Progress Notes (Signed)
Patient's INR is within goal today at 2.5 (goal 2-3) on warfarin 5 mg daily. She has no complaints and is doing well.   We will recheck her INR on 02/08/13 when she comes back for her appointment with Dr. Cyndie Chime.  She will go to lab at 2:45 pm, Coumadin Clinic at 3:00 pm, and see Dr. Cyndie Chime at 3:15 pm.   Lillia Pauls, PharmD Clinical Pharmacist 11/25/2012 3:02 PM

## 2012-12-08 ENCOUNTER — Encounter: Payer: Self-pay | Admitting: Internal Medicine

## 2012-12-21 ENCOUNTER — Encounter: Payer: Self-pay | Admitting: Family Medicine

## 2012-12-23 MED ORDER — CITALOPRAM HYDROBROMIDE 20 MG PO TABS: 20.0000 mg | ORAL_TABLET | Freq: Every day | ORAL | Status: DC

## 2012-12-31 ENCOUNTER — Telehealth: Payer: Self-pay | Admitting: Internal Medicine

## 2012-12-31 NOTE — Telephone Encounter (Signed)
PT has emailed and requested that she be seen on 01/24/13, at the same time as her husband Neziah Vogelgesang. Please advise.

## 2013-01-03 NOTE — Telephone Encounter (Signed)
Have her see padonda

## 2013-01-04 ENCOUNTER — Telehealth: Payer: Self-pay | Admitting: Internal Medicine

## 2013-01-04 NOTE — Telephone Encounter (Signed)
Pt requesting to be worked in for cpx on 01/24/13, which is the same day her husband is coming in to see Dr. Cato Mulligan.  She is currently scheduled to come in for cpx on 04/01/13 but prefers sooner cpx appt.  Please advise if willing to work patient in 01/24/13.

## 2013-01-24 ENCOUNTER — Encounter: Payer: Self-pay | Admitting: Family

## 2013-01-24 ENCOUNTER — Ambulatory Visit (INDEPENDENT_AMBULATORY_CARE_PROVIDER_SITE_OTHER): Payer: Medicare Other | Admitting: Family

## 2013-01-24 ENCOUNTER — Encounter: Payer: Self-pay | Admitting: Nurse Practitioner

## 2013-01-24 VITALS — BP 112/80 | HR 64 | Ht 64.0 in | Wt 210.0 lb

## 2013-01-24 DIAGNOSIS — Z0000 Encounter for general adult medical examination without abnormal findings: Secondary | ICD-10-CM

## 2013-01-24 DIAGNOSIS — F411 Generalized anxiety disorder: Secondary | ICD-10-CM

## 2013-01-24 DIAGNOSIS — I10 Essential (primary) hypertension: Secondary | ICD-10-CM

## 2013-01-24 DIAGNOSIS — Z23 Encounter for immunization: Secondary | ICD-10-CM

## 2013-01-24 LAB — LIPID PANEL
Cholesterol: 173 mg/dL (ref 0–200)
HDL: 59.3 mg/dL (ref >39.00)
LDL Cholesterol: 84 mg/dL (ref 0–99)
Total CHOL/HDL Ratio: 3
Triglycerides: 149 mg/dL (ref 0.0–149.0)

## 2013-01-24 LAB — HEPATIC FUNCTION PANEL
Albumin: 4 g/dL (ref 3.5–5.2)
Bilirubin, Direct: 0 mg/dL (ref 0.0–0.3)
Total Bilirubin: 0.6 mg/dL (ref 0.3–1.2)

## 2013-01-24 LAB — POCT URINALYSIS DIPSTICK
Bilirubin, UA: NEGATIVE
Glucose, UA: NEGATIVE
Nitrite, UA: NEGATIVE
Spec Grav, UA: 1.015

## 2013-01-24 LAB — CBC WITH DIFFERENTIAL/PLATELET
Basophils Relative: 0.9 % (ref 0.0–3.0)
Eosinophils Relative: 2.5 % (ref 0.0–5.0)
HCT: 44.6 % (ref 36.0–46.0)
Hemoglobin: 15.2 g/dL — ABNORMAL HIGH (ref 12.0–15.0)
Lymphs Abs: 2.1 10*3/uL (ref 0.7–4.0)
Monocytes Relative: 9.7 % (ref 3.0–12.0)
Neutro Abs: 3.7 10*3/uL (ref 1.4–7.7)
RBC: 4.86 Mil/uL (ref 3.87–5.11)
WBC: 6.6 10*3/uL (ref 4.5–10.5)

## 2013-01-24 LAB — BASIC METABOLIC PANEL
CO2: 26 mEq/L (ref 19–32)
Chloride: 105 mEq/L (ref 96–112)
Creatinine, Ser: 0.8 mg/dL (ref 0.4–1.2)
Potassium: 3.3 mEq/L — ABNORMAL LOW (ref 3.5–5.1)

## 2013-01-24 NOTE — Patient Instructions (Addendum)
Exercise to Stay Healthy Exercise helps you become and stay healthy. EXERCISE IDEAS AND TIPS Choose exercises that:  You enjoy.  Fit into your day. You do not need to exercise really hard to be healthy. You can do exercises at a slow or medium level and stay healthy. You can:  Stretch before and after working out.  Try yoga, Pilates, or tai chi.  Lift weights.  Walk fast, swim, jog, run, climb stairs, bicycle, dance, or rollerskate.  Take aerobic classes. Exercises that burn about 150 calories:  Running 1  miles in 15 minutes.  Playing volleyball for 45 to 60 minutes.  Washing and waxing a car for 45 to 60 minutes.  Playing touch football for 45 minutes.  Walking 1  miles in 35 minutes.  Pushing a stroller 1  miles in 30 minutes.  Playing basketball for 30 minutes.  Raking leaves for 30 minutes.  Bicycling 5 miles in 30 minutes.  Walking 2 miles in 30 minutes.  Dancing for 30 minutes.  Shoveling snow for 15 minutes.  Swimming laps for 20 minutes.  Walking up stairs for 15 minutes.  Bicycling 4 miles in 15 minutes.  Gardening for 30 to 45 minutes.  Jumping rope for 15 minutes.  Washing windows or floors for 45 to 60 minutes. Document Released: 05/24/2010 Document Revised: 07/14/2011 Document Reviewed: 05/24/2010 [employer] Patient Information 2014 [employer].  

## 2013-01-24 NOTE — Progress Notes (Signed)
Subjective:    Patient ID: Amanda Turner, female    DOB: February 22, 1946, 67 y.o.   MRN: [id number]  HPI  67 year old female, patient of Dr. Cato Mulligan is in today for a routine physical examination for this healthy  Female. Reviewed all health maintenance protocols including mammography colonoscopy bone density and reviewed appropriate screening labs. Her immunization history was reviewed as well as her current medications and allergies refills of her chronic medications were given and the plan for yearly health maintenance was discussed all orders and referrals were made as appropriate.  Review of Systems  Constitutional: Negative.   HENT: Negative.   Eyes: Negative.   Respiratory: Negative.   Cardiovascular: Negative.   Gastrointestinal: Negative.   Endocrine: Negative.   Genitourinary: Negative.   Musculoskeletal: Negative.   Skin: Negative.   Allergic/Immunologic: Negative.   Neurological: Negative.   Hematological: Negative.   Psychiatric/Behavioral: Negative.    Past Medical History  Diagnosis Date  . Depression   . Hyperlipemia   . Hypertension   . Lupus   . Elevated LFT's   . HA (headache)   . Lupus anticoagulant disorder   . Stroke   . 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 Anxiety   . Intracranial hemorrhage, spontaneous intraparenchymal, associated with coagulopathy, remote, resolved 06/23/2011  . Lupus anticoagulant with hypercoagulable state 06/23/2011  . Lupus erythematosus 06/23/2011    History   Social History  . Marital Status: Married    Spouse Name: N/A    Number of Children: 2  . Years of Education: N/A   Occupational History  .     Social History Main Topics  . Smoking status: Never Smoker   . Smokeless tobacco: Never Used  . Alcohol Use: No  . Drug Use: No  . Sexual Activity: Not on file   Other Topics Concern  . Not on file   Social History Narrative   2 cups of coffee daily    Past Surgical History   Procedure Laterality Date  . Abdominal hysterectomy    . Transphenoidal pituitary resection      Family History  Problem Relation Age of Onset  . Diabetes Brother   . Diabetes Sister   . Hypertension Mother   . Colon cancer Neg Hx     Allergies  Allergen Reactions  . Lisinopril Cough  . Losartan Swelling  . Peanut Oil     REACTION: hives  . Shrimp Flavor     hives  . Sulfonamide Derivatives     REACTION: shortness of breath    Current Outpatient Prescriptions on File Prior to Visit  Medication Sig Dispense Refill  . ALPRAZolam (XANAX) 0.5 MG tablet TAKE 1 TABLET BY MOUTH EVERY DAY  30 tablet  3  . aspirin 81 MG tablet Take 81 mg by mouth daily.        . bifidobacterium infantis (ALIGN) capsule Take 1 capsule by mouth daily.  7 capsule  0  . citalopram (CELEXA) 20 MG tablet Take 1 tablet (20 mg total) by mouth daily.  90 tablet  3  . furosemide (LASIX) 40 MG tablet Take 1 tablet (40 mg total) by mouth daily.  30 tablet  5  . lisinopril (PRINIVIL,ZESTRIL) 5 MG tablet Take 1 tablet (5 mg total) by mouth daily.  30 tablet  5  . potassium chloride SA (K-DUR,KLOR-CON) 20 MEQ tablet Take 1 tablet (20 mEq total) by mouth 2 (two) times daily.  180 tablet  3  . simvastatin (ZOCOR) 20 MG tablet TAKE 1 TABLET BY MOUTH AT BEDTIME  90 tablet  3  . warfarin (COUMADIN) 5 MG tablet TAKE 1 TABLET BY MOUTH EVERY DAY  30 tablet  3   No current facility-administered medications on file prior to visit.    BP 112/80  Pulse 64  Ht 5\' 4"  (1.626 m)  Wt 210 lb (95.255 kg)  BMI 36.03 kg/m2chart    Objective:   Physical Exam  Constitutional: She is oriented to person, place, and time. She appears well-developed and well-nourished.  HENT:  Head: Normocephalic.  Right Ear: External ear normal.  Left Ear: External ear normal.  Nose: Nose normal.  Mouth/Throat: Oropharynx is clear and moist.  Eyes: Conjunctivae are normal. Pupils are equal, round, and reactive to light.  Neck: Normal range  of motion. Neck supple. No thyromegaly present.  Cardiovascular: Normal rate, regular rhythm and normal heart sounds.   Pulmonary/Chest: Effort normal and breath sounds normal.  Abdominal: Soft. Bowel sounds are normal. She exhibits no distension. There is no tenderness. There is no rebound.  Neurological: She is alert and oriented to person, place, and time. She has normal reflexes. She displays normal reflexes. No cranial nerve deficit. Coordination normal.  Skin: Skin is warm and dry.  Psychiatric: She has a normal mood and affect.          Assessment & Plan:  Assessment: 1. Complete physical exam 2. Anxiety 2. Lupus  Plan: Continue current medications. Labs sent to include CBC, BMP, lipid, LFTs, TSH and UA will notify patient and results. Encouraged healthy diet, exercise, monthly self breast exams. Followup in 6 months and sooner as needed.

## 2013-01-26 ENCOUNTER — Telehealth: Payer: Self-pay | Admitting: Internal Medicine

## 2013-01-26 NOTE — Telephone Encounter (Signed)
Pt states that she reviewed her new mychart information, but she would like to discuss lab results with a nurse. Please assist.

## 2013-01-27 ENCOUNTER — Telehealth: Payer: Self-pay

## 2013-01-27 NOTE — Telephone Encounter (Signed)
See result note.  

## 2013-01-27 NOTE — Telephone Encounter (Signed)
Patient contacted [place] through mychart. I praised her for keeping up on her health care status and for contacting us for information. Patinet wanted to know what Occlusion and stenosis of carotid artery with cerebral infarction referred to. I asked her if she ever saw any coommercials about build-up in the arteries. She said she did. I explained how, through living, diet, genetics, the arteries can get build-up that doesn't allow blood to flow well. This can result in strokes, heart attacks or other medical conditions. This is treated with blood thinners like aspirin. The patient said she is on coumadin. I assured her that, yes, this is a blood thinner. Patient asked if she needed surgery. I let her know that if it was so severe that the doctor felt she needed surgery, they would have called her right away and made the appropriate referral. This is also why she needs to take her mediactions as ordered, eat healthy foods and exercise as appropriate and keep all appointments. Patient was grateful for the information.

## 2013-01-28 ENCOUNTER — Telehealth: Payer: Self-pay | Admitting: Internal Medicine

## 2013-01-28 ENCOUNTER — Ambulatory Visit (INDEPENDENT_AMBULATORY_CARE_PROVIDER_SITE_OTHER): Payer: Medicare Other | Admitting: Family Medicine

## 2013-01-28 ENCOUNTER — Encounter: Payer: Self-pay | Admitting: Family Medicine

## 2013-01-28 VITALS — BP 118/70 | HR 70 | Temp 98.8°F | Wt 210.0 lb

## 2013-01-28 DIAGNOSIS — H6692 Otitis media, unspecified, left ear: Secondary | ICD-10-CM

## 2013-01-28 DIAGNOSIS — H6690 Otitis media, unspecified, unspecified ear: Secondary | ICD-10-CM

## 2013-01-28 MED ORDER — AMOXICILLIN 875 MG PO TABS: 875.0000 mg | ORAL_TABLET | Freq: Two times a day (BID) | ORAL | Status: AC

## 2013-01-28 MED ORDER — CIPROFLOXACIN-HYDROCORTISONE 0.2-1 % OT SUSP: 3.0000 [drp] | Freq: Two times a day (BID) | OTIC | Status: DC

## 2013-01-28 NOTE — Patient Instructions (Addendum)
Otitis Media with Effusion Otitis media with effusion is the presence of fluid in the middle ear. This is a common problem that often follows ear infections. It may be present for weeks or longer after the infection. Unlike an acute ear infection, otits media with effusion refers only to fluid behind the ear drum and not infection. Children with repeated ear and sinus infections and allergy problems are the most likely to get otitis media with effusion. CAUSES  The most frequent cause of the fluid buildup is dysfunction of the eustacian tubes. These are the tubes that drain fluid in the ears to the throat. SYMPTOMS   The main symptom of this condition is hearing loss. As a result, you or your child may:  Listen to the TV at a loud volume.  Not respond to questions.  Ask "what" often when spoken to.  There may be a sensation of fullness or pressure but usually not pain. DIAGNOSIS   Your caregiver will diagnose this condition by examining you or your child's ears.  Your caregiver may test the pressure in you or your child's ear with a tympanometer.  A hearing test may be conducted if the problem persists.  A caregiver will want to re-evaluate the condition periodically to see if it improves. TREATMENT   Treatment depends on the duration and the effects of the effusion.  Antibiotics, decongestants, nose drops, and cortisone-type drugs may not be helpful.  Children with persistent ear effusions may have delayed language. Children at risk for developmental delays in hearing, learning, and speech may require referral to a specialist earlier than children not at risk.  You or your child's caregiver may suggest a referral to an Ear, Nose, and Throat (ENT) surgeon for treatment. The following may help restore normal hearing:  Drainage of fluid.  Placement of ear tubes (tympanostomy tubes).  Removal of adenoids (adenoidectomy). HOME CARE INSTRUCTIONS   Avoid second hand  smoke.  Infants who are breast fed are less likely to have this condition.  Avoid feeding infants while laying flat.  Avoid known environmental allergens.  Be sure to see a caregiver or an ENT specialist for follow up.  Avoid people who are sick. SEEK MEDICAL CARE IF:   Hearing is not better in 3 months.  Hearing is worse.  Ear pain.  Drainage from the ear.  Dizziness. Document Released: 05/29/2004 Document Revised: 07/14/2011 Document Reviewed: 09/11/2009 [employer] Patient Information 2014 [employer].  

## 2013-01-28 NOTE — Telephone Encounter (Signed)
Pt wanted EKG results, Padonda did EKG but she is out of the office.  Per Dr Caryl Never EKG looks normal, pt aware

## 2013-01-28 NOTE — Telephone Encounter (Signed)
Pt would like to have the cough med that she decline earlier today call [facility]

## 2013-01-28 NOTE — Progress Notes (Signed)
  Subjective:    Patient ID: Amanda Turner, female    DOB: 05-02-1946, 67 y.o.   MRN: [id number]  HPI Patient is seen with onset 2 days of body aches, left ear ache, and sore throat. She's had occasional cough. No fever. She also noted some drainage on her pillow from her left ear. She's noticed a little decreased hearing in her left ear past couple days. No vertigo.  Past Medical History  Diagnosis Date  . Depression   . Hyperlipemia   . Hypertension   . Lupus   . Elevated LFT's   . HA (headache)   . Lupus anticoagulant disorder   . Stroke   . 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 Anxiety   . Intracranial hemorrhage, spontaneous intraparenchymal, associated with coagulopathy, remote, resolved 06/23/2011  . Lupus anticoagulant with hypercoagulable state 06/23/2011  . Lupus erythematosus 06/23/2011   Past Surgical History  Procedure Laterality Date  . Abdominal hysterectomy    . Transphenoidal pituitary resection      reports that she has never smoked. She has never used smokeless tobacco. She reports that she does not drink alcohol or use illicit drugs. family history includes Diabetes in her brother and sister; Hypertension in her mother. There is no history of Colon cancer. Allergies  Allergen Reactions  . Lisinopril Cough  . Losartan Swelling  . Peanut Oil     REACTION: hives  . Shrimp Flavor     hives  . Sulfonamide Derivatives     REACTION: shortness of breath      Review of Systems  Constitutional: Negative for fever and chills.  HENT: Positive for ear pain, sore throat and ear discharge.   Respiratory: Positive for cough.        Objective:   Physical Exam  Constitutional: She appears well-developed and well-nourished.  HENT:  Mouth/Throat: Oropharynx is clear and moist.  Right eardrum is normal. Left canal reveals some debris. After removed with curette, she has erythema ear canal and portion of eardrum visualized is  erythematous and distorted  Cardiovascular: Normal rate.   Pulmonary/Chest: Effort normal and breath sounds normal. No respiratory distress. She has no wheezes. She has no rales.          Assessment & Plan:  Acute left otitis media. Amoxicillin 875 mg twice daily for 10 days. Cipro HC otic drops 3 drops left ear canal twice a day

## 2013-01-30 NOTE — Telephone Encounter (Signed)
Ok to call in [place] one tsp po q 6 hours prn cough, disp. 120 ml with no refill, if she still has cough.

## 2013-01-31 MED ORDER — HYDROCODONE-HOMATROPINE 5-1.5 MG/5ML PO SYRP: ORAL_SOLUTION | ORAL | Status: DC

## 2013-01-31 NOTE — Telephone Encounter (Signed)
[facility]

## 2013-02-08 ENCOUNTER — Ambulatory Visit (HOSPITAL_BASED_OUTPATIENT_CLINIC_OR_DEPARTMENT_OTHER): Payer: Medicare Other | Admitting: Oncology

## 2013-02-08 ENCOUNTER — Ambulatory Visit (HOSPITAL_BASED_OUTPATIENT_CLINIC_OR_DEPARTMENT_OTHER): Payer: Medicare Other | Admitting: Pharmacist

## 2013-02-08 ENCOUNTER — Other Ambulatory Visit (HOSPITAL_BASED_OUTPATIENT_CLINIC_OR_DEPARTMENT_OTHER): Payer: Medicare Other | Admitting: Lab

## 2013-02-08 ENCOUNTER — Telehealth: Payer: Self-pay | Admitting: Oncology

## 2013-02-08 VITALS — BP 122/69 | HR 59 | Temp 97.4°F | Resp 18 | Ht 64.0 in | Wt 206.2 lb

## 2013-02-08 DIAGNOSIS — Z7982 Long term (current) use of aspirin: Secondary | ICD-10-CM

## 2013-02-08 DIAGNOSIS — I639 Cerebral infarction, unspecified: Secondary | ICD-10-CM

## 2013-02-08 DIAGNOSIS — L930 Discoid lupus erythematosus: Secondary | ICD-10-CM

## 2013-02-08 DIAGNOSIS — D6859 Other primary thrombophilia: Secondary | ICD-10-CM

## 2013-02-08 DIAGNOSIS — D6862 Lupus anticoagulant syndrome: Secondary | ICD-10-CM

## 2013-02-08 DIAGNOSIS — I6350 Cerebral infarction due to unspecified occlusion or stenosis of unspecified cerebral artery: Secondary | ICD-10-CM

## 2013-02-08 DIAGNOSIS — Z7901 Long term (current) use of anticoagulants: Secondary | ICD-10-CM

## 2013-02-08 DIAGNOSIS — E785 Hyperlipidemia, unspecified: Secondary | ICD-10-CM

## 2013-02-08 DIAGNOSIS — F341 Dysthymic disorder: Secondary | ICD-10-CM

## 2013-02-08 DIAGNOSIS — Z8679 Personal history of other diseases of the circulatory system: Secondary | ICD-10-CM

## 2013-02-08 LAB — CBC WITH DIFFERENTIAL/PLATELET
BASO%: 1.4 % (ref 0.0–2.0)
Eosinophils Absolute: 0.1 10*3/uL (ref 0.0–0.5)
HGB: 15 g/dL (ref 11.6–15.9)
MCHC: 33.5 g/dL (ref 31.5–36.0)
MONO#: 0.5 10*3/uL (ref 0.1–0.9)
NEUT#: 3.3 10*3/uL (ref 1.5–6.5)
Platelets: 232 10*3/uL (ref 145–400)
RBC: 4.83 10*6/uL (ref 3.70–5.45)
RDW: 12.6 % (ref 11.2–14.5)
WBC: 5.7 10*3/uL (ref 3.9–10.3)
lymph#: 1.7 10*3/uL (ref 0.9–3.3)

## 2013-02-08 LAB — COMPREHENSIVE METABOLIC PANEL (CC13)
Albumin: 3.6 g/dL (ref 3.5–5.0)
Alkaline Phosphatase: 93 U/L (ref 40–150)
Anion Gap: 9 mEq/L (ref 3–11)
BUN: 12.2 mg/dL (ref 7.0–26.0)
CO2: 26 mEq/L (ref 22–29)
Calcium: 9.2 mg/dL (ref 8.4–10.4)
Creatinine: 0.8 mg/dL (ref 0.6–1.1)
Glucose: 105 mg/dl (ref 70–140)
Potassium: 4 mEq/L (ref 3.5–5.1)

## 2013-02-08 LAB — PROTIME-INR
INR: 1.9 — ABNORMAL LOW (ref 2.00–3.50)
Protime: 22.8 Seconds — ABNORMAL HIGH (ref 10.6–13.4)

## 2013-02-08 NOTE — Telephone Encounter (Signed)
Gave pt appt for lab on November and on April 2015 lab and MD

## 2013-02-08 NOTE — Progress Notes (Signed)
INR = 1.9 on Coumadin 5 mg/day Pt recently had ear infection/sore throat.  She was on Amoxicillin for 10 days total. No missed doses of Coumadin. INR slightly below goal but pt has been very stable on current Coumadin dose. I will not adjust her Coumadin dose. Return in 6 weeks. Ebony Hail, Pharm.D., CPP 02/08/2013@3 :18 PM

## 2013-02-09 NOTE — Progress Notes (Signed)
Hematology and Oncology Follow Up Visit  Amanda Turner [id number] 14-May-1945 67 y.o. 02/09/2013 7:13 PM   Principle Diagnosis: Encounter Diagnoses  Name Primary?  . Lupus anticoagulant with hypercoagulable state Yes  . CVA (cerebrovascular accident)   . Lupus erythematosus      Interim History:    Followup visit for this 67 year old woman with history of lupus. 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 She did develop a number of significant perioperative complications including initial CSF leak then subsequent hemorrhagic infarct and then multifocal thrombotic infarcts in her brain. Decision was made to cautiously anticoagulate her. She was found to have a positive lupus type anticoagulant. She has some mild fixed neurologic deficits since that surgery with partial expressive aphasia which is worse when she gets tired. Mild clumsiness on rapid alternating movements of her left hand which has improved significantly over time.  She continues on chronic full dose Coumadin anticoagulation and the drug is monitored through our office. She is also on 81 mg of aspirin.  Overall she is doing well. She just saw her family physician last week on September 26 for a left ear ache. Canal was full of wax which was removed. Drum was erythematous. She was put on a course of amoxicillin by mouth plus Cipro eardrops.  She reports no new neurologic symptoms in fact her speech continues to improve as does her fine coordination.   Medications: reviewed  Allergies:  Allergies  Allergen Reactions  . Lisinopril Cough  . Losartan Swelling  . Peanut Oil     REACTION: hives  . Shrimp Flavor     hives  . Sulfonamide Derivatives     REACTION: shortness of breath    Review of Systems: Constitutional:   No anorexia, fever, weight loss HEENT no sore throat. See above- recent otitis treated Respiratory: No cough or dyspnea Cardiovascular:  No chest  pain or palpitations Gastrointestinal: No abdominal pain or change in bowel habit Genito-Urinary: No urinary tract symptoms Musculoskeletal: No major muscle or bone pain Neurologic: No headache or change in vision. No progressive neurologic changes. Skin: No rash or ecchymosis  Remaining ROS negative.     Physical Exam: Blood pressure 122/69, pulse 59, temperature 97.4 F (36.3 C), temperature source Oral, resp. rate 18, height 5\' 4"  (1.626 m), weight 206 lb 3.2 oz (93.532 kg). Wt Readings from Last 3 Encounters:  02/08/13 206 lb 3.2 oz (93.532 kg)  01/28/13 210 lb (95.255 kg)  01/24/13 210 lb (95.255 kg)     General appearance: Well-nourished Caucasian woman HENNT: Pharynx no erythema or exudate. Left eardrum is dull. No erythema. No exudate Lymph nodes: No cervical, supraclavicular, or axillary adenopathy Breasts: Lungs: Clear to auscultation resonant to percussion Heart: Regular rhythm no murmur Abdomen: Soft, nontender, no mass, no organomegaly Extremities: No edema, no calf tenderness Musculoskeletal: No joint deformities GU: Vascular: No carotid bruits, no cyanosis Neurologic: Mental status intact, cranial nerves grossly normal, PERRLA, optic discs sharp vessels normal, motor strength is 5 over 5, reflexes 1+ symmetric, finger to finger and rapid alternating movements now normal. Speech is not fluent. Skin: No rash or ecchymosis  Lab Results: CBC W/Diff    Component Value Date/Time   WBC 5.7 02/08/2013 1455   WBC 6.6 01/24/2013 1012   RBC 4.83 02/08/2013 1455   RBC 4.86 01/24/2013 1012   HGB 15.0 02/08/2013 1455   HGB 15.2* 01/24/2013 1012   HCT 44.7 02/08/2013 1455   HCT 44.6 01/24/2013 1012  PLT 232 02/08/2013 1455   PLT 229.0 01/24/2013 1012   MCV 92.5 02/08/2013 1455   MCV 91.8 01/24/2013 1012   MCH 31.0 02/08/2013 1455   MCH 31.3 05/29/2009 1417   MCHC 33.5 02/08/2013 1455   MCHC 34.1 01/24/2013 1012   RDW 12.6 02/08/2013 1455   RDW 12.6 01/24/2013 1012   LYMPHSABS  1.7 02/08/2013 1455   LYMPHSABS 2.1 01/24/2013 1012   MONOABS 0.5 02/08/2013 1455   MONOABS 0.6 01/24/2013 1012   EOSABS 0.1 02/08/2013 1455   EOSABS 0.2 01/24/2013 1012   BASOSABS 0.1 02/08/2013 1455   BASOSABS 0.1 01/24/2013 1012     Chemistry      Component Value Date/Time   NA 142 02/08/2013 1455   NA 139 01/24/2013 1012   K 4.0 02/08/2013 1455   K 3.3* 01/24/2013 1012   CL 105 01/24/2013 1012   CL 104 07/22/2012 1426   CO2 26 02/08/2013 1455   CO2 26 01/24/2013 1012   BUN 12.2 02/08/2013 1455   BUN 17 01/24/2013 1012   CREATININE 0.8 02/08/2013 1455   CREATININE 0.8 01/24/2013 1012      Component Value Date/Time   CALCIUM 9.2 02/08/2013 1455   CALCIUM 8.8 01/24/2013 1012   ALKPHOS 93 02/08/2013 1455   ALKPHOS 79 01/24/2013 1012   AST 18 02/08/2013 1455   AST 18 01/24/2013 1012   ALT 24 02/08/2013 1455   ALT 24 01/24/2013 1012   BILITOT 0.45 02/08/2013 1455   BILITOT 0.6 01/24/2013 1012     Impression: #1. 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   #2 status post resection pituitary adenoma with complications as outlined above.  She remains neurologically stable at this time with mild chronic fixed deficits.   #3. Lupus erythematosus   #4 reactive depression   #5 hyperlipidemia   Plan is continue long-term anticoagulation with Coumadin and aspirin.   CC:. Dr. Smitty [facility]; Pramod Sharene Skeans, MD 10/8/20147:13 PM

## 2013-02-25 ENCOUNTER — Encounter: Payer: Self-pay | Admitting: Internal Medicine

## 2013-02-25 ENCOUNTER — Other Ambulatory Visit: Payer: Self-pay | Admitting: Family Medicine

## 2013-02-28 MED ORDER — FAMOTIDINE 20 MG PO TABS: 20.0000 mg | ORAL_TABLET | Freq: Two times a day (BID) | ORAL | Status: DC

## 2013-03-02 ENCOUNTER — Other Ambulatory Visit: Payer: Medicare Other

## 2013-03-09 ENCOUNTER — Other Ambulatory Visit: Payer: Self-pay | Admitting: Oncology

## 2013-03-17 ENCOUNTER — Encounter: Payer: Self-pay | Admitting: Internal Medicine

## 2013-03-22 ENCOUNTER — Ambulatory Visit (HOSPITAL_BASED_OUTPATIENT_CLINIC_OR_DEPARTMENT_OTHER): Payer: Medicare Other | Admitting: Lab

## 2013-03-22 ENCOUNTER — Ambulatory Visit (HOSPITAL_BASED_OUTPATIENT_CLINIC_OR_DEPARTMENT_OTHER): Payer: Self-pay | Admitting: Pharmacist

## 2013-03-22 DIAGNOSIS — I6350 Cerebral infarction due to unspecified occlusion or stenosis of unspecified cerebral artery: Secondary | ICD-10-CM

## 2013-03-22 DIAGNOSIS — D6862 Lupus anticoagulant syndrome: Secondary | ICD-10-CM

## 2013-03-22 DIAGNOSIS — Z7901 Long term (current) use of anticoagulants: Secondary | ICD-10-CM

## 2013-03-22 DIAGNOSIS — D6859 Other primary thrombophilia: Secondary | ICD-10-CM

## 2013-03-22 DIAGNOSIS — L930 Discoid lupus erythematosus: Secondary | ICD-10-CM

## 2013-03-22 DIAGNOSIS — I639 Cerebral infarction, unspecified: Secondary | ICD-10-CM

## 2013-03-22 LAB — POCT INR: INR: 2.1

## 2013-03-22 LAB — PROTIME-INR: Protime: 25.2 Seconds — ABNORMAL HIGH (ref 10.6–13.4)

## 2013-03-22 NOTE — Patient Instructions (Signed)
INR at goal No changes Continue 5mg  daily.  Recheck INR in 6 weeks. Return on 05/10/12 at 2:30 pm for lab & 2:45 pm for coumadin clinic.

## 2013-03-22 NOTE — Progress Notes (Signed)
INR at goal Pt is doing well today, She is seen in clinic with her husband No issues regarding bleeding or bruising Pt reports no missed Doses or extra doses No medication or diet changes Pt is stable on current dose, No changes to current regimen Plan: Continue 5mg  daily.  Recheck INR in 6 weeks. Return on 05/10/12 at 2:30 pm for lab & 2:45 pm for coumadin clinic.

## 2013-03-24 LAB — LUPUS ANTICOAGULANT PANEL
DRVVT 1:1 Mix: 52.3 secs — ABNORMAL HIGH (ref <42.9)
DRVVT: 94.4 secs — ABNORMAL HIGH (ref <42.9)
Drvvt confirmation: 1.31 Ratio — ABNORMAL HIGH (ref <1.11)
PTT Lupus Anticoagulant: 65.9 secs — ABNORMAL HIGH (ref 28.0–43.0)
PTTLA 4:1 Mix: 50 secs — ABNORMAL HIGH (ref 28.0–43.0)
PTTLA Confirmation: 8 secs — ABNORMAL HIGH (ref <8.0)

## 2013-03-24 LAB — BETA-2 GLYCOPROTEIN ANTIBODIES: Beta-2 Glyco I IgG: 4 G Units (ref <20)

## 2013-03-24 LAB — ANA: Anti Nuclear Antibody(ANA): NEGATIVE

## 2013-03-28 ENCOUNTER — Other Ambulatory Visit: Payer: Self-pay | Admitting: Internal Medicine

## 2013-03-28 ENCOUNTER — Other Ambulatory Visit: Payer: Self-pay | Admitting: Family Medicine

## 2013-03-30 ENCOUNTER — Encounter: Payer: Self-pay | Admitting: Internal Medicine

## 2013-03-30 ENCOUNTER — Other Ambulatory Visit: Payer: Self-pay | Admitting: Internal Medicine

## 2013-03-30 ENCOUNTER — Encounter: Payer: Self-pay | Admitting: Oncology

## 2013-04-01 ENCOUNTER — Encounter: Payer: Medicare Other | Admitting: Internal Medicine

## 2013-04-04 ENCOUNTER — Encounter: Payer: Self-pay | Admitting: Internal Medicine

## 2013-04-04 ENCOUNTER — Encounter: Payer: Self-pay | Admitting: Oncology

## 2013-04-05 ENCOUNTER — Encounter: Payer: Self-pay | Admitting: *Deleted

## 2013-04-06 ENCOUNTER — Encounter: Payer: Self-pay | Admitting: Nurse Practitioner

## 2013-04-13 ENCOUNTER — Encounter: Payer: Self-pay | Admitting: Internal Medicine

## 2013-04-21 ENCOUNTER — Other Ambulatory Visit: Payer: Medicare Other | Admitting: Lab

## 2013-04-24 ENCOUNTER — Other Ambulatory Visit: Payer: Self-pay | Admitting: Internal Medicine

## 2013-04-25 ENCOUNTER — Other Ambulatory Visit: Payer: Self-pay | Admitting: Internal Medicine

## 2013-05-10 ENCOUNTER — Ambulatory Visit (HOSPITAL_BASED_OUTPATIENT_CLINIC_OR_DEPARTMENT_OTHER): Payer: Medicare Other | Admitting: Pharmacist

## 2013-05-10 ENCOUNTER — Other Ambulatory Visit (HOSPITAL_BASED_OUTPATIENT_CLINIC_OR_DEPARTMENT_OTHER): Payer: Medicare Other

## 2013-05-10 DIAGNOSIS — Z7901 Long term (current) use of anticoagulants: Secondary | ICD-10-CM

## 2013-05-10 DIAGNOSIS — D6859 Other primary thrombophilia: Secondary | ICD-10-CM

## 2013-05-10 DIAGNOSIS — I639 Cerebral infarction, unspecified: Secondary | ICD-10-CM

## 2013-05-10 LAB — PROTIME-INR
INR: 1.8 — ABNORMAL LOW (ref 2.00–3.50)
Protime: 21.6 Seconds — ABNORMAL HIGH (ref 10.6–13.4)

## 2013-05-10 LAB — POCT INR: INR: 1.8

## 2013-05-10 NOTE — Progress Notes (Signed)
INR=1.8 on coumadin 5mg  daily.  Amanda Turner has no medication changes.  No bleeding or bruising.  She has started drinking green tea, which contains Vit K.  She has also had an increased intake of broccoli and greens.  This could explain why her INR is on the low side.  Amanda Turner has been on current coumadin dose for over 1 yr.  Will not change coumadin dose but will check PT/INR in 3 weeks, instead of 6 weeks.

## 2013-05-31 ENCOUNTER — Ambulatory Visit (HOSPITAL_BASED_OUTPATIENT_CLINIC_OR_DEPARTMENT_OTHER): Payer: Self-pay | Admitting: Pharmacist

## 2013-05-31 ENCOUNTER — Other Ambulatory Visit (HOSPITAL_BASED_OUTPATIENT_CLINIC_OR_DEPARTMENT_OTHER): Payer: Medicare Other

## 2013-05-31 DIAGNOSIS — D6859 Other primary thrombophilia: Secondary | ICD-10-CM

## 2013-05-31 DIAGNOSIS — I639 Cerebral infarction, unspecified: Secondary | ICD-10-CM

## 2013-05-31 LAB — POCT INR: INR: 2

## 2013-05-31 LAB — PROTIME-INR
INR: 2 (ref 2.00–3.50)
Protime: 24 Seconds — ABNORMAL HIGH (ref 10.6–13.4)

## 2013-05-31 NOTE — Progress Notes (Signed)
INR = 2 on Coumadin 5 mg daily Pt has cut back on her greens/broccoli. She still has green tea occasionally (every other day or so). No complaints re: anticoag. No recent med changes. INR at goal.  No change to Coumadin dose. Return in 6 weeks.   Kennith Center, Pharm.D., CPP 05/31/2013@3 :07 PM

## 2013-06-24 ENCOUNTER — Other Ambulatory Visit: Payer: Self-pay | Admitting: Internal Medicine

## 2013-07-01 ENCOUNTER — Other Ambulatory Visit: Payer: Self-pay | Admitting: Oncology

## 2013-07-02 ENCOUNTER — Encounter: Payer: Self-pay | Admitting: Oncology

## 2013-07-12 ENCOUNTER — Other Ambulatory Visit (HOSPITAL_BASED_OUTPATIENT_CLINIC_OR_DEPARTMENT_OTHER): Payer: Medicare Other

## 2013-07-12 ENCOUNTER — Ambulatory Visit (HOSPITAL_BASED_OUTPATIENT_CLINIC_OR_DEPARTMENT_OTHER): Payer: Self-pay | Admitting: Pharmacist

## 2013-07-12 DIAGNOSIS — D6859 Other primary thrombophilia: Secondary | ICD-10-CM

## 2013-07-12 DIAGNOSIS — Z7901 Long term (current) use of anticoagulants: Secondary | ICD-10-CM

## 2013-07-12 DIAGNOSIS — I639 Cerebral infarction, unspecified: Secondary | ICD-10-CM

## 2013-07-12 LAB — PROTIME-INR
INR: 2 (ref 2.00–3.50)
Protime: 24 Seconds — ABNORMAL HIGH (ref 10.6–13.4)

## 2013-07-12 LAB — POCT INR: INR: 2

## 2013-07-12 NOTE — Progress Notes (Signed)
INR continue to be at goal of 2-3 on coumadin 5mg  daily.  Amanda Turner is finishing a course of amoxicillin after a root canal.  No bleeding or bruising.  No other changes to report.  Will continue coumadin 5mg  daily and check PT/INR in 6 weeks.

## 2013-07-18 ENCOUNTER — Other Ambulatory Visit: Payer: Self-pay | Admitting: Internal Medicine

## 2013-08-09 ENCOUNTER — Ambulatory Visit: Payer: Medicare Other | Admitting: Oncology

## 2013-08-09 ENCOUNTER — Other Ambulatory Visit: Payer: Medicare Other

## 2013-08-18 ENCOUNTER — Other Ambulatory Visit: Payer: Self-pay | Admitting: Family Medicine

## 2013-08-18 ENCOUNTER — Telehealth: Payer: Self-pay | Admitting: Internal Medicine

## 2013-08-18 NOTE — Telephone Encounter (Signed)
Can you please call patient, pt needs a appt to be seen.

## 2013-08-18 NOTE — Telephone Encounter (Signed)
Called pt and advise she need to make an appt, she stated she would call tomorrow and make the appt.

## 2013-08-18 NOTE — Telephone Encounter (Signed)
Pt states she was seen a while back by dr. Elease Hashimoto and he had written her a rx for metronibazole, pt would like to know if he can refill her prescription for it because she has run out. Pt is aware that she may need to be seen again before the rx can be filled.

## 2013-08-22 ENCOUNTER — Encounter: Payer: Self-pay | Admitting: Internal Medicine

## 2013-08-24 ENCOUNTER — Ambulatory Visit (HOSPITAL_BASED_OUTPATIENT_CLINIC_OR_DEPARTMENT_OTHER): Payer: Self-pay | Admitting: Pharmacist

## 2013-08-24 ENCOUNTER — Other Ambulatory Visit (HOSPITAL_BASED_OUTPATIENT_CLINIC_OR_DEPARTMENT_OTHER): Payer: Medicare Other

## 2013-08-24 DIAGNOSIS — Z7901 Long term (current) use of anticoagulants: Secondary | ICD-10-CM

## 2013-08-24 DIAGNOSIS — I639 Cerebral infarction, unspecified: Secondary | ICD-10-CM

## 2013-08-24 DIAGNOSIS — D6859 Other primary thrombophilia: Secondary | ICD-10-CM

## 2013-08-24 LAB — PROTIME-INR
INR: 2.3 (ref 2.00–3.50)
Protime: 27.6 Seconds — ABNORMAL HIGH (ref 10.6–13.4)

## 2013-08-24 LAB — POCT INR: INR: 2.3

## 2013-08-24 NOTE — Progress Notes (Signed)
INR remains at goal.  No changes in medications.  No bleeding or bruising.  Will continue coumadin 5mg  daily and check PT/INR in 6 weeks.  It was a pleasure seeing Amanda and Amanda Turner today.

## 2013-08-29 ENCOUNTER — Other Ambulatory Visit: Payer: Self-pay | Admitting: Internal Medicine

## 2013-09-27 ENCOUNTER — Other Ambulatory Visit: Payer: Self-pay | Admitting: Internal Medicine

## 2013-10-04 ENCOUNTER — Other Ambulatory Visit (HOSPITAL_BASED_OUTPATIENT_CLINIC_OR_DEPARTMENT_OTHER): Payer: Medicare Other

## 2013-10-04 ENCOUNTER — Ambulatory Visit (HOSPITAL_BASED_OUTPATIENT_CLINIC_OR_DEPARTMENT_OTHER): Payer: Medicare Other | Admitting: Pharmacist

## 2013-10-04 DIAGNOSIS — I639 Cerebral infarction, unspecified: Secondary | ICD-10-CM

## 2013-10-04 DIAGNOSIS — D6859 Other primary thrombophilia: Secondary | ICD-10-CM

## 2013-10-04 LAB — PROTIME-INR
INR: 2.2 (ref 2.00–3.50)
Protime: 26.4 Seconds — ABNORMAL HIGH (ref 10.6–13.4)

## 2013-10-04 LAB — POCT INR: INR: 2.2

## 2013-10-04 NOTE — Progress Notes (Signed)
INR remains at goal.  No changes in meds.  No bleeding/bruising.  Will continue coumadin 5mg  daily and check PT/INR in 2 months.  Amanda Turner has been on same dose of coumadin since 02/2012.  It was a pleasure seeing Amanda Amanda Turner and her husband today.

## 2013-10-28 ENCOUNTER — Encounter: Payer: Self-pay | Admitting: Internal Medicine

## 2013-11-01 ENCOUNTER — Other Ambulatory Visit: Payer: Self-pay | Admitting: Oncology

## 2013-11-01 ENCOUNTER — Other Ambulatory Visit: Payer: Self-pay | Admitting: Internal Medicine

## 2013-11-02 ENCOUNTER — Telehealth: Payer: Self-pay | Admitting: Internal Medicine

## 2013-11-02 ENCOUNTER — Encounter: Payer: Self-pay | Admitting: Oncology

## 2013-11-02 NOTE — Telephone Encounter (Signed)
Pt would like to know if you will accept her as a pt? She is a [employer] pt. She also said to tell you her sister is Don Broach (?spelling)

## 2013-11-03 ENCOUNTER — Ambulatory Visit (INDEPENDENT_AMBULATORY_CARE_PROVIDER_SITE_OTHER): Payer: Medicare Other | Admitting: Physician Assistant

## 2013-11-03 ENCOUNTER — Encounter: Payer: Self-pay | Admitting: Physician Assistant

## 2013-11-03 VITALS — BP 120/80 | HR 66 | Temp 98.4°F | Resp 18 | Wt 204.0 lb

## 2013-11-03 DIAGNOSIS — L259 Unspecified contact dermatitis, unspecified cause: Secondary | ICD-10-CM

## 2013-11-03 MED ORDER — METHYLPREDNISOLONE 4 MG PO KIT
PACK | ORAL | Status: DC
Start: 2013-11-03 — End: 2014-08-09

## 2013-11-03 NOTE — Progress Notes (Signed)
Subjective:    Patient ID: Amanda Turner, female    DOB: 17-Sep-1945, 68 y.o.   MRN: [id number]  Rash This is a new problem. The current episode started 1 to 4 weeks ago. The problem is unchanged. The affected locations include the face and neck. The rash is characterized by burning, redness and itchiness. She was exposed to a new detergent/soap. Pertinent negatives include no anorexia, congestion, cough, diarrhea, eye pain, facial edema, fatigue, fever, joint pain, nail changes, rhinorrhea, shortness of breath, sore throat or vomiting. Past treatments include cold compress (benadryl.). The treatment provided no relief. There is no history of allergies, asthma or eczema.  Pt had new soap, however thought it may be cause of rash and discontinued yesterday. States that rash actually appears to be a little better today.    Review of Systems  Constitutional: Negative for fever, chills and fatigue.  HENT: Negative for congestion, rhinorrhea and sore throat.   Eyes: Negative for pain.  Respiratory: Negative for cough and shortness of breath.   Cardiovascular: Negative for chest pain.  Gastrointestinal: Negative for nausea, vomiting, diarrhea and anorexia.  Musculoskeletal: Negative for joint pain.  Skin: Positive for rash. Negative for nail changes.  All other systems reviewed and are negative.    Past Medical History  Diagnosis Date  . Depression   . Hyperlipemia   . Hypertension   . Lupus   . Elevated LFT's   . HA (headache)   . Lupus anticoagulant disorder   . Stroke   . 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 Anxiety   . Intracranial hemorrhage, spontaneous intraparenchymal, associated with coagulopathy, remote, resolved 06/23/2011  . Lupus anticoagulant with hypercoagulable state 06/23/2011  . Lupus erythematosus 06/23/2011    History   Social History  . Marital Status: Married    Spouse Name: N/A    Number of Children: 2  . Years of  Education: N/A   Occupational History  .     Social History Main Topics  . Smoking status: Never Smoker   . Smokeless tobacco: Never Used  . Alcohol Use: No  . Drug Use: No  . Sexual Activity: Not on file   Other Topics Concern  . Not on file   Social History Narrative   2 cups of coffee daily    Past Surgical History  Procedure Laterality Date  . Abdominal hysterectomy    . Transphenoidal pituitary resection      Family History  Problem Relation Age of Onset  . Diabetes Brother   . Diabetes Sister   . Hypertension Mother   . Colon cancer Neg Hx     Allergies  Allergen Reactions  . Lisinopril Cough  . Losartan Swelling  . Peanut Oil     REACTION: hives  . Shrimp Flavor     hives  . Sulfonamide Derivatives     REACTION: shortness of breath    Current Outpatient Prescriptions on File Prior to Visit  Medication Sig Dispense Refill  . ALPRAZolam (XANAX) 0.5 MG tablet TAKE 1 TABLET BY MOUTH EVERY DAY  30 tablet  3  . aspirin 81 MG tablet Take 81 mg by mouth daily.        . bifidobacterium infantis (ALIGN) capsule Take 1 capsule by mouth daily.  7 capsule  0  . citalopram (CELEXA) 20 MG tablet Take 1 tablet (20 mg total) by mouth daily.  90 tablet  3  . famotidine (PEPCID) 20 MG tablet  TAKE 1 TABLET BY MOUTH TWICE A DAY  60 tablet  4  . furosemide (LASIX) 40 MG tablet TAKE 1 TABLET (40 MG TOTAL) BY MOUTH DAILY.  30 tablet  5  . KLOR-CON M20 20 MEQ tablet TAKE 1 TABLET BY MOUTH TWICE A DAY  180 tablet  3  . lisinopril (PRINIVIL,ZESTRIL) 5 MG tablet TAKE 1 TABLET (5 MG TOTAL) BY MOUTH DAILY.  30 tablet  5  . lisinopril (PRINIVIL,ZESTRIL) 5 MG tablet TAKE 1 TABLET (5 MG TOTAL) BY MOUTH DAILY.  30 tablet  5  . simvastatin (ZOCOR) 20 MG tablet TAKE 1 TABLET BY MOUTH AT BEDTIME  90 tablet  3  . simvastatin (ZOCOR) 20 MG tablet TAKE 1 TABLET BY MOUTH AT BEDTIME  90 tablet  3  . warfarin (COUMADIN) 5 MG tablet TAKE 1 TABLET BY MOUTH EVERY DAY  30 tablet  3   No current  facility-administered medications on file prior to visit.    EXAM: BP 120/80  Pulse 66  Temp(Src) 98.4 F (36.9 C) (Oral)  Resp 18  Wt 204 lb (92.534 kg)     Objective:   Physical Exam  Nursing note and vitals reviewed. Constitutional: She is oriented to person, place, and time. She appears well-developed and well-nourished. No distress.  HENT:  Head: Normocephalic and atraumatic.  Eyes: Conjunctivae and EOM are normal. Pupils are equal, round, and reactive to light.  Neck: Normal range of motion. Neck supple. No JVD present. No tracheal deviation present.  Cardiovascular: Normal rate, regular rhythm and intact distal pulses.   Pulmonary/Chest: Effort normal and breath sounds normal. No stridor. No respiratory distress. She exhibits no tenderness.  Musculoskeletal: Normal range of motion.  Lymphadenopathy:    She has no cervical adenopathy.  Neurological: She is alert and oriented to person, place, and time.  Skin: Skin is warm and dry. Rash noted. She is not diaphoretic. There is erythema. No pallor.  There is a band pattern erythematous nonraised rash across the anterior neck. No tenderness, no warmth, no swelling, no fluctuance, no wound or foreign body.  Psychiatric: She has a normal mood and affect. Her behavior is normal. Judgment and thought content normal.    Lab Results  Component Value Date   WBC 5.7 02/08/2013   HGB 15.0 02/08/2013   HCT 44.7 02/08/2013   PLT 232 02/08/2013   GLUCOSE 105 02/08/2013   CHOL 173 01/24/2013   TRIG 149.0 01/24/2013   HDL 59.30 01/24/2013   LDLDIRECT 112.6 04/23/2011   LDLCALC 84 01/24/2013   ALT 24 02/08/2013   AST 18 02/08/2013   NA 142 02/08/2013   K 4.0 02/08/2013   CL 105 01/24/2013   CREATININE 0.8 02/08/2013   BUN 12.2 02/08/2013   CO2 26 02/08/2013   TSH 1.33 01/24/2013   INR 2.20 10/04/2013   HGBA1C 5.5 11/28/2011         Assessment & Plan:  Amanda Turner was seen today for rash.  Diagnoses and associated orders for this  visit:  Contact dermatitis Comments: Continue to not use the new soap. Medrol dosepak, hydrocortisone cream, cool compresses.  - methylPREDNISolone (MEDROL, PAK,) 4 MG tablet; follow package directions    Patient will continue what symptoms for improvement return if symptoms do not improve despite treatment.  Return precautions provided, and patient handout on contact dermatitis.  Plan to follow up as needed, or for worsening or persistent symptoms despite treatment.  Patient Instructions  Medrol Dosepak, follow pack instruction. Take with food  to prevent nausea.  Over the counter Hydrocortisone cream applied twice daily to the area can help relieve itching.  Cool compresses can help relieve pain and itching.  Continue to not use your new soap.  If emergency symptoms discussed during visit developed, seek medical attention immediately.  Followup as needed, or for worsening or persistent symptoms despite treatment.

## 2013-11-03 NOTE — Patient Instructions (Signed)
Medrol Dosepak, follow pack instruction. Take with food to prevent nausea.  Over the counter Hydrocortisone cream applied twice daily to the area can help relieve itching.  Cool compresses can help relieve pain and itching.  Continue to not use your new soap.  If emergency symptoms discussed during visit developed, seek medical attention immediately.  Followup as needed, or for worsening or persistent symptoms despite treatment.   Contact Dermatitis Contact dermatitis is a rash that happens when something touches the skin. You touched something that irritates your skin, or you have allergies to something you touched. HOME CARE   Avoid the thing that caused your rash.  Keep your rash away from hot water, soap, sunlight, chemicals, and other things that might bother it.  Do not scratch your rash.  You can take cool baths to help stop itching.  Only take medicine as told by your doctor.  Keep all doctor visits as told. GET HELP RIGHT AWAY IF:   Your rash is not better after 3 days.  Your rash gets worse.  Your rash is puffy (swollen), tender, red, sore, or warm.  You have problems with your medicine. MAKE SURE YOU:   Understand these instructions.  Will watch your condition.  Will get help right away if you are not doing well or get worse. Document Released: 02/16/2009 Document Revised: 07/14/2011 Document Reviewed: 09/24/2010 [facility] Patient Information 2015 [place], [place]. This information is not intended to replace advice given to you by your health care provider. Make sure you discuss any questions you have with your health care provider.

## 2013-11-03 NOTE — Progress Notes (Signed)
Pre visit review using our clinic review tool, if applicable. No additional management support is needed unless otherwise documented below in the visit note. 

## 2013-11-07 ENCOUNTER — Encounter: Payer: Self-pay | Admitting: Internal Medicine

## 2013-11-11 ENCOUNTER — Encounter: Payer: Self-pay | Admitting: Internal Medicine

## 2013-12-02 ENCOUNTER — Telehealth: Payer: Self-pay | Admitting: Pharmacist

## 2013-12-02 NOTE — Telephone Encounter (Signed)
Amanda Turner called to cancel appointments for 12/06/13 for lab and coumadin clinic for [facility].  Pt is unable to come that day. He will call back to reschedule for another date.

## 2013-12-06 ENCOUNTER — Ambulatory Visit: Payer: Medicare Other

## 2013-12-06 ENCOUNTER — Other Ambulatory Visit: Payer: Medicare Other

## 2013-12-15 ENCOUNTER — Telehealth: Payer: Self-pay | Admitting: Oncology

## 2013-12-15 NOTE — Telephone Encounter (Signed)
returned call to pt's husband re lb/CC and gv him new appt for 8/7 @ 3pm. d/t per husband. appt r/s from 8/4.

## 2013-12-17 ENCOUNTER — Other Ambulatory Visit: Payer: Self-pay | Admitting: Family Medicine

## 2013-12-19 ENCOUNTER — Other Ambulatory Visit (HOSPITAL_BASED_OUTPATIENT_CLINIC_OR_DEPARTMENT_OTHER): Payer: Medicare Other

## 2013-12-19 ENCOUNTER — Ambulatory Visit (HOSPITAL_BASED_OUTPATIENT_CLINIC_OR_DEPARTMENT_OTHER): Payer: Medicare Other | Admitting: Pharmacist

## 2013-12-19 DIAGNOSIS — I6350 Cerebral infarction due to unspecified occlusion or stenosis of unspecified cerebral artery: Secondary | ICD-10-CM

## 2013-12-19 DIAGNOSIS — D6859 Other primary thrombophilia: Secondary | ICD-10-CM

## 2013-12-19 DIAGNOSIS — I639 Cerebral infarction, unspecified: Secondary | ICD-10-CM

## 2013-12-19 LAB — PROTIME-INR
INR: 2.2 (ref 2.00–3.50)
Protime: 26.4 Seconds — ABNORMAL HIGH (ref 10.6–13.4)

## 2013-12-19 LAB — POCT INR: INR: 2.2

## 2013-12-19 NOTE — Patient Instructions (Signed)
INR at goal No changes Continue 5mg  daily.  Recheck INR in 2 months. Return on 02/14/14 at 3:00 pm for lab & 3:15 pm for coumadin clinic.

## 2013-12-19 NOTE — Progress Notes (Signed)
INR at goal Patient is doing well with no complaints No unusual bleeding or bruising No missed or extra doses No medication or diet changes (patient states she is trying to eat healthier but no real increase in "greens") INR is quite stable: No changes Continue 5mg  daily.  Recheck INR in 2 months. Return on 02/14/14 at 3:00 pm for lab & 3:15 pm for coumadin clinic.

## 2014-01-04 NOTE — Telephone Encounter (Signed)
Pt following up on request if you will accept. Pt would like both she and her husband to be your pts. Will you accept?

## 2014-01-05 ENCOUNTER — Other Ambulatory Visit: Payer: Self-pay | Admitting: Internal Medicine

## 2014-01-07 ENCOUNTER — Encounter: Payer: Self-pay | Admitting: Internal Medicine

## 2014-01-10 ENCOUNTER — Encounter: Payer: Self-pay | Admitting: Internal Medicine

## 2014-01-11 ENCOUNTER — Encounter: Payer: Self-pay | Admitting: Internal Medicine

## 2014-01-18 ENCOUNTER — Other Ambulatory Visit: Payer: Self-pay

## 2014-01-18 DIAGNOSIS — Z1231 Encounter for screening mammogram for malignant neoplasm of breast: Secondary | ICD-10-CM

## 2014-01-25 ENCOUNTER — Ambulatory Visit
Admission: RE | Admit: 2014-01-25 | Discharge: 2014-01-25 | Disposition: A | Discharge status: Home / Self Care | Payer: Medicare Other | Source: Ambulatory Visit

## 2014-01-25 DIAGNOSIS — Z1231 Encounter for screening mammogram for malignant neoplasm of breast: Secondary | ICD-10-CM

## 2014-01-27 ENCOUNTER — Telehealth: Payer: Self-pay | Admitting: Internal Medicine

## 2014-01-27 NOTE — Telephone Encounter (Signed)
Pt needs an OV  

## 2014-01-27 NOTE — Telephone Encounter (Signed)
[id number] - [place], [place] - [address] is requesting re-fill on citalopram (CELEXA) 20 MG tablet

## 2014-02-01 ENCOUNTER — Encounter: Payer: Self-pay | Admitting: Oncology

## 2014-02-03 ENCOUNTER — Encounter: Payer: Self-pay | Admitting: Internal Medicine

## 2014-02-14 ENCOUNTER — Ambulatory Visit (HOSPITAL_BASED_OUTPATIENT_CLINIC_OR_DEPARTMENT_OTHER): Payer: Self-pay | Admitting: Pharmacist

## 2014-02-14 ENCOUNTER — Other Ambulatory Visit (HOSPITAL_BASED_OUTPATIENT_CLINIC_OR_DEPARTMENT_OTHER): Payer: Medicare Other

## 2014-02-14 DIAGNOSIS — I639 Cerebral infarction, unspecified: Secondary | ICD-10-CM

## 2014-02-14 DIAGNOSIS — I6350 Cerebral infarction due to unspecified occlusion or stenosis of unspecified cerebral artery: Secondary | ICD-10-CM

## 2014-02-14 DIAGNOSIS — D6852 Prothrombin gene mutation: Secondary | ICD-10-CM

## 2014-02-14 LAB — POCT INR: INR: 2.4

## 2014-02-14 LAB — PROTIME-INR
INR: 2.4 (ref 2.00–3.50)
Protime: 28.8 Seconds — ABNORMAL HIGH (ref 10.6–13.4)

## 2014-02-14 NOTE — Progress Notes (Signed)
INR = 2.4 on Coumadin 5 mg daily Pt is on Ceftin for another 3 days (to complete 10 days total) for otitis media.  She is also using Flonase short term for sinus pressure. She continues on Celexa.  This can increase risk for bleeding by platelet inhibition, but does not increase INR. Pt did have a bruise recently on L ring finger from closing it in her computer desk drawer.  The bruise is better now. Pts INR is stable today.  No apparent affect from the Ceftin, although it is known that Ceftin may cause INR elevations. I will make no change to her Coumadin dose. Return in 2 months. Pt knows to call before then if she develops bleeding that cannot be controlled/stopped. Pts husband requested Dr. Azucena Freed office # so they can set up an appt w/ him soon. Kennith Center, Pharm.D., CPP 02/14/2014@3 :33 PM

## 2014-03-01 ENCOUNTER — Other Ambulatory Visit: Payer: Self-pay | Admitting: Oncology

## 2014-03-03 ENCOUNTER — Other Ambulatory Visit: Payer: Self-pay | Admitting: Oncology

## 2014-04-03 ENCOUNTER — Encounter: Payer: Self-pay | Admitting: Oncology

## 2014-04-10 ENCOUNTER — Telehealth: Payer: Self-pay | Admitting: Pharmacist

## 2014-04-10 NOTE — Telephone Encounter (Signed)
Pt moved lab/Coumadin clinic appt to 05/09/14 at 3 pm/3:15 pm.  Kennith Center, Pharm.D., CPP 04/10/2014@2 :30 PM

## 2014-04-11 ENCOUNTER — Other Ambulatory Visit: Payer: Medicare Other

## 2014-04-11 ENCOUNTER — Ambulatory Visit: Payer: Medicare Other

## 2014-04-26 ENCOUNTER — Other Ambulatory Visit: Payer: Self-pay | Admitting: Internal Medicine

## 2014-05-09 ENCOUNTER — Ambulatory Visit (HOSPITAL_BASED_OUTPATIENT_CLINIC_OR_DEPARTMENT_OTHER): Payer: Commercial Managed Care - HMO | Admitting: Pharmacist

## 2014-05-09 ENCOUNTER — Other Ambulatory Visit (HOSPITAL_BASED_OUTPATIENT_CLINIC_OR_DEPARTMENT_OTHER): Payer: Commercial Managed Care - HMO

## 2014-05-09 DIAGNOSIS — D6852 Prothrombin gene mutation: Secondary | ICD-10-CM

## 2014-05-09 DIAGNOSIS — I639 Cerebral infarction, unspecified: Secondary | ICD-10-CM

## 2014-05-09 DIAGNOSIS — I6350 Cerebral infarction due to unspecified occlusion or stenosis of unspecified cerebral artery: Secondary | ICD-10-CM

## 2014-05-09 LAB — POCT INR: INR: 2.5

## 2014-05-09 LAB — PROTIME-INR
INR: 2.5 (ref 2.00–3.50)
Protime: 30 Seconds — ABNORMAL HIGH (ref 10.6–13.4)

## 2014-05-09 NOTE — Patient Instructions (Signed)
Dr. Beryle Beams patient - Internal medicine clinic INR at goal Pt is doing well with no complaints No unusual bleeding or bruising No missed or extra doses Pt has been stable on current dose No medication or diet changes Plan: No changes Continue 5mg  daily.  Recheck INR in 2 months. Return on 07/11/14 at 3:00 pm for lab & 3:15 pm for coumadin clinic.

## 2014-05-09 NOTE — Progress Notes (Signed)
Dr. Beryle Beams patient - Internal medicine clinic INR at goal Pt is doing well with no complaints No unusual bleeding or bruising No missed or extra doses Pt has been stable on current dose No medication or diet changes Plan: No changes Continue 5mg  daily.  Recheck INR in 2 months. Return on 07/11/14 at 3:00 pm for lab & 3:15 pm for coumadin clinic.

## 2014-05-18 ENCOUNTER — Other Ambulatory Visit: Payer: Self-pay | Admitting: Internal Medicine

## 2014-05-18 DIAGNOSIS — R319 Hematuria, unspecified: Secondary | ICD-10-CM

## 2014-05-23 ENCOUNTER — Ambulatory Visit
Admission: RE | Admit: 2014-05-23 | Discharge: 2014-05-23 | Disposition: A | Discharge status: Home / Self Care | Payer: Commercial Managed Care - HMO | Source: Ambulatory Visit | Attending: Internal Medicine | Admitting: Internal Medicine

## 2014-05-23 DIAGNOSIS — R319 Hematuria, unspecified: Secondary | ICD-10-CM

## 2014-05-23 MED ORDER — IOHEXOL 300 MG/ML  SOLN
125.0000 mL | Freq: Once | INTRAMUSCULAR | Status: AC | PRN
  Administered 2014-05-23: 125 mL via INTRAVENOUS

## 2014-06-25 ENCOUNTER — Other Ambulatory Visit: Payer: Self-pay | Admitting: Internal Medicine

## 2014-06-27 ENCOUNTER — Other Ambulatory Visit: Payer: Self-pay | Admitting: Internal Medicine

## 2014-07-05 ENCOUNTER — Encounter: Payer: Self-pay | Admitting: Oncology

## 2014-07-06 ENCOUNTER — Encounter: Payer: Self-pay | Admitting: Oncology

## 2014-07-06 ENCOUNTER — Other Ambulatory Visit: Payer: Self-pay | Admitting: *Deleted

## 2014-07-07 ENCOUNTER — Telehealth: Payer: Self-pay

## 2014-07-07 MED ORDER — WARFARIN SODIUM 5 MG PO TABS: 5.0000 mg | ORAL_TABLET | Freq: Every day | ORAL | Status: DC

## 2014-07-07 NOTE — Telephone Encounter (Signed)
Amanda Turner called today to request that his wife's (Amanda Turner) Coumadin Clinic appointment on 3/8 be canceled. They recently changed insurance to [facility], and until they receive an approved referral from their primary care, their Coumadin Clinic appointments will not be covered. He stated that they have already made the request for the referral, and are waiting for a response. We will cancel Amanda Turner's upcoming appointment, and Amanda Turner said they will call to reschedule when they have this insurance issue resolved.

## 2014-07-11 ENCOUNTER — Ambulatory Visit: Payer: Commercial Managed Care - HMO

## 2014-07-11 ENCOUNTER — Other Ambulatory Visit: Payer: Commercial Managed Care - HMO

## 2014-07-12 ENCOUNTER — Encounter: Payer: Self-pay | Admitting: Internal Medicine

## 2014-08-01 ENCOUNTER — Telehealth: Payer: Self-pay | Admitting: Pharmacist

## 2014-08-01 NOTE — Telephone Encounter (Signed)
Received call from patient's husband, Elta Guadeloupe, at [facility] in reference to Coumadin Clinic charges from January encounter. Mr Marich stated that they had changed insurance companies in January and that the charge for the patient's INR check performed 05/09/14 was not covered by the new insurance without prior authorization.   I instructed patient's husband that there is a Coumadin Clinic at the [facility] at [facility] where Dr Beryle Beams is now affiliated.   Pt's husband was also instructed to call [facility]'s financial advocates if they wanted to pursue the prior authorization route to try to continue to be seen at [facility].  Currie Paris, PharmD, BCPS

## 2014-08-07 ENCOUNTER — Ambulatory Visit: Payer: Commercial Managed Care - HMO | Admitting: Internal Medicine

## 2014-08-07 ENCOUNTER — Telehealth: Payer: Self-pay | Admitting: Pharmacist

## 2014-08-07 NOTE — Telephone Encounter (Signed)
[facility] am happy to follow [facility] at [facility]  I have no MD spots until June but she can schedule a PT INR any time  [facility]

## 2014-08-07 NOTE — Telephone Encounter (Signed)
I s/w pts husband, Elta Guadeloupe over phone today. Their [employer] has authorized for pt to see Dr. Beryle Beams x 6 visits from 07/2014 - 12/2014 but they've had trouble getting an appt. Elta Guadeloupe does have the number [phone] and he will try again for an appt. Meanwhile, pt is seeing new PCP, Dr. Shawna Orleans at [facility] on 08/09/14 and they will check the INR there that day & dose her Coumadin. Elta Guadeloupe would rather Dr. Beryle Beams manage his wife's INR in the Internal Medicine Clinic. We'll await word from Dr. Beryle Beams whether or not she is going to be seen in his office. Kennith Center, Pharm.D., CPP 08/07/2014@4 :56 PM

## 2014-08-09 ENCOUNTER — Encounter: Payer: Self-pay | Admitting: Internal Medicine

## 2014-08-09 ENCOUNTER — Telehealth: Payer: Self-pay | Admitting: Internal Medicine

## 2014-08-09 ENCOUNTER — Ambulatory Visit (INDEPENDENT_AMBULATORY_CARE_PROVIDER_SITE_OTHER): Payer: Commercial Managed Care - HMO | Admitting: Internal Medicine

## 2014-08-09 VITALS — BP 126/72 | HR 78 | Temp 98.1°F | Ht 64.0 in | Wt 190.0 lb

## 2014-08-09 DIAGNOSIS — D6862 Lupus anticoagulant syndrome: Secondary | ICD-10-CM

## 2014-08-09 DIAGNOSIS — R2681 Unsteadiness on feet: Secondary | ICD-10-CM

## 2014-08-09 DIAGNOSIS — I639 Cerebral infarction, unspecified: Secondary | ICD-10-CM | POA: Diagnosis not present

## 2014-08-09 DIAGNOSIS — I6529 Occlusion and stenosis of unspecified carotid artery: Secondary | ICD-10-CM | POA: Diagnosis not present

## 2014-08-09 DIAGNOSIS — I10 Essential (primary) hypertension: Secondary | ICD-10-CM | POA: Diagnosis not present

## 2014-08-09 DIAGNOSIS — L930 Discoid lupus erythematosus: Secondary | ICD-10-CM

## 2014-08-09 DIAGNOSIS — Z23 Encounter for immunization: Secondary | ICD-10-CM | POA: Diagnosis not present

## 2014-08-09 DIAGNOSIS — E785 Hyperlipidemia, unspecified: Secondary | ICD-10-CM

## 2014-08-09 LAB — URINALYSIS, ROUTINE W REFLEX MICROSCOPIC
BILIRUBIN URINE: NEGATIVE
KETONES UR: NEGATIVE
LEUKOCYTES UA: NEGATIVE
Nitrite: NEGATIVE
Specific Gravity, Urine: 1.01 (ref 1.000–1.030)
Total Protein, Urine: NEGATIVE
URINE GLUCOSE: NEGATIVE
Urobilinogen, UA: 0.2 (ref 0.0–1.0)
pH: 6.5 (ref 5.0–8.0)

## 2014-08-09 LAB — BASIC METABOLIC PANEL
BUN: 16 mg/dL (ref 6–23)
CO2: 29 mEq/L (ref 19–32)
Calcium: 9.5 mg/dL (ref 8.4–10.5)
Chloride: 102 mEq/L (ref 96–112)
Creatinine, Ser: 0.82 mg/dL (ref 0.40–1.20)
GFR: 73.61 mL/min (ref >60.00)
Glucose, Bld: 100 mg/dL — ABNORMAL HIGH (ref 70–99)
Potassium: 3.9 mEq/L (ref 3.5–5.1)
SODIUM: 138 meq/L (ref 135–145)

## 2014-08-09 LAB — CBC WITH DIFFERENTIAL/PLATELET
BASOS ABS: 0 10*3/uL (ref 0.0–0.1)
Basophils Relative: 0.4 % (ref 0.0–3.0)
Eosinophils Absolute: 0.1 10*3/uL (ref 0.0–0.7)
Eosinophils Relative: 1.6 % (ref 0.0–5.0)
HCT: 45 % (ref 36.0–46.0)
Hemoglobin: 15.3 g/dL — ABNORMAL HIGH (ref 12.0–15.0)
LYMPHS PCT: 29.1 % (ref 12.0–46.0)
Lymphs Abs: 1.6 10*3/uL (ref 0.7–4.0)
MCHC: 34.1 g/dL (ref 30.0–36.0)
MCV: 92.4 fl (ref 78.0–100.0)
MONOS PCT: 9.4 % (ref 3.0–12.0)
Monocytes Absolute: 0.5 10*3/uL (ref 0.1–1.0)
Neutro Abs: 3.3 10*3/uL (ref 1.4–7.7)
Neutrophils Relative %: 59.5 % (ref 43.0–77.0)
PLATELETS: 215 10*3/uL (ref 150.0–400.0)
RBC: 4.86 Mil/uL (ref 3.87–5.11)
RDW: 12.5 % (ref 11.5–15.5)
WBC: 5.5 10*3/uL (ref 4.0–10.5)

## 2014-08-09 LAB — POCT INR: INR: 2.7

## 2014-08-09 LAB — SEDIMENTATION RATE: Sed Rate: 22 mm/hr (ref 0–22)

## 2014-08-09 LAB — CK: CK TOTAL: 59 U/L (ref 7–177)

## 2014-08-09 NOTE — Assessment & Plan Note (Signed)
Patient's INR hasn't been checked in 2 months. She denies any abnormal bleeding. Check point-of-care INR. Adjust Coumadin dose accordingly. She will discuss with her hematologist whether we can take over management of anticoagulation. Goal INR between 2 and 3.

## 2014-08-09 NOTE — Assessment & Plan Note (Signed)
Patient's aphasia back to baseline. She has ongoing issues with mild gait instability. Refer to PT.

## 2014-08-09 NOTE — Assessment & Plan Note (Signed)
Patient reports history of mild lupus. She denies history of lupus nephritis. Monitor electrolytes, kidney function and urinalysis. Check ANA, sed rate double-stranded antibody and Smith antibody.

## 2014-08-09 NOTE — Assessment & Plan Note (Signed)
She complains of generalized muscle weakness. Check CK.

## 2014-08-09 NOTE — Progress Notes (Signed)
Subjective:    Patient ID: Amanda Turner, female    DOB: 10-21-1945, 69 y.o.   MRN: [id number]  HPI  69 year old Hispanic female with history of lupus, cerebrovascular accident, and lupus and acquired hypercoagulable state to transfer care. Patient is followed by Dr. Leanne Chang.  Her previous medical records reviewed. She is followed by hematologist-Dr. Beryle Beams. 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 She developed a number of perioperative complications including CSF leak then hemorrhagic infarct and multi focal thrombotic infarction of brain. Patient has been anticoagulated since with warfarin. She is overdue for INR.  She was found to have positive lupus type anticoagulant. Initially she had issues with aphasia and right-sided weakness. Fortunately her speech has returned to normal with speech therapy. She still has slight gait instability.    Patient has history of lupus. She was diagnosed when she was living in [place]. Patient denies any renal involvement with her lupus. She complains of generalized muscle pains.  Patient also has history of carotid stenosis. This was discovered during her workup for stroke.  Her last carotid duplex completed in 2014. It was negative for any hemodynamically stable carotid stenosis.  Review of Systems  Constitutional: Negative for activity change, appetite change and unexpected weight change.  Eyes: Negative for visual disturbance.  Respiratory: Negative for cough, chest tightness and shortness of breath.   Cardiovascular: Negative for chest pain.  Genitourinary: Negative for difficulty urinating.  Neurological: Negative for headaches.  Generalized weakness, gait instability  Gastrointestinal: Negative for abdominal pain, heartburn melena or hematochezia Psych: mild depressive symptoms         Past Medical History  Diagnosis Date  . Depression   . Hyperlipemia   .  Hypertension   . Elevated LFT's   . HA (headache)   . Lupus anticoagulant disorder   . Stroke   . 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 Anxiety   . Intracranial hemorrhage, spontaneous intraparenchymal, associated with coagulopathy, remote, resolved 06/23/2011  . Lupus anticoagulant with hypercoagulable state 06/23/2011  . Lupus erythematosus 06/23/2011    History   Social History  . Marital Status: Married    Spouse Name: N/A  . Number of Children: 2  . Years of Education: N/A   Occupational History  .     Social History Main Topics  . Smoking status: Never Smoker   . Smokeless tobacco: Never Used  . Alcohol Use: No  . Drug Use: No  . Sexual Activity: Not on file   Other Topics Concern  . Not on file   Social History Narrative   2 cups of coffee daily   Retired       Past Surgical History  Procedure Laterality Date  . Abdominal hysterectomy    . Transphenoidal pituitary resection      Family History  Problem Relation Age of Onset  . Diabetes Brother   . Diabetes Sister   . Hypertension Mother   . Colon cancer Neg Hx   . Dementia Mother   . Bladder Cancer Brother     Allergies  Allergen Reactions  . Lisinopril Cough  . Losartan Swelling  . Peanut Oil     REACTION: hives  . Shrimp Flavor     hives  . Sulfonamide Derivatives     REACTION: shortness of breath    Current Outpatient Prescriptions on File Prior to Visit  Medication Sig Dispense Refill  . ALPRAZolam (XANAX) 0.5 MG tablet  TAKE 1 TABLET BY MOUTH EVERY DAY 30 tablet 0  . aspirin 81 MG tablet Take 81 mg by mouth daily.      . bifidobacterium infantis (ALIGN) capsule Take 1 capsule by mouth daily. 7 capsule 0  . citalopram (CELEXA) 20 MG tablet TAKE 1 TABLET (20 MG TOTAL) BY MOUTH DAILY. 30 tablet 0  . famotidine (PEPCID) 20 MG tablet TAKE 1 TABLET BY MOUTH TWICE A DAY 60 tablet 4  . furosemide (LASIX) 40 MG tablet TAKE 1 TABLET (40 MG TOTAL) BY MOUTH  DAILY. 30 tablet 0  . KLOR-CON M20 20 MEQ tablet TAKE 1 TABLET BY MOUTH TWICE A DAY 180 tablet 3  . lisinopril (PRINIVIL,ZESTRIL) 5 MG tablet TAKE 1 TABLET (5 MG TOTAL) BY MOUTH DAILY. 30 tablet 5  . simvastatin (ZOCOR) 20 MG tablet TAKE 1 TABLET BY MOUTH AT BEDTIME 90 tablet 3  . warfarin (COUMADIN) 5 MG tablet Take 1 tablet (5 mg total) by mouth daily. 90 tablet 3   No current facility-administered medications on file prior to visit.    BP 126/72 mmHg  Pulse 78  Temp(Src) 98.1 F (36.7 C) (Oral)  Ht 5\' 4"  (1.626 m)  Wt 190 lb (86.183 kg)  BMI 32.60 kg/m2      Objective:   Physical Exam  Constitutional: She is oriented to person, place, and time. She appears well-developed and well-nourished. No distress.  HENT:  Head: Normocephalic and atraumatic.  Right Ear: External ear normal.  Left Ear: External ear normal.  Mouth/Throat: Oropharynx is clear and moist.  Speech is normal  Eyes: Conjunctivae and EOM are normal. Pupils are equal, round, and reactive to light.  Neck: Neck supple.  No carotid bruit  Cardiovascular: Normal rate and normal heart sounds.   No murmur heard. Pulmonary/Chest: Effort normal. She has no wheezes.  Abdominal: Soft. Bowel sounds are normal. She exhibits no mass. There is no tenderness.  Musculoskeletal:  Trace lower extremity edema bilaterally No joint swelling or edema  Lymphadenopathy:    She has no cervical adenopathy.  Neurological: She is alert and oriented to person, place, and time. She has normal reflexes. She displays normal reflexes. No cranial nerve deficit. She exhibits normal muscle tone.  Mild upper extremity and lower extremity weakness (left hip flexor more than right), negative Romberg, patient able to sense temperature and vibration in her extremities  Skin: Skin is warm and dry. No rash noted.  Psychiatric: She has a normal mood and affect. Her behavior is normal.          Assessment & Plan:

## 2014-08-09 NOTE — Assessment & Plan Note (Addendum)
Stable. Monitor electrolytes and kidney function. BP: 126/72 mmHg

## 2014-08-09 NOTE — Progress Notes (Signed)
Pre visit review using our clinic review tool, if applicable. No additional management support is needed unless otherwise documented below in the visit note. 

## 2014-08-09 NOTE — Telephone Encounter (Signed)
emmi emailed °

## 2014-08-10 LAB — ANTI-SMITH ANTIBODY: ENA SM AB SER-ACNC: NEGATIVE

## 2014-08-10 LAB — ANTI-DNA ANTIBODY, DOUBLE-STRANDED: ds DNA Ab: 1 IU/mL

## 2014-08-10 LAB — ANA: Anti Nuclear Antibody(ANA): NEGATIVE

## 2014-08-11 ENCOUNTER — Telehealth: Payer: Self-pay | Admitting: Pharmacist

## 2014-08-11 ENCOUNTER — Ambulatory Visit (HOSPITAL_COMMUNITY): Payer: Commercial Managed Care - HMO | Attending: Internal Medicine | Admitting: Cardiology

## 2014-08-11 DIAGNOSIS — I6529 Occlusion and stenosis of unspecified carotid artery: Secondary | ICD-10-CM | POA: Diagnosis not present

## 2014-08-11 DIAGNOSIS — R2681 Unsteadiness on feet: Secondary | ICD-10-CM

## 2014-08-11 DIAGNOSIS — I639 Cerebral infarction, unspecified: Secondary | ICD-10-CM

## 2014-08-11 NOTE — Progress Notes (Signed)
Carotid duplex performed 

## 2014-08-11 NOTE — Telephone Encounter (Signed)
I s/w pts husband, Elta Guadeloupe again over phone re: Coumadin management.  He stated they have appt w/ Dr. Beryle Beams on 10/17/14.  They'll go to him from now on for management. Pt had her INR checked on 4/6 = 2.7 at her PCP office visit. We'll archive pt from [facility] Coumadin clinic at this point. Kennith Center, Pharm.D., CPP 08/11/2014@1 :15 PM

## 2014-08-16 ENCOUNTER — Encounter: Payer: Self-pay | Admitting: Internal Medicine

## 2014-09-07 ENCOUNTER — Ambulatory Visit (INDEPENDENT_AMBULATORY_CARE_PROVIDER_SITE_OTHER): Payer: Commercial Managed Care - HMO | Admitting: General Practice

## 2014-09-07 DIAGNOSIS — I639 Cerebral infarction, unspecified: Secondary | ICD-10-CM

## 2014-09-07 LAB — POCT INR: INR: 2.2

## 2014-09-07 NOTE — Progress Notes (Signed)
Pre visit review using our clinic review tool, if applicable. No additional management support is needed unless otherwise documented below in the visit note. 

## 2014-09-15 ENCOUNTER — Telehealth: Payer: Self-pay | Admitting: *Deleted

## 2014-09-15 NOTE — Telephone Encounter (Signed)
Refill xanax 0.5 mg 1 tablet once daily [place]

## 2014-09-18 NOTE — Telephone Encounter (Signed)
Ok to RF x 3 

## 2014-09-20 MED ORDER — ALPRAZOLAM 0.5 MG PO TABS: 0.5000 mg | ORAL_TABLET | Freq: Every day | ORAL | Status: DC

## 2014-09-20 MED ORDER — ALPRAZOLAM 0.5 MG PO TABS
0.5000 mg | ORAL_TABLET | Freq: Every day | ORAL | Status: DC
Start: 2014-09-20 — End: 2015-01-18

## 2014-09-20 NOTE — Telephone Encounter (Signed)
rx was faxed to [facility]

## 2014-09-28 ENCOUNTER — Telehealth: Payer: Self-pay | Admitting: Internal Medicine

## 2014-09-28 ENCOUNTER — Encounter: Payer: Self-pay | Admitting: Internal Medicine

## 2014-09-28 NOTE — Telephone Encounter (Signed)
Pt would like cindy to call her concerning her June appt

## 2014-09-29 NOTE — Telephone Encounter (Signed)
Mychart message sent.

## 2014-10-09 ENCOUNTER — Ambulatory Visit (INDEPENDENT_AMBULATORY_CARE_PROVIDER_SITE_OTHER): Payer: Commercial Managed Care - HMO | Admitting: *Deleted

## 2014-10-09 ENCOUNTER — Ambulatory Visit: Payer: Commercial Managed Care - HMO | Admitting: Internal Medicine

## 2014-10-09 ENCOUNTER — Encounter: Payer: Self-pay | Admitting: Internal Medicine

## 2014-10-09 DIAGNOSIS — I639 Cerebral infarction, unspecified: Secondary | ICD-10-CM | POA: Diagnosis not present

## 2014-10-09 LAB — POCT INR: INR: 1.6

## 2014-10-09 NOTE — Progress Notes (Signed)
Pre visit review using our clinic review tool, if applicable. No additional management support is needed unless otherwise documented below in the visit note. 

## 2014-10-10 ENCOUNTER — Encounter: Payer: Self-pay | Admitting: Oncology

## 2014-10-10 ENCOUNTER — Encounter: Payer: Self-pay | Admitting: Physical Therapy

## 2014-10-17 ENCOUNTER — Ambulatory Visit (INDEPENDENT_AMBULATORY_CARE_PROVIDER_SITE_OTHER): Payer: Commercial Managed Care - HMO | Admitting: Oncology

## 2014-10-17 ENCOUNTER — Encounter: Payer: Self-pay | Admitting: Oncology

## 2014-10-17 VITALS — BP 129/64 | HR 74 | Temp 97.9°F | Ht 64.0 in | Wt 190.3 lb

## 2014-10-17 DIAGNOSIS — L930 Discoid lupus erythematosus: Secondary | ICD-10-CM | POA: Diagnosis not present

## 2014-10-17 DIAGNOSIS — D6862 Lupus anticoagulant syndrome: Secondary | ICD-10-CM | POA: Diagnosis not present

## 2014-10-17 DIAGNOSIS — E785 Hyperlipidemia, unspecified: Secondary | ICD-10-CM

## 2014-10-17 DIAGNOSIS — I10 Essential (primary) hypertension: Secondary | ICD-10-CM

## 2014-10-17 DIAGNOSIS — Z7901 Long term (current) use of anticoagulants: Secondary | ICD-10-CM

## 2014-10-17 DIAGNOSIS — Z7982 Long term (current) use of aspirin: Secondary | ICD-10-CM | POA: Diagnosis not present

## 2014-10-17 DIAGNOSIS — F329 Major depressive disorder, single episode, unspecified: Secondary | ICD-10-CM

## 2014-10-17 NOTE — Progress Notes (Signed)
Patient ID: Amanda Turner, female   DOB: 01-15-1946, 69 y.o.   MRN: [id number] Hematology and Oncology Follow Up Visit  Meron Bocchino [id number] 27-Dec-1945 69 y.o. 10/17/2014 3:22 PM   Principle Diagnosis: Encounter Diagnoses  Name Primary?  . Lupus anticoagulant with hypercoagulable state Yes  . Lupus erythematosus   . Chronic anticoagulation   Clinical Summary: 69 year-old woman with history of lupus. 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 She  developed a number of significant perioperative complications including initial CSF leak then subsequent hemorrhagic infarct and then multifocal thrombotic infarcts in her brain. Decision was made to cautiously anticoagulate her. She was found to have a positive lupus type anticoagulant. She has some mild fixed neurologic deficits since that surgery with partial expressive aphasia which is worse when she gets tired. Mild clumsiness on rapid alternating movements of her left hand which has improved significantly over time.  She continues on chronic full dose Coumadin anticoagulation and the drug is monitored through our office. She is also on 81 mg of aspirin.   Interim History:  She is doing well at this time. She has had no interim medical problems. Her expressive aphasia has almost completely resolved. She only has difficulty finding words if she tries to talk too fast. She denies any change in the pattern of her chronic headaches. No change in vision. No slurred speech. No focal weakness.  Medications: reviewed  Allergies:  Allergies  Allergen Reactions  . Lisinopril Cough  . Losartan Swelling  . Peanut Oil     REACTION: hives  . Shrimp Flavor     hives  . Sulfonamide Derivatives     REACTION: shortness of breath    Review of Systems: See history of present illness  Remaining ROS negative:   Physical Exam: Blood pressure 129/64, pulse 74, temperature 97.9 F (36.6 C),  temperature source Oral, height 5\' 4"  (1.626 m), weight 190 lb 4.8 oz (86.32 kg), SpO2 98 %. Wt Readings from Last 3 Encounters:  10/17/14 190 lb 4.8 oz (86.32 kg)  08/09/14 190 lb (86.183 kg)  11/03/13 204 lb (92.534 kg)     General appearance:  well-nourished Hispanic woman HENNT: Pharynx no erythema, exudate, mass, or ulcer. No thyromegaly or thyroid nodules Lymph nodes: No cervical, supraclavicular, or axillary lymphadenopathy Breasts:  Lungs: Clear to auscultation, resonant to percussion throughout Heart: Regular rhythm, no murmur, no gallop, no rub, no click, no edema Abdomen: Soft, nontender, normal bowel sounds, no mass, no organomegaly Extremities: No edema, no calf tenderness Musculoskeletal: no joint deformities GU:  Vascular: Carotid pulses 2+, no bruits, distal pulses: Dorsalis pedis 1+ symmetric Neurologic: Alert, oriented, PERRLA, optic discs sharp and vessels normal, no hemorrhage or exudate, cranial nerves grossly normal, motor strength 5 over 5, reflexes 1+ symmetric, upper body coordination normal, gait normal, Skin: No rash or ecchymosis  Lab Results: CBC W/Diff    Component Value Date/Time   WBC 5.5 08/09/2014 1418   WBC 5.7 02/08/2013 1455   RBC 4.86 08/09/2014 1418   RBC 4.83 02/08/2013 1455   HGB 15.3* 08/09/2014 1418   HGB 15.0 02/08/2013 1455   HCT 45.0 08/09/2014 1418   HCT 44.7 02/08/2013 1455   PLT 215.0 08/09/2014 1418   PLT 232 02/08/2013 1455   MCV 92.4 08/09/2014 1418   MCV 92.5 02/08/2013 1455   MCH 31.0 02/08/2013 1455   MCH 31.3 05/29/2009 1417   MCHC 34.1 08/09/2014 1418   MCHC 33.5 02/08/2013 1455  RDW 12.5 08/09/2014 1418   RDW 12.6 02/08/2013 1455   LYMPHSABS 1.6 08/09/2014 1418   LYMPHSABS 1.7 02/08/2013 1455   MONOABS 0.5 08/09/2014 1418   MONOABS 0.5 02/08/2013 1455   EOSABS 0.1 08/09/2014 1418   EOSABS 0.1 02/08/2013 1455   BASOSABS 0.0 08/09/2014 1418   BASOSABS 0.1 02/08/2013 1455     Chemistry      Component Value  Date/Time   NA 138 08/09/2014 1418   NA 142 02/08/2013 1455   K 3.9 08/09/2014 1418   K 4.0 02/08/2013 1455   CL 102 08/09/2014 1418   CL 104 07/22/2012 1426   CO2 29 08/09/2014 1418   CO2 26 02/08/2013 1455   BUN 16 08/09/2014 1418   BUN 12.2 02/08/2013 1455   CREATININE 0.82 08/09/2014 1418   CREATININE 0.8 02/08/2013 1455      Component Value Date/Time   CALCIUM 9.5 08/09/2014 1418   CALCIUM 9.2 02/08/2013 1455   ALKPHOS 93 02/08/2013 1455   ALKPHOS 79 01/24/2013 1012   AST 18 02/08/2013 1455   AST 18 01/24/2013 1012   ALT 24 02/08/2013 1455   ALT 24 01/24/2013 1012   BILITOT 0.45 02/08/2013 1455   BILITOT 0.6 01/24/2013 1012       Radiological Studies: No results found.  Impression:  #1. 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   #2 status post resection pituitary adenoma with complications as outlined above.  She remains neurologically stable at this time with mild chronic fixed deficits.   #3. Lupus erythematosus   #4 reactive depression   #5 hyperlipidemia   #6. Essential hypertension. Well controlled on current medicine.  Plan is continue long-term anticoagulation with Coumadin and aspirin. Current Coumadin dose 5 mg daily.  CC: Patient Care Team: Doe-Hyun Kyra Searles, DO as PCP - General (Internal Medicine) Annia Belt, MD as Consulting Physician (Oncology) Garvin Fila, MD as Consulting Physician (Neurology)   Annia Belt, MD 6/14/20163:22 PM

## 2014-10-17 NOTE — Patient Instructions (Signed)
Return visit 4-5 months  Lab day of visit

## 2014-10-18 ENCOUNTER — Ambulatory Visit: Payer: Commercial Managed Care - HMO | Admitting: Physical Therapy

## 2014-10-30 ENCOUNTER — Ambulatory Visit (INDEPENDENT_AMBULATORY_CARE_PROVIDER_SITE_OTHER): Payer: Commercial Managed Care - HMO | Admitting: General Practice

## 2014-10-30 DIAGNOSIS — Z5181 Encounter for therapeutic drug level monitoring: Secondary | ICD-10-CM | POA: Insufficient documentation

## 2014-10-30 DIAGNOSIS — I639 Cerebral infarction, unspecified: Secondary | ICD-10-CM

## 2014-10-30 LAB — POCT INR: INR: 3

## 2014-10-30 NOTE — Progress Notes (Signed)
Pre visit review using our clinic review tool, if applicable. No additional management support is needed unless otherwise documented below in the visit note. 

## 2014-11-16 ENCOUNTER — Encounter: Payer: Self-pay | Admitting: Internal Medicine

## 2014-11-16 ENCOUNTER — Ambulatory Visit (INDEPENDENT_AMBULATORY_CARE_PROVIDER_SITE_OTHER): Payer: Commercial Managed Care - HMO | Admitting: Nurse Practitioner

## 2014-11-16 ENCOUNTER — Encounter: Payer: Self-pay | Admitting: Nurse Practitioner

## 2014-11-16 ENCOUNTER — Ambulatory Visit: Payer: Commercial Managed Care - HMO | Admitting: Nurse Practitioner

## 2014-11-16 VITALS — BP 137/75 | HR 66 | Temp 98.2°F | Resp 16 | Ht 64.0 in | Wt 190.0 lb

## 2014-11-16 DIAGNOSIS — R319 Hematuria, unspecified: Secondary | ICD-10-CM

## 2014-11-16 LAB — POCT URINALYSIS DIPSTICK
Bilirubin, UA: NEGATIVE
Glucose, UA: NEGATIVE
KETONES UA: NEGATIVE
LEUKOCYTES UA: NEGATIVE
NITRITE UA: NEGATIVE
Protein, UA: NEGATIVE
SPEC GRAV UA: 1.015
Urobilinogen, UA: 0.2
pH, UA: 6.5

## 2014-11-16 LAB — CBC
HCT: 44.9 % (ref 36.0–46.0)
Hemoglobin: 15.1 g/dL — ABNORMAL HIGH (ref 12.0–15.0)
MCH: 31 pg (ref 26.0–34.0)
MCHC: 33.6 g/dL (ref 30.0–36.0)
MCV: 92.2 fL (ref 78.0–100.0)
MPV: 11 fL (ref 8.6–12.4)
Platelets: 228 10*3/uL (ref 150–400)
RBC: 4.87 MIL/uL (ref 3.87–5.11)
RDW: 13.2 % (ref 11.5–15.5)
WBC: 5.1 10*3/uL (ref 4.0–10.5)

## 2014-11-16 NOTE — Patient Instructions (Signed)
Skip coumadin dose tonight.  Bleeding can be caused by infection, kidney stones, or from taking coumadin.  My office will call with lab results tomorrow.

## 2014-11-16 NOTE — Progress Notes (Signed)
   Subjective:    Patient ID: Amanda Turner, female    DOB: 10-09-1945, 69 y.o.   MRN: [id number]  HPI Comments: Pt is accompanied by her husband today.  Hematuria This is a new problem. The current episode started in the past 7 days (3d). The problem is unchanged. She describes the hematuria as gross hematuria. She is experiencing no pain. She describes her urine color as dark red. Irritative symptoms do not include frequency or urgency. Obstructive symptoms do not include dribbling, incomplete emptying or a weak stream. Associated symptoms include flank pain (mild, intermittent, bilateral). Pertinent negatives include no abdominal pain, chills, dysuria, fever or nausea. She is sexually active. (SLE, antiphospholipid antibody syndrome, CVA, anticoagluant therapy. Abd CT 05/2014 showed "small bilat" kidney stones)      Review of Systems  Constitutional: Negative for fever and chills.  Gastrointestinal: Negative for nausea and abdominal pain.  Genitourinary: Positive for hematuria and flank pain (mild, intermittent, bilateral). Negative for dysuria, urgency, frequency and incomplete emptying.       Objective:   Physical Exam  Constitutional: She is oriented to person, place, and time. She appears well-developed and well-nourished. No distress.  HENT:  Head: Normocephalic and atraumatic.  Eyes: Conjunctivae are normal. Right eye exhibits no discharge. Left eye exhibits no discharge.  Wearing glasses  Cardiovascular: Normal rate.   Pulmonary/Chest: Effort normal.  Abdominal: Soft. Bowel sounds are normal. She exhibits no distension and no mass. There is no tenderness. There is no rebound and no guarding.  Musculoskeletal: She exhibits tenderness (bilateral).  Neurological: She is alert and oriented to person, place, and time.  Skin: Skin is warm and dry.  Psychiatric: She has a normal mood and affect. Her behavior is normal. Thought content normal.  Vitals reviewed.           Assessment & Plan:  1. Hematuria DD: infection, kidney stone, over- anticoagulated - Urine culture - POCT urinalysis dipstick-large blood, SG 1.015 - CBC - Protime-INR  F/u PRN results

## 2014-11-16 NOTE — Progress Notes (Signed)
Pre visit review using our clinic review tool, if applicable. No additional management support is needed unless otherwise documented below in the visit note. 

## 2014-11-17 ENCOUNTER — Ambulatory Visit: Payer: Commercial Managed Care - HMO | Admitting: Internal Medicine

## 2014-11-17 ENCOUNTER — Telehealth: Payer: Self-pay | Admitting: Nurse Practitioner

## 2014-11-17 LAB — PROTIME-INR
INR: 1.91 — ABNORMAL HIGH (ref <1.50)
PROTHROMBIN TIME: 21.9 s — AB (ref 11.6–15.2)

## 2014-11-17 NOTE — Telephone Encounter (Signed)
Patient aware of results.

## 2014-11-17 NOTE — Telephone Encounter (Signed)
LMOM for pt to CB.  

## 2014-11-17 NOTE — Telephone Encounter (Signed)
pls call pt: Advise Blood in urine is not due to anticoagulation: INR is 1.9. Blood count is normal. Cause could be infection-we will know when culture comes back, OR Could be due to kidney stones.  If she does not have infection, she should follow up w/ DR Shawna Orleans or I will refer to urology for further evaluation. We should have result on Monday or Tuesday.

## 2014-11-18 LAB — URINE CULTURE: Colony Count: 2000

## 2014-11-20 ENCOUNTER — Telehealth: Payer: Self-pay | Admitting: Nurse Practitioner

## 2014-11-20 ENCOUNTER — Telehealth: Payer: Self-pay | Admitting: Internal Medicine

## 2014-11-20 NOTE — Telephone Encounter (Signed)
pls call pt: Advise No UTI. She should f/u w/Dr Shawna Orleans, or I can refer to urology.

## 2014-11-20 NOTE — Telephone Encounter (Signed)
Left pt detailed message as they requested.  Asked them in message to return call to discuss if they would like referral.

## 2014-11-20 NOTE — Telephone Encounter (Signed)
Pts. Husband called and left a message that Blessings will continue to see Dr. Shawna Orleans and will f/u with him.

## 2014-11-21 NOTE — Telephone Encounter (Signed)
Pt will schedule w/ Dr. Shawna Orleans.

## 2014-11-27 ENCOUNTER — Ambulatory Visit (INDEPENDENT_AMBULATORY_CARE_PROVIDER_SITE_OTHER): Payer: Commercial Managed Care - HMO | Admitting: General Practice

## 2014-11-27 DIAGNOSIS — Z5181 Encounter for therapeutic drug level monitoring: Secondary | ICD-10-CM | POA: Diagnosis not present

## 2014-11-27 DIAGNOSIS — I639 Cerebral infarction, unspecified: Secondary | ICD-10-CM

## 2014-11-27 LAB — POCT INR: INR: 2.5

## 2014-11-27 NOTE — Progress Notes (Signed)
Pre visit review using our clinic review tool, if applicable. No additional management support is needed unless otherwise documented below in the visit note. 

## 2014-11-28 ENCOUNTER — Encounter: Payer: Self-pay | Admitting: Oncology

## 2014-12-06 ENCOUNTER — Ambulatory Visit (INDEPENDENT_AMBULATORY_CARE_PROVIDER_SITE_OTHER): Payer: Commercial Managed Care - HMO | Admitting: Internal Medicine

## 2014-12-06 ENCOUNTER — Encounter: Payer: Self-pay | Admitting: Internal Medicine

## 2014-12-06 VITALS — BP 110/80 | HR 66 | Temp 98.1°F | Wt 189.0 lb

## 2014-12-06 DIAGNOSIS — D6862 Lupus anticoagulant syndrome: Secondary | ICD-10-CM

## 2014-12-06 DIAGNOSIS — I10 Essential (primary) hypertension: Secondary | ICD-10-CM

## 2014-12-06 DIAGNOSIS — N200 Calculus of kidney: Secondary | ICD-10-CM

## 2014-12-06 DIAGNOSIS — R319 Hematuria, unspecified: Secondary | ICD-10-CM | POA: Diagnosis not present

## 2014-12-06 NOTE — Progress Notes (Signed)
Subjective:    Patient ID: Amanda Turner, female    DOB: 1945-07-19, 69 y.o.   MRN: [id number]  HPI  69 year old white female with history of hypercoagulable disorder, lupus erythematosus, and chronic anticoagulation for follow-up.  Patient seen by nurse practitioner for gross hematuria.  Patient reports she has had similar episode in the past. Her previous PCP obtained CT of abdomen and pelvis in January 2016 which showed small bilateral kidney stones. No other renal lesions noted. She does not have history of smoking.  Her younger brother has history of bladder cancer.  He was smoker and drank alcohol.  Patient report hematuria resolved once her INR decreased from 3.0 to 1.91.  Patient reports she has passed kidney stones in the past.  Stone analysis never performed.   Review of Systems Negative for hematuria, negative for flank pain    Past Medical History  Diagnosis Date  . Depression   . Hyperlipemia   . Hypertension   . Elevated LFT's   . HA (headache)   . Lupus anticoagulant disorder   . Stroke   . 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 Anxiety   . Intracranial hemorrhage, spontaneous intraparenchymal, associated with coagulopathy, remote, resolved 06/23/2011  . Lupus anticoagulant with hypercoagulable state 06/23/2011  . Lupus erythematosus 06/23/2011    History   Social History  . Marital Status: Married    Spouse Name: N/A  . Number of Children: 2  . Years of Education: N/A   Occupational History  .     Social History Main Topics  . Smoking status: Never Smoker   . Smokeless tobacco: Never Used  . Alcohol Use: No  . Drug Use: No  . Sexual Activity: Not on file   Other Topics Concern  . Not on file   Social History Narrative   2 cups of coffee daily   Retired       Past Surgical History  Procedure Laterality Date  . Abdominal hysterectomy    . Transphenoidal pituitary resection      Family History  Problem  Relation Age of Onset  . Diabetes Brother   . Diabetes Sister   . Hypertension Mother   . Colon cancer Neg Hx   . Dementia Mother   . Bladder Cancer Brother     Allergies  Allergen Reactions  . Lisinopril Cough  . Losartan Swelling  . Peanut Oil     REACTION: hives  . Shrimp Flavor     hives  . Sulfonamide Derivatives     REACTION: shortness of breath    Current Outpatient Prescriptions on File Prior to Visit  Medication Sig Dispense Refill  . ALPRAZolam (XANAX) 0.5 MG tablet Take 1 tablet (0.5 mg total) by mouth daily. 90 tablet 0  . aspirin 81 MG tablet Take 81 mg by mouth daily.      . bifidobacterium infantis (ALIGN) capsule Take 1 capsule by mouth daily. 7 capsule 0  . citalopram (CELEXA) 20 MG tablet TAKE 1 TABLET (20 MG TOTAL) BY MOUTH DAILY. 30 tablet 0  . famotidine (PEPCID) 20 MG tablet TAKE 1 TABLET BY MOUTH TWICE A DAY 60 tablet 4  . furosemide (LASIX) 40 MG tablet TAKE 1 TABLET (40 MG TOTAL) BY MOUTH DAILY. 30 tablet 0  . KLOR-CON M20 20 MEQ tablet TAKE 1 TABLET BY MOUTH TWICE A DAY 180 tablet 3  . lisinopril (PRINIVIL,ZESTRIL) 5 MG tablet TAKE 1 TABLET (5 MG TOTAL) BY MOUTH  DAILY. 30 tablet 5  . simvastatin (ZOCOR) 20 MG tablet TAKE 1 TABLET BY MOUTH AT BEDTIME 90 tablet 3  . warfarin (COUMADIN) 5 MG tablet Take 1 tablet (5 mg total) by mouth daily. 90 tablet 3   No current facility-administered medications on file prior to visit.    BP 110/80 mmHg  Pulse 66  Temp(Src) 98.1 F (36.7 C) (Oral)  Wt 189 lb (85.73 kg)  05/23/2014 CT of Abdomen and Pelvis IMPRESSION: 1. Small bilateral renal calculi. No evidence of ureteral calculus or hydronephrosis. 2. No evidence of renal mass or urothelial lesion. 3. Cholelithiasis without evidence of cholecystitis or biliary dilatation.  Objective:   Physical Exam  Constitutional: She is oriented to person, place, and time. She appears well-developed and well-nourished. No distress.  Cardiovascular: Normal rate,  regular rhythm and normal heart sounds.   No murmur heard. Pulmonary/Chest: Effort normal and breath sounds normal. She has no wheezes.  Abdominal: Soft. Bowel sounds are normal. She exhibits no mass.  Neurological: She is alert and oriented to person, place, and time. No cranial nerve deficit.  Skin: Skin is warm and dry.  Psychiatric: She has a normal mood and affect. Her behavior is normal.        Assessment & Plan:   1. Gross Hematuria 2. Chronic anticoagulation 3. Hypercoagulable state 4. Nephrolithiasis  69 year old white female with gross hematuria while on anticoagulation. Hematuria may be secondary to kidney stones but I suggest referral to urologist for further evaluation.  If she were to require cystoscopy, we would need clearance from her hematologist to hold Coumadin and bridge with heparin therapy.  Patient advised to stay well hydrated.

## 2014-12-06 NOTE — Progress Notes (Signed)
Pre visit review using our clinic review tool, if applicable. No additional management support is needed unless otherwise documented below in the visit note. 

## 2014-12-06 NOTE — Patient Instructions (Signed)
Our office will contact you regarding referral to [facility]

## 2014-12-22 ENCOUNTER — Encounter: Payer: Self-pay | Admitting: Internal Medicine

## 2014-12-24 ENCOUNTER — Encounter: Payer: Self-pay | Admitting: Internal Medicine

## 2014-12-25 ENCOUNTER — Encounter: Payer: Self-pay | Admitting: Internal Medicine

## 2014-12-25 ENCOUNTER — Encounter: Payer: Self-pay | Admitting: Oncology

## 2014-12-25 ENCOUNTER — Ambulatory Visit (INDEPENDENT_AMBULATORY_CARE_PROVIDER_SITE_OTHER): Payer: Commercial Managed Care - HMO | Admitting: General Practice

## 2014-12-25 DIAGNOSIS — Z5181 Encounter for therapeutic drug level monitoring: Secondary | ICD-10-CM

## 2014-12-25 DIAGNOSIS — I639 Cerebral infarction, unspecified: Secondary | ICD-10-CM | POA: Diagnosis not present

## 2014-12-25 LAB — POCT INR: INR: 2

## 2014-12-25 MED ORDER — POTASSIUM CHLORIDE CRYS ER 20 MEQ PO TBCR: 20.0000 meq | EXTENDED_RELEASE_TABLET | Freq: Two times a day (BID) | ORAL | Status: DC

## 2014-12-25 NOTE — Progress Notes (Signed)
Pre visit review using our clinic review tool, if applicable. No additional management support is needed unless otherwise documented below in the visit note. 

## 2014-12-26 ENCOUNTER — Other Ambulatory Visit: Payer: Self-pay | Admitting: Internal Medicine

## 2014-12-26 MED ORDER — ESCITALOPRAM OXALATE 10 MG PO TABS: 10.0000 mg | ORAL_TABLET | Freq: Every day | ORAL | Status: DC

## 2015-01-15 ENCOUNTER — Encounter: Payer: Self-pay | Admitting: Internal Medicine

## 2015-01-18 MED ORDER — ALPRAZOLAM 0.5 MG PO TABS: 0.5000 mg | ORAL_TABLET | Freq: Every day | ORAL | Status: DC

## 2015-01-22 ENCOUNTER — Ambulatory Visit (INDEPENDENT_AMBULATORY_CARE_PROVIDER_SITE_OTHER): Payer: Commercial Managed Care - HMO | Admitting: General Practice

## 2015-01-22 DIAGNOSIS — I639 Cerebral infarction, unspecified: Secondary | ICD-10-CM

## 2015-01-22 DIAGNOSIS — Z5181 Encounter for therapeutic drug level monitoring: Secondary | ICD-10-CM | POA: Diagnosis not present

## 2015-01-22 LAB — POCT INR: INR: 2.3

## 2015-01-22 NOTE — Progress Notes (Signed)
Pre visit review using our clinic review tool, if applicable. No additional management support is needed unless otherwise documented below in the visit note. 

## 2015-02-05 ENCOUNTER — Encounter: Payer: Self-pay | Admitting: Internal Medicine

## 2015-02-08 ENCOUNTER — Encounter: Payer: Self-pay | Admitting: Internal Medicine

## 2015-02-08 ENCOUNTER — Encounter: Payer: Self-pay | Admitting: Oncology

## 2015-02-27 ENCOUNTER — Encounter: Payer: Self-pay | Admitting: Internal Medicine

## 2015-02-27 ENCOUNTER — Ambulatory Visit: Payer: Commercial Managed Care - HMO | Admitting: Oncology

## 2015-03-05 ENCOUNTER — Encounter: Payer: Self-pay | Admitting: Internal Medicine

## 2015-03-05 ENCOUNTER — Ambulatory Visit (INDEPENDENT_AMBULATORY_CARE_PROVIDER_SITE_OTHER): Payer: Commercial Managed Care - HMO | Admitting: General Practice

## 2015-03-05 DIAGNOSIS — I639 Cerebral infarction, unspecified: Secondary | ICD-10-CM

## 2015-03-05 DIAGNOSIS — Z5181 Encounter for therapeutic drug level monitoring: Secondary | ICD-10-CM | POA: Diagnosis not present

## 2015-03-05 LAB — POCT INR: INR: 2.4

## 2015-03-05 NOTE — Progress Notes (Signed)
Pre visit review using our clinic review tool, if applicable. No additional management support is needed unless otherwise documented below in the visit note. 

## 2015-03-05 NOTE — Progress Notes (Signed)
Ok with this plan 

## 2015-03-08 ENCOUNTER — Ambulatory Visit: Payer: Commercial Managed Care - HMO | Admitting: *Deleted

## 2015-03-08 ENCOUNTER — Ambulatory Visit (INDEPENDENT_AMBULATORY_CARE_PROVIDER_SITE_OTHER): Payer: Commercial Managed Care - HMO | Admitting: *Deleted

## 2015-03-08 DIAGNOSIS — Z23 Encounter for immunization: Secondary | ICD-10-CM

## 2015-04-16 ENCOUNTER — Ambulatory Visit: Payer: Commercial Managed Care - HMO

## 2015-04-23 ENCOUNTER — Ambulatory Visit: Payer: Commercial Managed Care - HMO

## 2015-04-23 ENCOUNTER — Ambulatory Visit (INDEPENDENT_AMBULATORY_CARE_PROVIDER_SITE_OTHER): Payer: Commercial Managed Care - HMO | Admitting: General Practice

## 2015-04-23 DIAGNOSIS — Z5181 Encounter for therapeutic drug level monitoring: Secondary | ICD-10-CM | POA: Diagnosis not present

## 2015-04-23 DIAGNOSIS — I639 Cerebral infarction, unspecified: Secondary | ICD-10-CM | POA: Diagnosis not present

## 2015-04-23 LAB — POCT INR: INR: 2

## 2015-04-23 NOTE — Progress Notes (Signed)
Pre visit review using our clinic review tool, if applicable. No additional management support is needed unless otherwise documented below in the visit note. 

## 2015-05-10 ENCOUNTER — Encounter: Payer: Self-pay | Admitting: Internal Medicine

## 2015-05-15 ENCOUNTER — Encounter: Payer: Self-pay | Admitting: Internal Medicine

## 2015-05-18 MED ORDER — LISINOPRIL 5 MG PO TABS: ORAL_TABLET | ORAL | Status: DC

## 2015-05-18 MED ORDER — ALPRAZOLAM 0.5 MG PO TABS: 0.5000 mg | ORAL_TABLET | Freq: Every day | ORAL | Status: DC

## 2015-05-18 MED ORDER — FUROSEMIDE 40 MG PO TABS: ORAL_TABLET | ORAL | Status: DC

## 2015-05-18 MED ORDER — POTASSIUM CHLORIDE CRYS ER 20 MEQ PO TBCR: 20.0000 meq | EXTENDED_RELEASE_TABLET | Freq: Two times a day (BID) | ORAL | Status: DC

## 2015-05-18 MED ORDER — SIMVASTATIN 20 MG PO TABS: 20.0000 mg | ORAL_TABLET | Freq: Every day | ORAL | Status: DC

## 2015-05-18 MED ORDER — ESCITALOPRAM OXALATE 10 MG PO TABS: 10.0000 mg | ORAL_TABLET | Freq: Every day | ORAL | Status: DC

## 2015-05-28 ENCOUNTER — Encounter: Payer: Self-pay | Admitting: Internal Medicine

## 2015-05-29 ENCOUNTER — Other Ambulatory Visit: Payer: Self-pay | Admitting: General Practice

## 2015-05-29 ENCOUNTER — Encounter: Payer: Self-pay | Admitting: Internal Medicine

## 2015-05-29 MED ORDER — CITALOPRAM HYDROBROMIDE 10 MG PO TABS
10.0000 mg | ORAL_TABLET | Freq: Every day | ORAL | Status: DC
Start: 2015-05-29 — End: 2015-05-30

## 2015-05-29 MED ORDER — WARFARIN SODIUM 5 MG PO TABS: 5.0000 mg | ORAL_TABLET | Freq: Every day | ORAL | Status: DC

## 2015-05-30 MED ORDER — FUROSEMIDE 40 MG PO TABS: ORAL_TABLET | ORAL | Status: DC

## 2015-05-30 MED ORDER — LISINOPRIL 5 MG PO TABS: ORAL_TABLET | ORAL | Status: DC

## 2015-05-30 MED ORDER — SIMVASTATIN 20 MG PO TABS: 20.0000 mg | ORAL_TABLET | Freq: Every day | ORAL | Status: DC

## 2015-05-30 MED ORDER — ALPRAZOLAM 0.5 MG PO TABS: 0.5000 mg | ORAL_TABLET | Freq: Every day | ORAL | Status: DC

## 2015-05-30 MED ORDER — CITALOPRAM HYDROBROMIDE 10 MG PO TABS: 10.0000 mg | ORAL_TABLET | Freq: Every day | ORAL | Status: DC

## 2015-05-30 MED ORDER — POTASSIUM CHLORIDE CRYS ER 20 MEQ PO TBCR: 20.0000 meq | EXTENDED_RELEASE_TABLET | Freq: Two times a day (BID) | ORAL | Status: DC

## 2015-05-31 ENCOUNTER — Other Ambulatory Visit: Payer: Self-pay

## 2015-05-31 DIAGNOSIS — Z1231 Encounter for screening mammogram for malignant neoplasm of breast: Secondary | ICD-10-CM

## 2015-06-04 ENCOUNTER — Encounter: Payer: Self-pay | Admitting: Internal Medicine

## 2015-06-05 MED ORDER — CITALOPRAM HYDROBROMIDE 10 MG PO TABS: 10.0000 mg | ORAL_TABLET | Freq: Every day | ORAL | Status: DC

## 2015-06-07 ENCOUNTER — Ambulatory Visit: Payer: Commercial Managed Care - HMO

## 2015-06-11 ENCOUNTER — Ambulatory Visit (INDEPENDENT_AMBULATORY_CARE_PROVIDER_SITE_OTHER): Payer: Medicare Other | Admitting: General Practice

## 2015-06-11 DIAGNOSIS — Z5181 Encounter for therapeutic drug level monitoring: Secondary | ICD-10-CM

## 2015-06-11 DIAGNOSIS — I639 Cerebral infarction, unspecified: Secondary | ICD-10-CM

## 2015-06-11 LAB — POCT INR: INR: 2

## 2015-06-11 NOTE — Progress Notes (Signed)
Pre visit review using our clinic review tool, if applicable. No additional management support is needed unless otherwise documented below in the visit note. 

## 2015-06-11 NOTE — Progress Notes (Signed)
I agree with this plan.

## 2015-06-20 ENCOUNTER — Ambulatory Visit
Admission: RE | Admit: 2015-06-20 | Discharge: 2015-06-20 | Disposition: A | Discharge status: Home / Self Care | Payer: Medicare Other | Source: Ambulatory Visit

## 2015-06-20 DIAGNOSIS — Z1231 Encounter for screening mammogram for malignant neoplasm of breast: Secondary | ICD-10-CM

## 2015-07-19 ENCOUNTER — Encounter: Payer: Self-pay | Admitting: Internal Medicine

## 2015-07-23 ENCOUNTER — Ambulatory Visit: Payer: Medicare Other

## 2015-07-25 ENCOUNTER — Ambulatory Visit (INDEPENDENT_AMBULATORY_CARE_PROVIDER_SITE_OTHER): Payer: Medicare Other | Admitting: General Practice

## 2015-07-25 DIAGNOSIS — I639 Cerebral infarction, unspecified: Secondary | ICD-10-CM

## 2015-07-25 DIAGNOSIS — Z5181 Encounter for therapeutic drug level monitoring: Secondary | ICD-10-CM

## 2015-07-25 LAB — POCT INR: INR: 2.2

## 2015-07-25 NOTE — Progress Notes (Signed)
I agree with this plan.

## 2015-07-25 NOTE — Progress Notes (Signed)
Pre visit review using our clinic review tool, if applicable. No additional management support is needed unless otherwise documented below in the visit note. 

## 2015-08-09 ENCOUNTER — Encounter: Payer: Self-pay | Admitting: Internal Medicine

## 2015-08-13 ENCOUNTER — Encounter: Payer: Self-pay | Admitting: Internal Medicine

## 2015-09-03 ENCOUNTER — Encounter: Payer: Self-pay | Admitting: Internal Medicine

## 2015-09-04 ENCOUNTER — Encounter: Payer: Self-pay | Admitting: Internal Medicine

## 2015-09-04 MED ORDER — CITALOPRAM HYDROBROMIDE 10 MG PO TABS: 10.0000 mg | ORAL_TABLET | Freq: Every day | ORAL | Status: DC

## 2015-09-05 ENCOUNTER — Ambulatory Visit: Payer: Medicare Other | Admitting: General Practice

## 2015-09-05 ENCOUNTER — Other Ambulatory Visit: Payer: Self-pay | Admitting: General Practice

## 2015-09-05 DIAGNOSIS — Z5181 Encounter for therapeutic drug level monitoring: Secondary | ICD-10-CM

## 2015-09-05 DIAGNOSIS — I639 Cerebral infarction, unspecified: Secondary | ICD-10-CM

## 2015-09-05 LAB — POCT INR: INR: 2.2

## 2015-09-05 MED ORDER — WARFARIN SODIUM 5 MG PO TABS: 5.0000 mg | ORAL_TABLET | Freq: Every day | ORAL | Status: DC

## 2015-09-05 NOTE — Progress Notes (Signed)
I agree with this plan.

## 2015-09-05 NOTE — Progress Notes (Signed)
Pre visit review using our clinic review tool, if applicable. No additional management support is needed unless otherwise documented below in the visit note. 

## 2015-09-11 ENCOUNTER — Encounter: Payer: Self-pay | Admitting: Internal Medicine

## 2015-09-13 ENCOUNTER — Encounter: Payer: Self-pay | Admitting: Internal Medicine

## 2015-09-13 NOTE — Telephone Encounter (Signed)
Message sent to Dr Quay Burow via patients husbands chart since he is the patient requesting.

## 2015-10-03 ENCOUNTER — Encounter: Payer: Self-pay | Admitting: Internal Medicine

## 2015-10-04 NOTE — Telephone Encounter (Signed)
Pt has new pt appointment with Dr Marzetta Board burns on 6/20.17. OK to tell pt he needs to see someone for that refill? Pt last seen 12/2014

## 2015-10-04 NOTE — Telephone Encounter (Signed)
Please call pt and schedule an appt with new provider was Dr. Shawna Orleans pt. Pt sent my chart message regarding swelling needs appt.

## 2015-10-04 NOTE — Telephone Encounter (Signed)
Left message on voice mail  to call back

## 2015-10-04 NOTE — Telephone Encounter (Signed)
Pt will wait until she sees Dr Quay Burow for any refills.

## 2015-10-04 NOTE — Telephone Encounter (Signed)
Amanda Turner, please tell pt he will need to see his new provider, can not give advise about swelling and increasing medicine without being seen.

## 2015-10-17 ENCOUNTER — Ambulatory Visit (INDEPENDENT_AMBULATORY_CARE_PROVIDER_SITE_OTHER): Payer: Medicare Other | Admitting: General Practice

## 2015-10-17 DIAGNOSIS — I639 Cerebral infarction, unspecified: Secondary | ICD-10-CM

## 2015-10-17 DIAGNOSIS — Z5181 Encounter for therapeutic drug level monitoring: Secondary | ICD-10-CM

## 2015-10-17 LAB — POCT INR: INR: 2

## 2015-10-17 NOTE — Progress Notes (Signed)
Pre visit review using our clinic review tool, if applicable. No additional management support is needed unless otherwise documented below in the visit note. 

## 2015-10-20 NOTE — Progress Notes (Signed)
I agree with this plan.

## 2015-10-23 ENCOUNTER — Encounter: Payer: Self-pay | Admitting: Internal Medicine

## 2015-10-23 ENCOUNTER — Other Ambulatory Visit: Payer: Self-pay | Admitting: Internal Medicine

## 2015-10-23 ENCOUNTER — Ambulatory Visit (INDEPENDENT_AMBULATORY_CARE_PROVIDER_SITE_OTHER): Payer: Medicare Other | Admitting: Internal Medicine

## 2015-10-23 ENCOUNTER — Other Ambulatory Visit (INDEPENDENT_AMBULATORY_CARE_PROVIDER_SITE_OTHER): Payer: Medicare Other

## 2015-10-23 VITALS — BP 116/82 | HR 60 | Temp 97.9°F | Resp 16 | Wt 194.0 lb

## 2015-10-23 DIAGNOSIS — I6310 Cerebral infarction due to embolism of unspecified precerebral artery: Secondary | ICD-10-CM | POA: Diagnosis not present

## 2015-10-23 DIAGNOSIS — F329 Major depressive disorder, single episode, unspecified: Secondary | ICD-10-CM | POA: Diagnosis not present

## 2015-10-23 DIAGNOSIS — R319 Hematuria, unspecified: Secondary | ICD-10-CM

## 2015-10-23 DIAGNOSIS — E785 Hyperlipidemia, unspecified: Secondary | ICD-10-CM

## 2015-10-23 DIAGNOSIS — R609 Edema, unspecified: Secondary | ICD-10-CM | POA: Insufficient documentation

## 2015-10-23 DIAGNOSIS — Z1159 Encounter for screening for other viral diseases: Secondary | ICD-10-CM

## 2015-10-23 DIAGNOSIS — Z86011 Personal history of benign neoplasm of the brain: Secondary | ICD-10-CM

## 2015-10-23 DIAGNOSIS — L930 Discoid lupus erythematosus: Secondary | ICD-10-CM

## 2015-10-23 DIAGNOSIS — I10 Essential (primary) hypertension: Secondary | ICD-10-CM

## 2015-10-23 DIAGNOSIS — R2681 Unsteadiness on feet: Secondary | ICD-10-CM | POA: Insufficient documentation

## 2015-10-23 DIAGNOSIS — F419 Anxiety disorder, unspecified: Secondary | ICD-10-CM

## 2015-10-23 DIAGNOSIS — R739 Hyperglycemia, unspecified: Secondary | ICD-10-CM | POA: Insufficient documentation

## 2015-10-23 DIAGNOSIS — F32A Depression, unspecified: Secondary | ICD-10-CM

## 2015-10-23 DIAGNOSIS — Z8603 Personal history of neoplasm of uncertain behavior: Secondary | ICD-10-CM

## 2015-10-23 LAB — CBC WITH DIFFERENTIAL/PLATELET
Basophils Absolute: 0 10*3/uL (ref 0.0–0.1)
Basophils Relative: 0.5 % (ref 0.0–3.0)
EOS PCT: 1.6 % (ref 0.0–5.0)
Eosinophils Absolute: 0.1 10*3/uL (ref 0.0–0.7)
HEMATOCRIT: 44.7 % (ref 36.0–46.0)
Hemoglobin: 15.1 g/dL — ABNORMAL HIGH (ref 12.0–15.0)
LYMPHS ABS: 2.1 10*3/uL (ref 0.7–4.0)
LYMPHS PCT: 34.9 % (ref 12.0–46.0)
MCHC: 33.8 g/dL (ref 30.0–36.0)
MCV: 92.5 fl (ref 78.0–100.0)
MONOS PCT: 10.7 % (ref 3.0–12.0)
Monocytes Absolute: 0.6 10*3/uL (ref 0.1–1.0)
NEUTROS PCT: 52.3 % (ref 43.0–77.0)
Neutro Abs: 3.1 10*3/uL (ref 1.4–7.7)
Platelets: 202 10*3/uL (ref 150.0–400.0)
RBC: 4.83 Mil/uL (ref 3.87–5.11)
RDW: 12.8 % (ref 11.5–15.5)
WBC: 5.9 10*3/uL (ref 4.0–10.5)

## 2015-10-23 LAB — COMPREHENSIVE METABOLIC PANEL
ALK PHOS: 81 U/L (ref 39–117)
ALT: 24 U/L (ref 0–35)
AST: 17 U/L (ref 0–37)
Albumin: 4.3 g/dL (ref 3.5–5.2)
BUN: 15 mg/dL (ref 6–23)
CO2: 28 meq/L (ref 19–32)
Calcium: 9.1 mg/dL (ref 8.4–10.5)
Chloride: 104 mEq/L (ref 96–112)
Creatinine, Ser: 0.75 mg/dL (ref 0.40–1.20)
GFR: 81.31 mL/min (ref >60.00)
GLUCOSE: 98 mg/dL (ref 70–99)
POTASSIUM: 3.7 meq/L (ref 3.5–5.1)
Sodium: 140 mEq/L (ref 135–145)
TOTAL PROTEIN: 7.4 g/dL (ref 6.0–8.3)
Total Bilirubin: 0.5 mg/dL (ref 0.2–1.2)

## 2015-10-23 LAB — LIPID PANEL
CHOL/HDL RATIO: 3
Cholesterol: 183 mg/dL (ref 0–200)
HDL: 71.7 mg/dL (ref >39.00)
LDL CALC: 86 mg/dL (ref 0–99)
NONHDL: 111.13
Triglycerides: 125 mg/dL (ref 0.0–149.0)
VLDL: 25 mg/dL (ref 0.0–40.0)

## 2015-10-23 LAB — TSH: TSH: 0.82 u[IU]/mL (ref 0.35–4.50)

## 2015-10-23 LAB — HEMOGLOBIN A1C: HEMOGLOBIN A1C: 5.2 % (ref 4.6–6.5)

## 2015-10-23 MED ORDER — FUROSEMIDE 40 MG PO TABS: ORAL_TABLET | ORAL | Status: DC

## 2015-10-23 MED ORDER — CITALOPRAM HYDROBROMIDE 20 MG PO TABS: 20.0000 mg | ORAL_TABLET | Freq: Every day | ORAL | Status: DC

## 2015-10-23 MED ORDER — POTASSIUM CHLORIDE CRYS ER 20 MEQ PO TBCR: 20.0000 meq | EXTENDED_RELEASE_TABLET | Freq: Two times a day (BID) | ORAL | Status: DC

## 2015-10-23 MED ORDER — SIMVASTATIN 20 MG PO TABS: 20.0000 mg | ORAL_TABLET | Freq: Every day | ORAL | Status: DC

## 2015-10-23 MED ORDER — LISINOPRIL 5 MG PO TABS: ORAL_TABLET | ORAL | Status: DC

## 2015-10-23 MED ORDER — ALPRAZOLAM 0.5 MG PO TABS: ORAL_TABLET | ORAL | Status: DC

## 2015-10-23 NOTE — Assessment & Plan Note (Signed)
Denies falls

## 2015-10-23 NOTE — Assessment & Plan Note (Signed)
Has episodes were she appears out of it lasting 2-3 minutes - also affects speech Interested in seeing neuro Will refer

## 2015-10-23 NOTE — Assessment & Plan Note (Signed)
Controlled, stable Continue current dose of medication Will continue

## 2015-10-23 NOTE — Patient Instructions (Addendum)
  Test(s) ordered today. Your results will be released to [place] (or called to you) after review, usually within 72hours after test completion. If any changes need to be made, you will be notified at that same time.  All other Health Maintenance issues reviewed.   All recommended immunizations and age-appropriate screenings are up-to-date or discussed.  No immunizations administered today.   Medications reviewed and updated.  No changes recommended at this time.  Your prescription(s) have been submitted to your pharmacy. Please take as directed and contact our office if you believe you are having problem(s) with the medication(s).  A referral was ordered for neurology  Please followup in 3 months

## 2015-10-23 NOTE — Assessment & Plan Note (Signed)
Legs, sometimes hands and face Taking lasix 40 mg daily - would like to increase dose Check cmp Discussed trying compression socks for leg swelling

## 2015-10-23 NOTE — Assessment & Plan Note (Addendum)
History of mild lupus Not following with rheumatology No active symptoms per pt

## 2015-10-23 NOTE — Progress Notes (Signed)
Subjective:    Patient ID: Amanda Turner, female    DOB: 12/11/1945, 70 y.o.   MRN: [id number]  HPI She is here to establish with a new pcp.  She is here for follow up.  Hypertension: She is taking her medication daily. She is compliant with a low sodium diet.  She denies chest pain, palpitations, edema, shortness of breath and regular headaches. She is not  exercising regularly, except walking around [employer] three times a week.  She does not monitor her blood pressure at home.    Hyperlipidemia: She is taking her medication daily. She is compliant with a low fat/cholesterol diet. She is not exercising regularly. She denies myalgias.   Depression: She is taking her medication daily as prescribed. She denies any side effects from the medication. She feels her depression is well controlled and she is happy with her current dose of medication.   Anxiety: She is taking her medication daily as prescribed. She denies any side effects from the medication. She feels her anxiety is well controlled and she is happy with her current dose of medication.   Swelling:  She gets swelling in her feet and it is worse in the summer.  She also gets swelling in her hands and face.  She is taking the lasix daily.  She wonders if the dose can be increased.  She has never tried compression stockings.   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  On occasional she will be "out of it" and that will last 2-3 minutes. Her speech is not as good initially and it will go back to normal.  It usually occurs in the evening.    Medications and allergies reviewed with patient and updated if appropriate.  Patient Active Problem List   Diagnosis Date Noted  . Encounter for therapeutic drug monitoring 10/30/2014  . Carotid stenosis 09/06/2012  . Metabolic syndrome [id number]  . Intracranial hemorrhage, spontaneous intraparenchymal, associated with coagulopathy, remote, resolved 06/23/2011    . Lupus anticoagulant with hypercoagulable state ([place]) 06/23/2011  . Lupus erythematosus 06/23/2011  . CVA (cerebrovascular accident) ([place]) 03/06/2011  . Hematuria 02/11/2010  . D/O OPTIC CHIASM ASSOC W/PITUITARY NEOPLASMS&D/O 12/22/2008  . CRANIOPHARYNGIOMA 12/20/2008  . Hyperlipidemia 12/04/2006  . DEPRESSION 12/04/2006  . Essential hypertension 12/04/2006  . LIVER FUNCTION TESTS, ABNORMAL 12/04/2006    Current Outpatient Prescriptions on File Prior to Visit  Medication Sig Dispense Refill  . ALPRAZolam (XANAX) 0.5 MG tablet Take 1 tablet (0.5 mg total) by mouth daily. 90 tablet 1  . aspirin 81 MG tablet Take 81 mg by mouth daily.      . bifidobacterium infantis (ALIGN) capsule Take 1 capsule by mouth daily. 7 capsule 0  . furosemide (LASIX) 40 MG tablet TAKE 1 TABLET (40 MG TOTAL) BY MOUTH DAILY. 90 tablet 1  . lisinopril (PRINIVIL,ZESTRIL) 5 MG tablet TAKE 1 TABLET (5 MG TOTAL) BY MOUTH DAILY. 90 tablet 1  . potassium chloride SA (KLOR-CON M20) 20 MEQ tablet Take 1 tablet (20 mEq total) by mouth 2 (two) times daily. 180 tablet 1  . simvastatin (ZOCOR) 20 MG tablet Take 1 tablet (20 mg total) by mouth at bedtime. 90 tablet 1  . warfarin (COUMADIN) 5 MG tablet Take 1 tablet (5 mg total) by mouth daily. 90 tablet 1   No current facility-administered medications on file prior to visit.    Past Medical History  Diagnosis Date  . Depression   . Hyperlipemia   .  Hypertension   . Elevated LFT's   . HA (headache)   . Lupus anticoagulant disorder   . Stroke   . 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 Anxiety   . Intracranial hemorrhage, spontaneous intraparenchymal, associated with coagulopathy, remote, resolved 06/23/2011  . Lupus anticoagulant with hypercoagulable state 06/23/2011  . Lupus erythematosus 06/23/2011    Past Surgical History  Procedure Laterality Date  . Abdominal hysterectomy    . Transphenoidal pituitary resection      Social  History   Social History  . Marital Status: Married    Spouse Name: N/A  . Number of Children: 2  . Years of Education: N/A   Occupational History  .     Social History Main Topics  . Smoking status: Never Smoker   . Smokeless tobacco: Never Used  . Alcohol Use: No  . Drug Use: No  . Sexual Activity: Not on file   Other Topics Concern  . Not on file   Social History Narrative   2 cups of coffee daily   Retired       Family History  Problem Relation Age of Onset  . Diabetes Brother   . Diabetes Sister   . Hypertension Mother   . Colon cancer Neg Hx   . Dementia Mother   . Bladder Cancer Brother     Review of Systems  Constitutional: Negative for fever and chills.  Respiratory: Negative for cough, shortness of breath and wheezing.   Cardiovascular: Positive for leg swelling. Negative for chest pain and palpitations.  Gastrointestinal: Negative for nausea, abdominal pain, diarrhea, constipation and blood in stool.       No gerd  Musculoskeletal: Positive for gait problem.  Neurological: Positive for weakness (sometimes RLE), numbness (numbness/tingling in hands and arms since stroke - intermittent) and headaches (daily, sometimes takes tylenol or asa). Negative for dizziness and light-headedness.       Objective:   Filed Vitals:   10/23/15 1501  BP: 116/82  Pulse: 60  Temp: 97.9 F (36.6 C)  Resp: 16   Filed Weights   10/23/15 1501  Weight: 194 lb (87.998 kg)   Body mass index is 33.28 kg/(m^2).   Physical Exam Constitutional: Appears well-developed and well-nourished. No distress.  Neck: Neck supple. No tracheal deviation present. No thyromegaly present.  No carotid bruit. No cervical adenopathy.   Cardiovascular: Normal rate, regular rhythm and normal heart sounds.   No murmur heard.  No edema Pulmonary/Chest: Effort normal and breath sounds normal. No respiratory distress. No wheezes.       Assessment & Plan:    See Problem List for  Assessment and Plan of chronic medical problems.  F/u in 3 months

## 2015-10-23 NOTE — Assessment & Plan Note (Signed)
a1c

## 2015-10-23 NOTE — Assessment & Plan Note (Signed)
Controlled, stable Continue current dose of medication  

## 2015-10-23 NOTE — Assessment & Plan Note (Signed)
BP Readings from Last 3 Encounters:  10/23/15 116/82  12/06/14 110/80  11/16/14 137/75   BP well controlled Current regimen effective and well tolerated Continue current medications at current doses cmp

## 2015-10-23 NOTE — Assessment & Plan Note (Signed)
Check lipid panel Taking simvastatin daily

## 2015-10-24 LAB — HEPATITIS C ANTIBODY: HCV Ab: NEGATIVE

## 2015-10-25 ENCOUNTER — Encounter: Payer: Self-pay | Admitting: Internal Medicine

## 2015-11-05 ENCOUNTER — Encounter: Payer: Self-pay | Admitting: Internal Medicine

## 2015-11-12 ENCOUNTER — Encounter: Payer: Self-pay | Admitting: Internal Medicine

## 2015-11-28 ENCOUNTER — Ambulatory Visit (INDEPENDENT_AMBULATORY_CARE_PROVIDER_SITE_OTHER): Payer: Medicare Other | Admitting: General Practice

## 2015-11-28 DIAGNOSIS — Z5181 Encounter for therapeutic drug level monitoring: Secondary | ICD-10-CM | POA: Diagnosis not present

## 2015-11-28 LAB — POCT INR: INR: 2.1

## 2015-11-28 NOTE — Progress Notes (Signed)
I agree with this plan.

## 2015-12-05 ENCOUNTER — Encounter: Payer: Self-pay | Admitting: Internal Medicine

## 2015-12-05 ENCOUNTER — Ambulatory Visit (INDEPENDENT_AMBULATORY_CARE_PROVIDER_SITE_OTHER): Payer: Medicare Other | Admitting: Internal Medicine

## 2015-12-05 VITALS — BP 140/70 | Temp 98.6°F | Wt 196.2 lb

## 2015-12-05 DIAGNOSIS — L930 Discoid lupus erythematosus: Secondary | ICD-10-CM | POA: Diagnosis not present

## 2015-12-05 DIAGNOSIS — I10 Essential (primary) hypertension: Secondary | ICD-10-CM | POA: Diagnosis not present

## 2015-12-05 DIAGNOSIS — H6983 Other specified disorders of Eustachian tube, bilateral: Secondary | ICD-10-CM

## 2015-12-05 DIAGNOSIS — R21 Rash and other nonspecific skin eruption: Secondary | ICD-10-CM | POA: Diagnosis not present

## 2015-12-05 MED ORDER — KETOCONAZOLE 2 % EX CREA: 1.0000 "application " | TOPICAL_CREAM | Freq: Two times a day (BID) | CUTANEOUS | 1 refills | Status: DC

## 2015-12-05 NOTE — Progress Notes (Signed)
Pre visit review using our clinic review tool, if applicable. No additional management support is needed unless otherwise documented below in the visit note. 

## 2015-12-05 NOTE — Patient Instructions (Signed)
Your ears do not look infected but they do look like they could have clear fluid in them. You can try generic Allegra or Claritin to see if that helps. These are less sedating and Benadryl. Uncertain cause of the rash some of that because it is in a line and crease if neck  we can treat it or yeast. This is because it didn't respond to cortisone or the topical antibiotic you used. It doesn't look like a spider bite and is not shingles. Try the cream twice a day for 10-14 days if still not improved need recheck at her primary office. If you get a fever or swelling contact earlier.

## 2015-12-05 NOTE — Progress Notes (Signed)
Chief Complaint  Patient presents with  . Rash    HPI: Amanda Turner 70 y.o.   sda pcp NA  Onset about 8- 10 dayus ago of rash on right neck and chest area  That is slowly expanding   has used  HCAS, neosporin and no help  Not that itchy more stinging  also's ear feels full some discomfort  No fever cp sob  ROS: See pertinent positives and negatives per HPI. No blisters vesicles   Doesn't feel that well but no new topicals  Has some itching and behind ear  No hx of same rash  Has ht and  Up today   Past Medical History:  Diagnosis Date  . Anxiety   . 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 Depression   . Elevated LFT's   . HA (headache)   . Hyperlipemia   . Hypertension   . Intracranial hemorrhage, spontaneous intraparenchymal, associated with coagulopathy, remote, resolved 06/23/2011  . Lupus anticoagulant disorder ([place])   . Lupus anticoagulant with hypercoagulable state ([place]) 06/23/2011  . Lupus erythematosus 06/23/2011  . Stroke [facility])     Family History  Problem Relation Age of Onset  . Diabetes Brother   . Diabetes Sister   . Hypertension Mother   . Colon cancer Neg Hx   . Dementia Mother   . Bladder Cancer Brother     Social History   Social History  . Marital status: Married    Spouse name: N/A  . Number of children: 2  . Years of education: N/A   Occupational History  .  Unemployed   Social History Main Topics  . Smoking status: Never Smoker  . Smokeless tobacco: Never Used  . Alcohol use No  . Drug use: No  . Sexual activity: Not Asked   Other Topics Concern  . None   Social History Narrative   2 cups of coffee daily   Retired       Outpatient Medications Prior to Visit  Medication Sig Dispense Refill  . ALPRAZolam (XANAX) 0.5 MG tablet Take 0.5 tab in morning and one tab in evening 135 tablet 1  . aspirin 81 MG tablet Take 81 mg by mouth daily.      . bifidobacterium infantis (ALIGN) capsule Take 1  capsule by mouth daily. 7 capsule 0  . citalopram (CELEXA) 20 MG tablet Take 1 tablet (20 mg total) by mouth daily. 90 tablet 1  . furosemide (LASIX) 40 MG tablet TAKE 1 TABLET (40 MG TOTAL) BY MOUTH DAILY. 90 tablet 1  . lisinopril (PRINIVIL,ZESTRIL) 5 MG tablet TAKE 1 TABLET (5 MG TOTAL) BY MOUTH DAILY. 90 tablet 3  . potassium chloride SA (KLOR-CON M20) 20 MEQ tablet Take 1 tablet (20 mEq total) by mouth 2 (two) times daily. 180 tablet 3  . simvastatin (ZOCOR) 20 MG tablet Take 1 tablet (20 mg total) by mouth at bedtime. 90 tablet 3  . warfarin (COUMADIN) 5 MG tablet Take 1 tablet (5 mg total) by mouth daily. 90 tablet 1   No facility-administered medications prior to visit.      EXAM:  BP 140/70 (BP Location: Right Arm, Patient Position: Sitting, Cuff Size: Large)   Temp 98.6 F (37 C) (Oral)   Wt 196 lb 3.2 oz (89 kg)   BMI 33.68 kg/m   Body mass index is 33.68 kg/m.  GENERAL: vitals reviewed and listed above, alert, oriented, appears well hydrated and in no  acute distress HEENT: atraumatic, conjunctiva  clear, no obvious abnormalities on inspection of external nose and ears   scal behind right ear  Right nl left translucent ? Clear to coudy fluid but has light reflex noted OP : no lesion edema or exudate  NECK: no obvious masses on inspection palpation  LUNGS: clear to auscultation bilaterally, no wheezes, rales or rhonchi, good air movement SKIN nl color   Patches of falt  Pink red  With some ? colorette scaling  On anterior chest   Right neck with 3-4 cm red area  Tracking along crease of neck no ulcers or dc  Appears  Dry  No psoriasis  Plaque  Scalp appears clear CV: HRRR, no clubbing cyanosis or  peripheral edema nl cap refill  MS: moves all extremities ambulatory  PSYCH: pleasant and cooperative, no obvious depression or anxiety  ASSESSMENT AND PLAN:  Discussed the following assessment and plan:  Rash, skin - uncertain etiology see texts  no systemic sx except ear  congesetion feeling no response topical steroid and antibitioc trial emperic nizoral .  Eustachian tube dysfunction, bilateral ?  Lupus erythematosus  Essential hypertension   Expectant management. And fu pcp or derm   -Patient advised to return or notify health care team  if symptoms worsen ,persist or new concerns arise.  Patient Instructions  Your ears do not look infected but they do look like they could have clear fluid in them. You can try generic Allegra or Claritin to see if that helps. These are less sedating and Benadryl. Uncertain cause of the rash some of that because it is in a line and crease if neck  we can treat it or yeast. This is because it didn't respond to cortisone or the topical antibiotic you used. It doesn't look like a spider bite and is not shingles. Try the cream twice a day for 10-14 days if still not improved need recheck at her primary office. If you get a fever or swelling contact earlier.    Standley Brooking. Panosh M.D.

## 2015-12-13 ENCOUNTER — Ambulatory Visit (INDEPENDENT_AMBULATORY_CARE_PROVIDER_SITE_OTHER): Payer: Medicare Other | Admitting: Internal Medicine

## 2015-12-13 ENCOUNTER — Ambulatory Visit (INDEPENDENT_AMBULATORY_CARE_PROVIDER_SITE_OTHER)
Admission: RE | Admit: 2015-12-13 | Discharge: 2015-12-13 | Disposition: A | Discharge status: Home / Self Care | Payer: Medicare Other | Source: Ambulatory Visit | Attending: Internal Medicine | Admitting: Internal Medicine

## 2015-12-13 ENCOUNTER — Encounter: Payer: Self-pay | Admitting: Internal Medicine

## 2015-12-13 VITALS — BP 138/80 | HR 85 | Temp 98.0°F | Wt 194.0 lb

## 2015-12-13 DIAGNOSIS — R220 Localized swelling, mass and lump, head: Secondary | ICD-10-CM | POA: Diagnosis not present

## 2015-12-13 DIAGNOSIS — F419 Anxiety disorder, unspecified: Secondary | ICD-10-CM

## 2015-12-13 DIAGNOSIS — M26622 Arthralgia of left temporomandibular joint: Secondary | ICD-10-CM

## 2015-12-13 DIAGNOSIS — M545 Low back pain, unspecified: Secondary | ICD-10-CM

## 2015-12-13 DIAGNOSIS — M5412 Radiculopathy, cervical region: Secondary | ICD-10-CM | POA: Diagnosis not present

## 2015-12-13 DIAGNOSIS — M26629 Arthralgia of temporomandibular joint, unspecified side: Secondary | ICD-10-CM | POA: Insufficient documentation

## 2015-12-13 DIAGNOSIS — R252 Cramp and spasm: Secondary | ICD-10-CM

## 2015-12-13 DIAGNOSIS — R51 Headache: Secondary | ICD-10-CM | POA: Diagnosis not present

## 2015-12-13 MED ORDER — PREDNISONE 10 MG PO TABS: ORAL_TABLET | ORAL | 0 refills | Status: DC

## 2015-12-13 MED ORDER — TIZANIDINE HCL 4 MG PO TABS: 4.0000 mg | ORAL_TABLET | Freq: Four times a day (QID) | ORAL | 1 refills | Status: DC | PRN

## 2015-12-13 MED ORDER — TRAMADOL HCL 50 MG PO TABS: 50.0000 mg | ORAL_TABLET | Freq: Three times a day (TID) | ORAL | 1 refills | Status: DC | PRN

## 2015-12-13 NOTE — Assessment & Plan Note (Signed)
Also for muscle relaxer prn

## 2015-12-13 NOTE — Assessment & Plan Note (Signed)
Much increased benzo use exacerbated by time to evaluation delayed, pt educated, reassured, to minimize xanax use, for other med tx as above,  to f/u any worsening symptoms or concerns

## 2015-12-13 NOTE — Assessment & Plan Note (Signed)
New onset, disctinct from her prior cva with right sided weakness, for pain control, prednisone, MRI c spine and referral NS

## 2015-12-13 NOTE — Assessment & Plan Note (Signed)
C/w msk strain, for muscle relaxer as well

## 2015-12-13 NOTE — Patient Instructions (Addendum)
Please take all new medication as prescribed - the pain medication, muscle relaxer, and prednisone  Please continue all other medications as before, and refills have been done if requested.  Please have the pharmacy call with any other refills you may need.  Please keep your appointments with your specialists as you may have planned  You will be contacted regarding the referral for: oral surgury, MRI for the neck, and Neurosurgury referral  Please return in 2 weeks, or sooner if needed, to Dr Quay Burow

## 2015-12-13 NOTE — Progress Notes (Signed)
Pre visit review using our clinic review tool, if applicable. No additional management support is needed unless otherwise documented below in the visit note. 
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
## 2015-12-13 NOTE — Addendum Note (Signed)
Addended by: Biagio Borg on: 12/13/2015 05:13 PM   Modules accepted: Orders

## 2015-12-18 ENCOUNTER — Telehealth: Payer: Self-pay | Admitting: Emergency Medicine

## 2015-12-19 ENCOUNTER — Encounter: Payer: Self-pay | Admitting: Internal Medicine

## 2015-12-19 NOTE — Telephone Encounter (Signed)
error 

## 2015-12-20 ENCOUNTER — Ambulatory Visit
Admission: RE | Admit: 2015-12-20 | Discharge: 2015-12-20 | Disposition: A | Discharge status: Home / Self Care | Payer: Medicare Other | Source: Ambulatory Visit | Attending: Internal Medicine | Admitting: Internal Medicine

## 2015-12-20 DIAGNOSIS — M50222 Other cervical disc displacement at C5-C6 level: Secondary | ICD-10-CM | POA: Diagnosis not present

## 2015-12-20 DIAGNOSIS — M5412 Radiculopathy, cervical region: Secondary | ICD-10-CM

## 2015-12-20 DIAGNOSIS — M50221 Other cervical disc displacement at C4-C5 level: Secondary | ICD-10-CM | POA: Diagnosis not present

## 2015-12-26 ENCOUNTER — Ambulatory Visit (INDEPENDENT_AMBULATORY_CARE_PROVIDER_SITE_OTHER): Payer: Medicare Other | Admitting: Internal Medicine

## 2015-12-26 ENCOUNTER — Encounter: Payer: Self-pay | Admitting: Internal Medicine

## 2015-12-26 VITALS — BP 130/70 | HR 64 | Resp 2 | Wt 196.0 lb

## 2015-12-26 DIAGNOSIS — R51 Headache: Secondary | ICD-10-CM

## 2015-12-26 DIAGNOSIS — R519 Headache, unspecified: Secondary | ICD-10-CM

## 2015-12-26 DIAGNOSIS — M79672 Pain in left foot: Secondary | ICD-10-CM | POA: Insufficient documentation

## 2015-12-26 DIAGNOSIS — M5412 Radiculopathy, cervical region: Secondary | ICD-10-CM

## 2015-12-26 DIAGNOSIS — M26622 Arthralgia of left temporomandibular joint: Secondary | ICD-10-CM
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
## 2015-12-26 NOTE — Patient Instructions (Addendum)
Please continue all other medications as before, and refills have been done if requested.  Please have the pharmacy call with any other refills you may need.  Please continue your efforts at being more active, low cholesterol diet, and weight control.  Please keep your appointments with your specialists as you may have planned  You will be contacted regarding the referral for: ENT, as well as Dr Tamala Julian in this office for the heel pain (or you could make the appt on your own at the scheduling desk as you leave today)  Please see Dr Quay Burow at the next available appt

## 2015-12-26 NOTE — Progress Notes (Signed)
Pre visit review using our clinic review tool, if applicable. No additional management support is needed unless otherwise documented below in the visit note. 

## 2015-12-29 NOTE — Assessment & Plan Note (Signed)
Improved exam, MRI reviewed with pt, to f/u any worsening symptoms or concerns

## 2015-12-29 NOTE — Assessment & Plan Note (Signed)
C/w plantar and achilles tendonitis, for pain control, also refer sport med/dr Tamala Julian in this office

## 2015-12-29 NOTE — Assessment & Plan Note (Signed)
Swelling improved, but still pain and reduced ROM, for ENT referral

## 2016-01-09 ENCOUNTER — Ambulatory Visit (INDEPENDENT_AMBULATORY_CARE_PROVIDER_SITE_OTHER): Payer: Medicare Other | Admitting: General Practice

## 2016-01-09 DIAGNOSIS — Z5181 Encounter for therapeutic drug level monitoring: Secondary | ICD-10-CM | POA: Diagnosis not present

## 2016-01-09 DIAGNOSIS — I639 Cerebral infarction, unspecified: Secondary | ICD-10-CM

## 2016-01-09 LAB — POCT INR: INR: 2
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
## 2016-01-17 ENCOUNTER — Encounter: Payer: Self-pay | Admitting: Family Medicine

## 2016-01-17 ENCOUNTER — Other Ambulatory Visit: Payer: Self-pay

## 2016-01-17 ENCOUNTER — Ambulatory Visit (INDEPENDENT_AMBULATORY_CARE_PROVIDER_SITE_OTHER): Payer: Medicare Other | Admitting: Family Medicine

## 2016-01-17 VITALS — BP 116/80 | HR 66 | Wt 196.0 lb

## 2016-01-17 DIAGNOSIS — M7661 Achilles tendinitis, right leg: Secondary | ICD-10-CM

## 2016-01-17 DIAGNOSIS — M25532 Pain in left wrist: Secondary | ICD-10-CM | POA: Diagnosis not present

## 2016-01-17 DIAGNOSIS — M6788 Other specified disorders of synovium and tendon, other site: Secondary | ICD-10-CM | POA: Insufficient documentation

## 2016-01-17 DIAGNOSIS — M79671 Pain in right foot: Secondary | ICD-10-CM | POA: Diagnosis not present

## 2016-01-17 MED ORDER — GABAPENTIN 100 MG PO CAPS: 200.0000 mg | ORAL_CAPSULE | Freq: Every day | ORAL | 3 refills | Status: DC

## 2016-01-17 MED ORDER — VITAMIN D (ERGOCALCIFEROL) 1.25 MG (50000 UNIT) PO CAPS: 50000.0000 [IU] | ORAL_CAPSULE | ORAL | 0 refills | Status: DC

## 2016-01-17 NOTE — Assessment & Plan Note (Signed)
Mild overall. We discussed icing regimen, home exercises, heel lift that I think will be beneficial as well as given topical anti-inflammatories. We'll avoid oral anti-inflammatories due to blood thinner. Patient will come back and see me again in 3-4 weeks. If continuing have pain we'll consider formal physical therapy as well as bracing with patient also having peripheral vascular disease I would consider also an ABI to rule out any type of vascular compromise a could be contributing to some of her discomfort.

## 2016-01-17 NOTE — Assessment & Plan Note (Signed)
Patient's left wrist pain could be possibly a small ulnar fracture versus a tendon injury. Patient seems to be improving even though this is been 6 weeks. Patient was given a brace that she will slowly wear as well as topical anti-inflammatories and icing protocol. Patient will do some range of motion exercises. Patient come back and see me again in 3 weeks.

## 2016-01-17 NOTE — Patient Instructions (Signed)
Good to see you  Ice 20 minutes 2 times daily. Usually after activity and before bed. For the wrist wear the brace day and night for 1 week then nightly for 2 weeks Once weekly vitamin D for next 12 weeks to help with healing.  pennsaid pinkie amount topically 2 times daily as needed.   For the ankle get a heel lift from [facility] and put it in your shoe.  pennsaid pinkie amount topically 2 times daily as needed.  Exercises 3 times a week.  Avoid jumping or running.   For cramping in legs lets try gabapentin 200mg  at night.   See me again in 3-4 weeks. We will make sure wrist and ankle are healing.

## 2016-01-18 DIAGNOSIS — H9202 Otalgia, left ear: Secondary | ICD-10-CM | POA: Diagnosis not present

## 2016-01-18 DIAGNOSIS — M26622 Arthralgia of left temporomandibular joint: Secondary | ICD-10-CM | POA: Diagnosis not present

## 2016-01-19 ENCOUNTER — Encounter: Payer: Self-pay | Admitting: Family Medicine

## 2016-01-25 ENCOUNTER — Encounter: Payer: Self-pay | Admitting: Internal Medicine

## 2016-01-25 ENCOUNTER — Ambulatory Visit (INDEPENDENT_AMBULATORY_CARE_PROVIDER_SITE_OTHER): Payer: Medicare Other | Admitting: Internal Medicine

## 2016-01-25 VITALS — BP 134/84 | HR 65 | Temp 97.6°F | Resp 16 | Wt 196.0 lb

## 2016-01-25 DIAGNOSIS — G44309 Post-traumatic headache, unspecified, not intractable: Secondary | ICD-10-CM | POA: Diagnosis not present

## 2016-01-25 DIAGNOSIS — M5412 Radiculopathy, cervical region: Secondary | ICD-10-CM

## 2016-01-25 DIAGNOSIS — Z23 Encounter for immunization: Secondary | ICD-10-CM | POA: Diagnosis not present

## 2016-01-25 DIAGNOSIS — E785 Hyperlipidemia, unspecified: Secondary | ICD-10-CM

## 2016-01-25 DIAGNOSIS — I10 Essential (primary) hypertension: Secondary | ICD-10-CM

## 2016-01-25 MED ORDER — CITALOPRAM HYDROBROMIDE 20 MG PO TABS: 20.0000 mg | ORAL_TABLET | Freq: Every day | ORAL | 1 refills | Status: DC

## 2016-01-25 MED ORDER — ALPRAZOLAM 0.5 MG PO TABS: ORAL_TABLET | ORAL | 1 refills | Status: DC

## 2016-01-25 MED ORDER — NORTRIPTYLINE HCL 25 MG PO CAPS: 25.0000 mg | ORAL_CAPSULE | Freq: Every day | ORAL | 0 refills | Status: DC

## 2016-01-25 MED ORDER — METHOCARBAMOL 500 MG PO TABS: 500.0000 mg | ORAL_TABLET | Freq: Four times a day (QID) | ORAL | 0 refills | Status: DC | PRN

## 2016-01-25 MED ORDER — FUROSEMIDE 40 MG PO TABS: ORAL_TABLET | ORAL | 1 refills | Status: DC
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
## 2016-01-25 NOTE — Patient Instructions (Addendum)
We will try a different muscle relaxer - do not take the tizanidine.  Try methocarbamol.   Hold the gabapentin and try nortriptyline at bedtime.    Update me on how you are feeling next week.   Try using heat on your neck and upper back.
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
## 2016-01-25 NOTE — Assessment & Plan Note (Signed)
Continue statin. 

## 2016-01-25 NOTE — Assessment & Plan Note (Signed)
BP well controlled Current regimen effective and well tolerated Continue current medications at current doses  
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
## 2016-01-25 NOTE — Progress Notes (Signed)
Pre visit review using our clinic review tool, if applicable. No additional management support is needed unless otherwise documented below in the visit note. 

## 2016-01-29 ENCOUNTER — Encounter: Payer: Self-pay | Admitting: Internal Medicine

## 2016-01-31 ENCOUNTER — Encounter: Payer: Self-pay | Admitting: Family Medicine

## 2016-02-08 ENCOUNTER — Ambulatory Visit (INDEPENDENT_AMBULATORY_CARE_PROVIDER_SITE_OTHER): Payer: Medicare Other | Admitting: Neurology

## 2016-02-08 ENCOUNTER — Encounter: Payer: Self-pay | Admitting: Neurology

## 2016-02-08 DIAGNOSIS — R4701 Aphasia: Secondary | ICD-10-CM | POA: Diagnosis not present

## 2016-02-08 DIAGNOSIS — F4311 Post-traumatic stress disorder, acute: Secondary | ICD-10-CM

## 2016-02-08 DIAGNOSIS — R200 Anesthesia of skin: Secondary | ICD-10-CM | POA: Diagnosis not present

## 2016-02-08 DIAGNOSIS — Z8673 Personal history of transient ischemic attack (TIA), and cerebral infarction without residual deficits: Secondary | ICD-10-CM | POA: Diagnosis not present
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
## 2016-02-08 NOTE — Patient Instructions (Signed)
1.  We will get MRI of brain and MRA of head and neck 2.  Further recommendations pending results. 3.  Follow up
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
## 2016-02-12 ENCOUNTER — Encounter: Payer: Self-pay | Admitting: Family Medicine

## 2016-02-12 ENCOUNTER — Ambulatory Visit (INDEPENDENT_AMBULATORY_CARE_PROVIDER_SITE_OTHER)
Admission: RE | Admit: 2016-02-12 | Discharge: 2016-02-12 | Disposition: A | Discharge status: Home / Self Care | Payer: Medicare Other | Source: Ambulatory Visit | Attending: Family Medicine | Admitting: Family Medicine

## 2016-02-12 ENCOUNTER — Ambulatory Visit (INDEPENDENT_AMBULATORY_CARE_PROVIDER_SITE_OTHER): Payer: Medicare Other | Admitting: Family Medicine

## 2016-02-12 ENCOUNTER — Other Ambulatory Visit: Payer: Self-pay

## 2016-02-12 VITALS — BP 124/72 | HR 66 | Wt 197.0 lb

## 2016-02-12 DIAGNOSIS — M545 Low back pain, unspecified: Secondary | ICD-10-CM

## 2016-02-12 DIAGNOSIS — S3992XA Unspecified injury of lower back, initial encounter: Secondary | ICD-10-CM | POA: Diagnosis not present

## 2016-02-12 DIAGNOSIS — M25532 Pain in left wrist: Secondary | ICD-10-CM

## 2016-02-12 DIAGNOSIS — M5416 Radiculopathy, lumbar region: Secondary | ICD-10-CM

## 2016-02-12 DIAGNOSIS — R2681 Unsteadiness on feet: Secondary | ICD-10-CM

## 2016-02-12 MED ORDER — GABAPENTIN 300 MG PO CAPS: ORAL_CAPSULE | ORAL | 3 refills | Status: DC

## 2016-02-12 NOTE — Patient Instructions (Addendum)
The wrist is healing great  Xray of your lower back  Because of the weakness of your leg we will get an MRi as well.  I will send a message when I get it and discuss what to do next  Increase gabapentin to 300mg  and I will send in new prescription  For the wrist see me again in 4-6 weeks.

## 2016-02-12 NOTE — Assessment & Plan Note (Signed)
Seems to be doing much better. Did have a distal radial fracture that I think is healed at this time. Concern for some mild left cervical radiculopathy and has been followed by neurology for this. Patient continued to be monitored closely. Follow-up again with me in 3-4 weeks to make sure he healing appropriately. Continue once weekly vitamin D supplementation and refill given today.

## 2016-02-12 NOTE — Assessment & Plan Note (Signed)
Patient is having more weakness of the left side. I believe the patient insistence he with L5 or S1 nerve root impingement. This is new and seem to be coming on quickly. Likely secondary to the antalgic gait. Patient was started on gabapentin increased 300 mg. Patient will have x-rays done today because of the weakness I would like to order an MRI as well. Patient is on a blood thinner leaning to make sure that there is no slow bleeding that could also be contribute in. Patient will follow-up with me after the MRI and we'll discuss further options such as epidural.

## 2016-02-12 NOTE — Assessment & Plan Note (Signed)
Secondary to weakness likely's from lumbar radiculopathy on the left leg

## 2016-02-15 ENCOUNTER — Encounter: Payer: Self-pay | Admitting: Family Medicine

## 2016-02-20 ENCOUNTER — Ambulatory Visit: Payer: Medicare Other

## 2016-02-21 ENCOUNTER — Other Ambulatory Visit: Payer: Medicare Other

## 2016-02-21 DIAGNOSIS — M25532 Pain in left wrist: Secondary | ICD-10-CM

## 2016-02-22 ENCOUNTER — Ambulatory Visit
Admission: RE | Admit: 2016-02-22 | Discharge: 2016-02-22 | Disposition: A | Discharge status: Home / Self Care | Payer: Medicare Other | Source: Ambulatory Visit | Attending: Neurology | Admitting: Neurology

## 2016-02-22 DIAGNOSIS — R4701 Aphasia: Secondary | ICD-10-CM

## 2016-02-22 DIAGNOSIS — R200 Anesthesia of skin: Secondary | ICD-10-CM

## 2016-02-22 DIAGNOSIS — R51 Headache: Secondary | ICD-10-CM | POA: Diagnosis not present

## 2016-02-22 DIAGNOSIS — Z8673 Personal history of transient ischemic attack (TIA), and cerebral infarction without residual deficits: Secondary | ICD-10-CM

## 2016-02-22 MED ORDER — GADOBENATE DIMEGLUMINE 529 MG/ML IV SOLN
18.0000 mL | Freq: Once | INTRAVENOUS | Status: AC | PRN
  Administered 2016-02-22: 18 mL via INTRAVENOUS

## 2016-02-25 ENCOUNTER — Encounter: Payer: Self-pay | Admitting: Neurology

## 2016-02-25 ENCOUNTER — Telehealth: Payer: Self-pay

## 2016-02-25 ENCOUNTER — Ambulatory Visit
Admission: RE | Admit: 2016-02-25 | Discharge: 2016-02-25 | Disposition: A | Discharge status: Home / Self Care | Payer: Medicare Other | Source: Ambulatory Visit | Attending: Family Medicine | Admitting: Family Medicine

## 2016-02-25 DIAGNOSIS — R4701 Aphasia: Secondary | ICD-10-CM

## 2016-02-25 DIAGNOSIS — Z8673 Personal history of transient ischemic attack (TIA), and cerebral infarction without residual deficits: Secondary | ICD-10-CM

## 2016-02-25 DIAGNOSIS — M5416 Radiculopathy, lumbar region: Secondary | ICD-10-CM

## 2016-02-25 DIAGNOSIS — R200 Anesthesia of skin: Secondary | ICD-10-CM

## 2016-02-25 DIAGNOSIS — M5126 Other intervertebral disc displacement, lumbar region: Secondary | ICD-10-CM | POA: Diagnosis not present

## 2016-02-25 NOTE — Telephone Encounter (Signed)
Attempted to reach. Line continuously rang. Will send results via mychart.  MRA head appears that order was over looked. Will get pt's okay when she calls to get that set up.

## 2016-02-25 NOTE — Telephone Encounter (Signed)
-----   Message from Pieter Partridge, DO sent at 02/25/2016  7:06 AM EDT ----- MRI of brain and MRA of neck look okay.  No stroke, bleed or tumor.  I ordered an MRA of the head which was not performed.  Could we confirm?

## 2016-02-26 NOTE — Telephone Encounter (Signed)
Please see mychart exchange.

## 2016-02-27 ENCOUNTER — Ambulatory Visit (INDEPENDENT_AMBULATORY_CARE_PROVIDER_SITE_OTHER): Payer: Medicare Other | Admitting: General Practice

## 2016-02-27 ENCOUNTER — Encounter: Payer: Self-pay | Admitting: Family Medicine

## 2016-02-27 DIAGNOSIS — I639 Cerebral infarction, unspecified: Secondary | ICD-10-CM

## 2016-02-27 DIAGNOSIS — Z5181 Encounter for therapeutic drug level monitoring: Secondary | ICD-10-CM | POA: Diagnosis not present

## 2016-02-27 LAB — POCT INR: INR: 2.2

## 2016-02-27 NOTE — Patient Instructions (Signed)
Pre visit review using our clinic review tool, if applicable. No additional management support is needed unless otherwise documented below in the visit note. 

## 2016-02-27 NOTE — Progress Notes (Signed)
I agree with this plan.

## 2016-02-28 ENCOUNTER — Encounter: Payer: Self-pay | Admitting: Internal Medicine

## 2016-02-28 ENCOUNTER — Telehealth: Payer: Self-pay

## 2016-02-28 ENCOUNTER — Encounter: Payer: Self-pay | Admitting: Family Medicine

## 2016-02-28 ENCOUNTER — Encounter: Payer: Self-pay | Admitting: Neurology

## 2016-02-28 NOTE — Telephone Encounter (Signed)
Reply sent via mychart

## 2016-02-28 NOTE — Telephone Encounter (Signed)
Please see pt's my chart message

## 2016-02-28 NOTE — Telephone Encounter (Signed)
It may suggest an infection, but unless there is associated ear pain and fever, it may be of unknown clinical significance.  However, if there is pain behind that ear, she may want to discuss with her PCP.

## 2016-02-29 ENCOUNTER — Encounter: Payer: Self-pay | Admitting: Internal Medicine

## 2016-03-02 NOTE — Telephone Encounter (Signed)
Please send copy

## 2016-03-03 ENCOUNTER — Encounter: Payer: Self-pay | Admitting: Internal Medicine

## 2016-03-04 ENCOUNTER — Other Ambulatory Visit: Payer: Self-pay | Admitting: General Practice

## 2016-03-04 ENCOUNTER — Telehealth: Payer: Self-pay | Admitting: *Deleted

## 2016-03-04 MED ORDER — WARFARIN SODIUM 5 MG PO TABS: 5.0000 mg | ORAL_TABLET | Freq: Every day | ORAL | 1 refills | Status: DC

## 2016-03-04 NOTE — Telephone Encounter (Signed)
Rec'd fax from [facility] pt is requesting 90 day supply on her Warfarin. Forwarding to cindy in coumadin clinic...Johny Chess

## 2016-03-05 ENCOUNTER — Other Ambulatory Visit: Payer: Self-pay | Admitting: General Practice

## 2016-03-05 ENCOUNTER — Ambulatory Visit
Admission: RE | Admit: 2016-03-05 | Discharge: 2016-03-05 | Disposition: A | Discharge status: Home / Self Care | Payer: Medicare Other | Source: Ambulatory Visit | Attending: Neurology | Admitting: Neurology

## 2016-03-05 DIAGNOSIS — R200 Anesthesia of skin: Secondary | ICD-10-CM

## 2016-03-05 DIAGNOSIS — R51 Headache: Secondary | ICD-10-CM | POA: Diagnosis not present

## 2016-03-05 DIAGNOSIS — Z8673 Personal history of transient ischemic attack (TIA), and cerebral infarction without residual deficits: Secondary | ICD-10-CM

## 2016-03-05 DIAGNOSIS — R4701 Aphasia: Secondary | ICD-10-CM

## 2016-03-05 NOTE — Telephone Encounter (Signed)
Closing encounter refill was sent to [facility]...Johny Chess

## 2016-03-06 ENCOUNTER — Telehealth: Payer: Self-pay

## 2016-03-06 DIAGNOSIS — F4310 Post-traumatic stress disorder, unspecified: Secondary | ICD-10-CM

## 2016-03-06 NOTE — Telephone Encounter (Signed)
Sent via mychart

## 2016-03-06 NOTE — Telephone Encounter (Signed)
-----   Message from Pieter Partridge, DO sent at 03/06/2016  7:18 AM EDT ----- MRA is normal
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
## 2016-03-13 ENCOUNTER — Encounter: Payer: Self-pay | Admitting: Neurology

## 2016-03-13 ENCOUNTER — Encounter: Payer: Self-pay | Admitting: Internal Medicine

## 2016-03-16 ENCOUNTER — Encounter: Payer: Self-pay | Admitting: Internal Medicine

## 2016-03-16 DIAGNOSIS — F4310 Post-traumatic stress disorder, unspecified: Secondary | ICD-10-CM

## 2016-03-16 DIAGNOSIS — F32A Depression, unspecified: Secondary | ICD-10-CM

## 2016-03-16 DIAGNOSIS — F329 Major depressive disorder, single episode, unspecified: Secondary | ICD-10-CM

## 2016-03-16 DIAGNOSIS — F419 Anxiety disorder, unspecified: Secondary | ICD-10-CM

## 2016-03-18 ENCOUNTER — Encounter: Payer: Self-pay | Admitting: Neurology

## 2016-03-19 ENCOUNTER — Encounter: Payer: Self-pay | Admitting: Family Medicine

## 2016-03-19 NOTE — Telephone Encounter (Signed)
Please advise 

## 2016-03-20 ENCOUNTER — Encounter: Payer: Self-pay | Admitting: Family Medicine

## 2016-03-21 ENCOUNTER — Encounter: Payer: Self-pay | Admitting: Family Medicine

## 2016-03-22 ENCOUNTER — Encounter: Payer: Self-pay | Admitting: Internal Medicine
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
## 2016-03-24 ENCOUNTER — Encounter: Payer: Self-pay | Admitting: Family Medicine

## 2016-03-24 ENCOUNTER — Ambulatory Visit (INDEPENDENT_AMBULATORY_CARE_PROVIDER_SITE_OTHER): Payer: Medicare Other | Admitting: Family Medicine

## 2016-03-24 ENCOUNTER — Encounter: Payer: Self-pay | Admitting: Internal Medicine

## 2016-03-24 VITALS — BP 120/70 | HR 65 | Ht 64.0 in | Wt 200.0 lb

## 2016-03-24 DIAGNOSIS — M5416 Radiculopathy, lumbar region: Secondary | ICD-10-CM | POA: Diagnosis not present

## 2016-03-24 DIAGNOSIS — S63092A Other subluxation of left wrist and hand, initial encounter: Secondary | ICD-10-CM

## 2016-03-24 DIAGNOSIS — S63095A Other dislocation of left wrist and hand, initial encounter: Secondary | ICD-10-CM | POA: Diagnosis not present

## 2016-03-24 MED ORDER — DULOXETINE HCL 20 MG PO CPEP: 20.0000 mg | ORAL_CAPSULE | Freq: Every day | ORAL | 1 refills | Status: DC

## 2016-03-24 NOTE — Assessment & Plan Note (Signed)
Patient given injection today and tolerated the procedure well. We discussed icing regimen and home exercises. We discussed which activities doing which was to avoid. Patient will wear brace as needed. Worsening symptoms when deemed MR arthrogram which I do not think will be necessary. Follow-up in 4 weeks

## 2016-03-24 NOTE — Patient Instructions (Signed)
Good to see you  Ice is still your friend Injected the wrist today and I hope it helps We will get the epidural ordered and see if we can help some of the pain.  See me again in 3 weeks after the injection to make sure you are doing better.

## 2016-03-24 NOTE — Assessment & Plan Note (Signed)
Patient's MRI shows more of a right-sided impingement the patient continues to have pain at this time. I am concerned with patient potentially having more of a herniated distance causing more of a spinal stenosis that was not seen on MRI completely. We discussed different treatment options. Patient has elected to try an epidural injection that I think could be beneficial. This was ordered today. We discussed icing regimen patient will continue with the vitamin D supplementation. We did discuss the gabapentin patient was started on Cymbalta. I do think that this could be beneficial.

## 2016-03-25 NOTE — Telephone Encounter (Signed)
Please advise 

## 2016-03-26 ENCOUNTER — Other Ambulatory Visit: Payer: Self-pay | Admitting: *Deleted

## 2016-03-26 ENCOUNTER — Telehealth: Payer: Self-pay | Admitting: Emergency Medicine

## 2016-03-26 MED ORDER — DULOXETINE HCL 20 MG PO CPEP: 20.0000 mg | ORAL_CAPSULE | Freq: Every day | ORAL | 0 refills | Status: DC

## 2016-03-26 NOTE — Telephone Encounter (Signed)
Pt called and her mail order can not fill her DULoxetine (CYMBALTA) 20 MG capsule. Can she have it call into [facility] instead. Please advise thanks.

## 2016-03-26 NOTE — Telephone Encounter (Signed)
[facility]

## 2016-03-26 NOTE — Telephone Encounter (Signed)
Resent rx to correct pharmacy. 

## 2016-03-29 ENCOUNTER — Encounter: Payer: Self-pay | Admitting: Internal Medicine

## 2016-03-31 ENCOUNTER — Ambulatory Visit (INDEPENDENT_AMBULATORY_CARE_PROVIDER_SITE_OTHER): Payer: 59 | Admitting: Licensed Clinical Social Worker

## 2016-03-31 ENCOUNTER — Encounter: Payer: Self-pay | Admitting: Internal Medicine

## 2016-03-31 ENCOUNTER — Encounter: Payer: Self-pay | Admitting: Family Medicine

## 2016-03-31 DIAGNOSIS — F4310 Post-traumatic stress disorder, unspecified: Secondary | ICD-10-CM

## 2016-03-31 MED ORDER — DULOXETINE HCL 30 MG PO CPEP: 30.0000 mg | ORAL_CAPSULE | Freq: Every day | ORAL | 1 refills | Status: DC

## 2016-03-31 MED ORDER — GABAPENTIN 400 MG PO CAPS: 400.0000 mg | ORAL_CAPSULE | Freq: Three times a day (TID) | ORAL | 3 refills | Status: DC

## 2016-04-01 ENCOUNTER — Telehealth: Payer: Self-pay | Admitting: Internal Medicine

## 2016-04-01 ENCOUNTER — Encounter: Payer: Self-pay | Admitting: Internal Medicine

## 2016-04-01 NOTE — Telephone Encounter (Signed)
Pt called in and said that she needs her cymbo

## 2016-04-03 ENCOUNTER — Encounter: Payer: Self-pay | Admitting: Internal Medicine

## 2016-04-04 ENCOUNTER — Encounter: Payer: Self-pay | Admitting: Internal Medicine

## 2016-04-04 ENCOUNTER — Other Ambulatory Visit: Payer: Self-pay | Admitting: Emergency Medicine

## 2016-04-04 MED ORDER — DULOXETINE HCL 30 MG PO CPEP: 30.0000 mg | ORAL_CAPSULE | Freq: Every day | ORAL | 1 refills | Status: DC

## 2016-04-04 MED ORDER — GABAPENTIN 400 MG PO CAPS: 400.0000 mg | ORAL_CAPSULE | Freq: Three times a day (TID) | ORAL | 1 refills | Status: DC

## 2016-04-09 ENCOUNTER — Ambulatory Visit (INDEPENDENT_AMBULATORY_CARE_PROVIDER_SITE_OTHER): Payer: Medicare Other | Admitting: General Practice

## 2016-04-09 DIAGNOSIS — I639 Cerebral infarction, unspecified: Secondary | ICD-10-CM

## 2016-04-09 DIAGNOSIS — Z5181 Encounter for therapeutic drug level monitoring: Secondary | ICD-10-CM

## 2016-04-09 LAB — POCT INR: INR: 2.4

## 2016-04-09 NOTE — Progress Notes (Signed)
I agree with this plan.

## 2016-04-09 NOTE — Patient Instructions (Signed)
Pre visit review using our clinic review tool, if applicable. No additional management support is needed unless otherwise documented below in the visit note. 

## 2016-04-30 ENCOUNTER — Ambulatory Visit (INDEPENDENT_AMBULATORY_CARE_PROVIDER_SITE_OTHER): Payer: 59 | Admitting: Licensed Clinical Social Worker

## 2016-04-30 DIAGNOSIS — F4310 Post-traumatic stress disorder, unspecified: Secondary | ICD-10-CM

## 2016-05-07 ENCOUNTER — Ambulatory Visit (INDEPENDENT_AMBULATORY_CARE_PROVIDER_SITE_OTHER): Payer: Medicare Other | Admitting: Internal Medicine

## 2016-05-07 ENCOUNTER — Encounter: Payer: Self-pay | Admitting: Internal Medicine

## 2016-05-07 VITALS — BP 120/80 | HR 65 | Temp 97.6°F | Resp 16 | Wt 194.0 lb

## 2016-05-07 DIAGNOSIS — F419 Anxiety disorder, unspecified: Secondary | ICD-10-CM | POA: Diagnosis not present

## 2016-05-07 DIAGNOSIS — I10 Essential (primary) hypertension: Secondary | ICD-10-CM | POA: Diagnosis not present

## 2016-05-07 DIAGNOSIS — F329 Major depressive disorder, single episode, unspecified: Secondary | ICD-10-CM

## 2016-05-07 DIAGNOSIS — R079 Chest pain, unspecified: Secondary | ICD-10-CM

## 2016-05-07 DIAGNOSIS — F32A Depression, unspecified: Secondary | ICD-10-CM

## 2016-05-07 DIAGNOSIS — I639 Cerebral infarction, unspecified: Secondary | ICD-10-CM

## 2016-05-07 DIAGNOSIS — R2681 Unsteadiness on feet: Secondary | ICD-10-CM

## 2016-05-07 DIAGNOSIS — M5416 Radiculopathy, lumbar region: Secondary | ICD-10-CM

## 2016-05-07 DIAGNOSIS — M5412 Radiculopathy, cervical region: Secondary | ICD-10-CM

## 2016-05-07 MED ORDER — GABAPENTIN 400 MG PO CAPS: 400.0000 mg | ORAL_CAPSULE | Freq: Three times a day (TID) | ORAL | 5 refills | Status: DC

## 2016-05-07 MED ORDER — DULOXETINE HCL 30 MG PO CPEP: 60.0000 mg | ORAL_CAPSULE | Freq: Every day | ORAL | 1 refills | Status: DC

## 2016-05-07 NOTE — Assessment & Plan Note (Signed)
Related to lumbar radiculopathy Recommended PT - she will think about it

## 2016-05-07 NOTE — Patient Instructions (Addendum)
  An EKG was done today.  All other Health Maintenance issues reviewed.   All recommended immunizations and age-appropriate screenings are up-to-date or discussed.  No immunizations administered today.   Medications reviewed and updated.  Changes include increasing gabapentin to twice a day and if tolerated increase to three times a day. If it makes you too tired then let me know.   Also increase the cymbalta to 60 mg daily - do this before or after changing the gabapentin - not at the same time.  Your prescription(s) have been submitted to your pharmacy. Please take as directed and contact our office if you believe you are having problem(s) with the medication(s).   Please followup in 3 months

## 2016-05-07 NOTE — Assessment & Plan Note (Signed)
Reproducible Likely related to cervical radiculopathy Will do EKG but unlikely cardiac in nature

## 2016-05-07 NOTE — Progress Notes (Signed)
Pre visit review using our clinic review tool, if applicable. No additional management support is needed unless otherwise documented below in the visit note. 

## 2016-05-07 NOTE — Assessment & Plan Note (Signed)
Continue warfarin and statin BP well controlled

## 2016-05-07 NOTE — Progress Notes (Signed)
Subjective:    Patient ID: Amanda Turner, female    DOB: 1946/02/28, 71 y.o.   MRN: [id number]  HPI The patient is here for follow up.  Hypertension: She is taking her medication daily. She is compliant with a low sodium diet.  She is not exercising regularly. She does some housework.   Chest pain;  She has had frequent chest pain for months.  She is tender to palpations.  Her husband is concerned abou there heart.  She has chronic cervical radiculopathy pain.   Hyperlipidemia: She is taking her medication daily. She is compliant with a low fat/cholesterol diet. She is not exercising regularly. She denies myalgias.   Depression, anxiety:  She is seeing a therapist.  She takes the Cymbalta daily.  She uses the xanax as needed.   Left wrist pain:  She still has pain.  It is still swollen.  The pain is in the wrist and radiates up the arm.  Using her hand too much hurts.    Left cervical radiculopathy;  She gets radiating pain down her left arm.  She has chronic neck pain.   Lumbar radiculopathy: she does have pain in the left leg.  The pain is fairly constant.    She is taking gabapentin once a day.  She did not know she should or could take it more than once a day.   H/o CVA:  She is taking her medications daily as prescribed.   Medications and allergies reviewed with patient and updated if appropriate.  Patient Active Problem List   Diagnosis Date Noted  . Subluxation of extensor carpi ulnaris tendon, left, initial encounter 03/24/2016  . Left lumbar radiculopathy 02/12/2016  . Post-concussion headache 01/25/2016  . Achilles tendinosis 01/17/2016  . Left wrist pain 01/17/2016  . Pain of left heel 12/26/2015  . Temporomandibular joint (TMJ) pain 12/13/2015  . Left cervical radiculopathy 12/13/2015  . Low back pain 12/13/2015  . Leg cramps 12/13/2015  . Anxiety 10/23/2015  . Hyperglycemia 10/23/2015  . Gait instability 10/23/2015  . Edema 10/23/2015  . Encounter for  therapeutic drug monitoring 10/30/2014  . Carotid stenosis 09/06/2012  . Metabolic syndrome [id number]  . Lupus anticoagulant with hypercoagulable state ([place]) 06/23/2011  . Lupus erythematosus 06/23/2011  . CVA (cerebrovascular accident) ([place]) 03/06/2011  . Hematuria 02/11/2010  . 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 Hyperlipidemia 12/04/2006  . Depression 12/04/2006  . Essential hypertension 12/04/2006    Current Outpatient Prescriptions on File Prior to Visit  Medication Sig Dispense Refill  . ALPRAZolam (XANAX) 0.5 MG tablet Take 0.5 tab in morning and one tab in evening 135 tablet 1  . aspirin 81 MG tablet Take 81 mg by mouth daily.      . bifidobacterium infantis (ALIGN) capsule Take 1 capsule by mouth daily. 7 capsule 0  . furosemide (LASIX) 40 MG tablet TAKE 1 TABLET (40 MG TOTAL) BY MOUTH DAILY. 90 tablet 1  . lisinopril (PRINIVIL,ZESTRIL) 5 MG tablet TAKE 1 TABLET (5 MG TOTAL) BY MOUTH DAILY. 90 tablet 3  . potassium chloride SA (KLOR-CON M20) 20 MEQ tablet Take 1 tablet (20 mEq total) by mouth 2 (two) times daily. 180 tablet 3  . simvastatin (ZOCOR) 20 MG tablet Take 1 tablet (20 mg total) by mouth at bedtime. 90 tablet 3  . Vitamin D, Ergocalciferol, (DRISDOL) 50000 units CAPS capsule Take 1 capsule (50,000 Units total) by mouth every 7 (seven) days. 12 capsule 0  . warfarin (COUMADIN) 5 MG tablet  Take 1 tablet (5 mg total) by mouth daily. 90 tablet 1   No current facility-administered medications on file prior to visit.     Past Medical History:  Diagnosis Date  . Anxiety   . 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 Depression   . Elevated LFT's   . HA (headache)   . Hyperlipemia   . Hypertension   . Intracranial hemorrhage, spontaneous intraparenchymal, associated with coagulopathy, remote, resolved 06/23/2011  . Lupus anticoagulant disorder ([place])   . Lupus anticoagulant with hypercoagulable state ([place]) 06/23/2011  . Lupus  erythematosus 06/23/2011  . Stroke [facility])     Past Surgical History:  Procedure Laterality Date  . ABDOMINAL HYSTERECTOMY    . TRANSPHENOIDAL PITUITARY RESECTION      Social History   Social History  . Marital status: Married    Spouse name: N/A  . Number of children: 2  . Years of education: N/A   Occupational History  .  Unemployed   Social History Main Topics  . Smoking status: Never Smoker  . Smokeless tobacco: Never Used  . Alcohol use No  . Drug use: No  . Sexual activity: Not on file   Other Topics Concern  . Not on file   Social History Narrative   2 cups of coffee daily   Retired       Family History  Problem Relation Age of Onset  . Diabetes Brother   . Diabetes Sister   . Hypertension Mother   . Dementia Mother   . Bladder Cancer Brother   . Colon cancer Neg Hx     Review of Systems  Constitutional: Positive for fatigue. Negative for chills and fever.  HENT: Positive for ear pain (left) and hearing loss (left ear).   Respiratory: Negative for cough, shortness of breath and wheezing.   Cardiovascular: Positive for chest pain (anxiety related) and palpitations (anxiety related). Negative for leg swelling.  Musculoskeletal: Positive for back pain and neck pain.  Neurological: Positive for headaches (left side of head and posterior left ear). Negative for light-headedness.  Psychiatric/Behavioral: Positive for dysphoric mood. The patient is nervous/anxious.        Objective:   Vitals:   05/07/16 1552  BP: 120/80  Pulse: 65  Resp: 16  Temp: 97.6 F (36.4 C)   Wt Readings from Last 3 Encounters:  05/07/16 194 lb (88 kg)  03/24/16 200 lb (90.7 kg)  02/25/16 197 lb (89.4 kg)   Body mass index is 33.3 kg/m.   Physical Exam    Constitutional: Appears well-developed and well-nourished. No distress.  HENT:  Head: Normocephalic and atraumatic.  Neck: Neck supple. No tracheal deviation present. No thyromegaly present.  No cervical  lymphadenopathy Cardiovascular: Normal rate, regular rhythm and normal heart sounds.   No murmur heard. No carotid bruit .  No edema Pulmonary/Chest: chest tender to palpation.  Effort normal and breath sounds normal. No respiratory distress. No has no wheezes. No rales.  Skin: Skin is warm and dry. Not diaphoretic.  Psychiatric: Normal mood and affect. Behavior is normal.      Assessment & Plan:    See Problem List for Assessment and Plan of chronic medical problems.   FU in 3 months

## 2016-05-07 NOTE — Assessment & Plan Note (Signed)
BP well controlled Current regimen effective and well tolerated Continue current medications at current doses  

## 2016-05-07 NOTE — Assessment & Plan Note (Addendum)
Chronic pain in neck and down left arm Increase gabapentin to twice daily and if tolerated to three times daily Will let me know if 400 mg is too much Increase cymbalta to 60 mg daily (will do after increases gabapentin) Consider PT

## 2016-05-07 NOTE — Assessment & Plan Note (Signed)
Not ideally controlled Increase cymbalta to 60 mg daily

## 2016-05-07 NOTE — Assessment & Plan Note (Signed)
Increase gabapetin/cymbalta Consider PT Encouraged increasing exercise

## 2016-05-07 NOTE — Assessment & Plan Note (Addendum)
Not ideally controlled Taking cymbalta 30 mg daily -- increase to 60 mg daily

## 2016-05-10 ENCOUNTER — Other Ambulatory Visit: Payer: Self-pay | Admitting: Internal Medicine

## 2016-05-10 DIAGNOSIS — E7800 Pure hypercholesterolemia, unspecified: Secondary | ICD-10-CM

## 2016-05-10 DIAGNOSIS — R739 Hyperglycemia, unspecified: Secondary | ICD-10-CM

## 2016-05-10 DIAGNOSIS — I10 Essential (primary) hypertension: Secondary | ICD-10-CM

## 2016-05-21 ENCOUNTER — Ambulatory Visit: Payer: Self-pay

## 2016-05-27 ENCOUNTER — Encounter: Payer: Self-pay | Admitting: Internal Medicine

## 2016-05-27 DIAGNOSIS — M5416 Radiculopathy, lumbar region: Secondary | ICD-10-CM

## 2016-05-27 DIAGNOSIS — M5412 Radiculopathy, cervical region: Secondary | ICD-10-CM

## 2016-05-28 ENCOUNTER — Ambulatory Visit (INDEPENDENT_AMBULATORY_CARE_PROVIDER_SITE_OTHER): Payer: Medicare Other | Admitting: General Practice

## 2016-05-28 DIAGNOSIS — Z5181 Encounter for therapeutic drug level monitoring: Secondary | ICD-10-CM

## 2016-05-28 DIAGNOSIS — I639 Cerebral infarction, unspecified: Secondary | ICD-10-CM

## 2016-05-28 LAB — POCT INR: INR: 2.2

## 2016-05-28 NOTE — Patient Instructions (Signed)
Pre visit review using our clinic review tool, if applicable. No additional management support is needed unless otherwise documented below in the visit note. 

## 2016-05-28 NOTE — Progress Notes (Signed)
I agree with this plan.

## 2016-06-02 ENCOUNTER — Encounter: Payer: Self-pay | Admitting: Family Medicine

## 2016-06-04 ENCOUNTER — Ambulatory Visit: Payer: Medicare Other | Attending: Internal Medicine | Admitting: Physical Therapy

## 2016-06-04 ENCOUNTER — Encounter: Payer: Self-pay | Admitting: Internal Medicine

## 2016-06-04 DIAGNOSIS — M542 Cervicalgia: Secondary | ICD-10-CM | POA: Diagnosis not present

## 2016-06-04 DIAGNOSIS — G8929 Other chronic pain: Secondary | ICD-10-CM | POA: Diagnosis not present

## 2016-06-04 DIAGNOSIS — M5442 Lumbago with sciatica, left side: Secondary | ICD-10-CM | POA: Diagnosis not present

## 2016-06-04 DIAGNOSIS — M6281 Muscle weakness (generalized): Secondary | ICD-10-CM | POA: Diagnosis not present

## 2016-06-04 DIAGNOSIS — M5441 Lumbago with sciatica, right side: Secondary | ICD-10-CM | POA: Insufficient documentation

## 2016-06-05 ENCOUNTER — Telehealth: Payer: Self-pay | Admitting: Internal Medicine

## 2016-06-05 NOTE — Telephone Encounter (Signed)
Called to schedule awv. Left msg for patient to call office to schedule appt.

## 2016-06-09 ENCOUNTER — Ambulatory Visit (INDEPENDENT_AMBULATORY_CARE_PROVIDER_SITE_OTHER): Payer: 59 | Admitting: Licensed Clinical Social Worker

## 2016-06-09 DIAGNOSIS — F4310 Post-traumatic stress disorder, unspecified: Secondary | ICD-10-CM

## 2016-06-10 ENCOUNTER — Ambulatory Visit: Payer: Medicare Other | Attending: Internal Medicine | Admitting: Physical Therapy

## 2016-06-10 ENCOUNTER — Encounter: Payer: Self-pay | Admitting: Oncology

## 2016-06-10 ENCOUNTER — Encounter: Payer: Self-pay | Admitting: Physical Therapy

## 2016-06-10 DIAGNOSIS — G8929 Other chronic pain: Secondary | ICD-10-CM | POA: Diagnosis not present

## 2016-06-10 DIAGNOSIS — M5442 Lumbago with sciatica, left side: Secondary | ICD-10-CM | POA: Diagnosis not present

## 2016-06-10 DIAGNOSIS — M6281 Muscle weakness (generalized): Secondary | ICD-10-CM | POA: Insufficient documentation

## 2016-06-10 DIAGNOSIS — M542 Cervicalgia: Secondary | ICD-10-CM | POA: Diagnosis not present

## 2016-06-10 DIAGNOSIS — M5441 Lumbago with sciatica, right side: Secondary | ICD-10-CM | POA: Insufficient documentation
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
## 2016-06-11 ENCOUNTER — Encounter: Payer: Self-pay | Admitting: Neurology

## 2016-06-11 ENCOUNTER — Ambulatory Visit (INDEPENDENT_AMBULATORY_CARE_PROVIDER_SITE_OTHER): Payer: Medicare Other | Admitting: Neurology

## 2016-06-11 DIAGNOSIS — Z8673 Personal history of transient ischemic attack (TIA), and cerebral infarction without residual deficits: Secondary | ICD-10-CM | POA: Diagnosis not present

## 2016-06-11 DIAGNOSIS — R200 Anesthesia of skin: Secondary | ICD-10-CM | POA: Diagnosis not present

## 2016-06-11 DIAGNOSIS — M542 Cervicalgia: Secondary | ICD-10-CM

## 2016-06-11 DIAGNOSIS — F4310 Post-traumatic stress disorder, unspecified: Secondary | ICD-10-CM
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
## 2016-06-11 NOTE — Patient Instructions (Signed)
I think the main issue is treating the neck and muscle pain, which involves ongoing physical therapy and muscle relaxants.  You did not have another stroke.  I think it is important to continue seeing a therapist for the stress, as this may be contributing to the pain as well.

## 2016-06-12 ENCOUNTER — Encounter: Payer: Self-pay | Admitting: Internal Medicine

## 2016-06-12 ENCOUNTER — Encounter: Payer: Self-pay | Admitting: Physical Therapy

## 2016-06-12 ENCOUNTER — Ambulatory Visit: Payer: Medicare Other | Admitting: Physical Therapy

## 2016-06-12 DIAGNOSIS — M542 Cervicalgia: Secondary | ICD-10-CM | POA: Diagnosis not present

## 2016-06-12 DIAGNOSIS — G8929 Other chronic pain: Secondary | ICD-10-CM | POA: Diagnosis not present

## 2016-06-12 DIAGNOSIS — M5442 Lumbago with sciatica, left side: Secondary | ICD-10-CM

## 2016-06-12 DIAGNOSIS — M6281 Muscle weakness (generalized): Secondary | ICD-10-CM | POA: Diagnosis not present

## 2016-06-12 DIAGNOSIS — M5441 Lumbago with sciatica, right side: Secondary | ICD-10-CM
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
## 2016-06-13 ENCOUNTER — Ambulatory Visit (HOSPITAL_COMMUNITY): Payer: Medicare Other | Admitting: Psychiatry

## 2016-06-13 ENCOUNTER — Encounter: Payer: Self-pay | Admitting: Internal Medicine

## 2016-06-15 ENCOUNTER — Encounter: Payer: Self-pay | Admitting: Internal Medicine

## 2016-06-17 ENCOUNTER — Encounter: Payer: Self-pay | Admitting: Internal Medicine

## 2016-06-17 ENCOUNTER — Ambulatory Visit: Payer: Medicare Other | Admitting: Physical Therapy

## 2016-06-17 ENCOUNTER — Encounter: Payer: Self-pay | Admitting: Physical Therapy

## 2016-06-17 DIAGNOSIS — M5442 Lumbago with sciatica, left side: Secondary | ICD-10-CM

## 2016-06-17 DIAGNOSIS — M542 Cervicalgia: Secondary | ICD-10-CM | POA: Diagnosis not present

## 2016-06-17 DIAGNOSIS — M6281 Muscle weakness (generalized): Secondary | ICD-10-CM | POA: Diagnosis not present

## 2016-06-17 DIAGNOSIS — G8929 Other chronic pain: Secondary | ICD-10-CM | POA: Diagnosis not present

## 2016-06-17 DIAGNOSIS — M5441 Lumbago with sciatica, right side: Secondary | ICD-10-CM | POA: Diagnosis not present

## 2016-06-17 MED ORDER — BACLOFEN 20 MG PO TABS: 20.0000 mg | ORAL_TABLET | Freq: Three times a day (TID) | ORAL | 0 refills | Status: DC
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
## 2016-06-17 NOTE — Patient Instructions (Signed)
Piriformis Stretch - Supine    Pull uninvolved knee across body toward opposite shoulder. Hold slight stretch for _10__ seconds. Repeat with involved leg. Repeat _10__ times. Do __2_ times per day.  Copyright  VHI. All rights reserved.  Low Back Stretch: One leg (Supine)    Lying on back, bring one knee toward chest by pulling gently behind knee. Hold __10__ seconds. Repeat with other leg.  Copyright  VHI. All rights reserved.  Flexibility: Upper Trapezius Stretch    Gently grasp right side of head while reaching behind back with other hand. Tilt head away until a gentle stretch is felt. Hold __10__ seconds. Repeat __2__ times per set. Do __1__ sets per session. Do __2__ sessions per day.  http://orth.exer.us/341   Copyright  VHI. All rights reserved.  Amanda Turner, PTA 06/17/16 3:54 PM  [facility] [address], [place], [place] [address] Phone # [phone] Fax [phone]

## 2016-06-19 ENCOUNTER — Encounter: Payer: Self-pay | Admitting: Neurology

## 2016-06-19 ENCOUNTER — Ambulatory Visit: Payer: Medicare Other | Admitting: Physical Therapy

## 2016-06-19 ENCOUNTER — Encounter: Payer: Self-pay | Admitting: Physical Therapy

## 2016-06-19 ENCOUNTER — Encounter: Payer: Self-pay | Admitting: Internal Medicine

## 2016-06-19 ENCOUNTER — Other Ambulatory Visit: Payer: Self-pay | Admitting: Emergency Medicine

## 2016-06-19 DIAGNOSIS — M5441 Lumbago with sciatica, right side: Secondary | ICD-10-CM

## 2016-06-19 DIAGNOSIS — M5442 Lumbago with sciatica, left side: Secondary | ICD-10-CM

## 2016-06-19 DIAGNOSIS — G8929 Other chronic pain: Secondary | ICD-10-CM

## 2016-06-19 DIAGNOSIS — M6281 Muscle weakness (generalized): Secondary | ICD-10-CM

## 2016-06-19 DIAGNOSIS — M542 Cervicalgia: Secondary | ICD-10-CM

## 2016-06-19 MED ORDER — POTASSIUM CHLORIDE CRYS ER 20 MEQ PO TBCR: 20.0000 meq | EXTENDED_RELEASE_TABLET | Freq: Two times a day (BID) | ORAL | 3 refills | Status: DC

## 2016-06-19 MED ORDER — SIMVASTATIN 20 MG PO TABS: 20.0000 mg | ORAL_TABLET | Freq: Every day | ORAL | 3 refills | Status: DC

## 2016-06-19 MED ORDER — LISINOPRIL 5 MG PO TABS: ORAL_TABLET | ORAL | 3 refills | Status: DC
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
## 2016-06-23 ENCOUNTER — Encounter: Payer: Self-pay | Admitting: Family Medicine

## 2016-06-24 ENCOUNTER — Other Ambulatory Visit: Payer: Self-pay | Admitting: Internal Medicine

## 2016-06-24 ENCOUNTER — Encounter: Payer: Self-pay | Admitting: Physical Therapy

## 2016-06-24 ENCOUNTER — Encounter: Payer: Self-pay | Admitting: Family Medicine

## 2016-06-24 ENCOUNTER — Ambulatory Visit: Payer: Medicare Other | Admitting: Physical Therapy

## 2016-06-24 DIAGNOSIS — M6281 Muscle weakness (generalized): Secondary | ICD-10-CM

## 2016-06-24 DIAGNOSIS — M542 Cervicalgia: Secondary | ICD-10-CM | POA: Diagnosis not present

## 2016-06-24 DIAGNOSIS — M5441 Lumbago with sciatica, right side: Secondary | ICD-10-CM | POA: Diagnosis not present

## 2016-06-24 DIAGNOSIS — M5442 Lumbago with sciatica, left side: Secondary | ICD-10-CM

## 2016-06-24 DIAGNOSIS — G8929 Other chronic pain: Secondary | ICD-10-CM | POA: Diagnosis not present

## 2016-06-24 NOTE — Patient Instructions (Addendum)
Resisted Horizontal Abduction: Bilateral    Sit or stand, tubing in both hands, arms out in front. Keeping arms straight, pinch shoulder blades together and stretch arms out. Repeat __10__ times per set. Do _2___ sets per session. Do __1__ sessions per day.  http://orth.exer.us/969   Copyright  VHI. All rights reserved.  Shoulder Blade Squeeze    Rotate shoulders back, then squeeze shoulder blades together. Repeat _10___ times. Do __1__ sessions per day.  http://gt2.exer.us/847   Copyright  VHI. All rights reserved.  Abdominals: Single Leg Bend    Lying on back with legs out straight, inhale, then exhale while slowly sliding heel along floor toward buttocks. Slowly return to starting position. Repeat ____ times each leg per set. Do ____ sets per session. Do ____ sessions per day.  Copyright  VHI. All rights reserved.   Amanda Turner, PTA 06/24/16 4:22 PM  [facility] [address], [place], [place] [address] Phone # [phone] Fax [phone]
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
## 2016-06-26 ENCOUNTER — Ambulatory Visit: Payer: Medicare Other | Admitting: Physical Therapy

## 2016-06-26 ENCOUNTER — Encounter: Payer: Self-pay | Admitting: Physical Therapy

## 2016-06-26 DIAGNOSIS — G8929 Other chronic pain: Secondary | ICD-10-CM | POA: Diagnosis not present

## 2016-06-26 DIAGNOSIS — M6281 Muscle weakness (generalized): Secondary | ICD-10-CM

## 2016-06-26 DIAGNOSIS — M5442 Lumbago with sciatica, left side: Secondary | ICD-10-CM | POA: Diagnosis not present

## 2016-06-26 DIAGNOSIS — M5441 Lumbago with sciatica, right side: Secondary | ICD-10-CM | POA: Diagnosis not present

## 2016-06-26 DIAGNOSIS — M542 Cervicalgia: Secondary | ICD-10-CM | POA: Diagnosis not present
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
## 2016-06-28 ENCOUNTER — Encounter: Payer: Self-pay | Admitting: Internal Medicine

## 2016-06-29 NOTE — Addendum Note (Signed)
Addended by: Lovett Calender D on: 06/29/2016 04:09 PM   Modules accepted: Orders

## 2016-07-01 ENCOUNTER — Ambulatory Visit: Payer: Medicare Other | Admitting: Physical Therapy

## 2016-07-03 ENCOUNTER — Encounter: Payer: Self-pay | Admitting: Physical Therapy

## 2016-07-03 ENCOUNTER — Ambulatory Visit: Payer: Medicare Other | Attending: Internal Medicine | Admitting: Physical Therapy

## 2016-07-03 ENCOUNTER — Encounter: Payer: Self-pay | Admitting: Internal Medicine

## 2016-07-03 DIAGNOSIS — M542 Cervicalgia: Secondary | ICD-10-CM | POA: Diagnosis not present

## 2016-07-03 DIAGNOSIS — G8929 Other chronic pain: Secondary | ICD-10-CM | POA: Insufficient documentation

## 2016-07-03 DIAGNOSIS — M5441 Lumbago with sciatica, right side: Secondary | ICD-10-CM | POA: Insufficient documentation

## 2016-07-03 DIAGNOSIS — M5442 Lumbago with sciatica, left side: Secondary | ICD-10-CM | POA: Diagnosis not present

## 2016-07-03 DIAGNOSIS — M6281 Muscle weakness (generalized): Secondary | ICD-10-CM | POA: Insufficient documentation
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
## 2016-07-04 MED ORDER — DULOXETINE HCL 60 MG PO CPEP: 60.0000 mg | ORAL_CAPSULE | Freq: Every day | ORAL | 1 refills | Status: DC

## 2016-07-08 ENCOUNTER — Ambulatory Visit: Payer: Medicare Other | Admitting: Physical Therapy

## 2016-07-08 ENCOUNTER — Ambulatory Visit: Payer: Self-pay | Admitting: Family Medicine

## 2016-07-08 ENCOUNTER — Encounter: Payer: Self-pay | Admitting: Physical Therapy

## 2016-07-08 DIAGNOSIS — M6281 Muscle weakness (generalized): Secondary | ICD-10-CM | POA: Diagnosis not present

## 2016-07-08 DIAGNOSIS — M542 Cervicalgia: Secondary | ICD-10-CM

## 2016-07-08 DIAGNOSIS — M5441 Lumbago with sciatica, right side: Secondary | ICD-10-CM | POA: Diagnosis not present

## 2016-07-08 DIAGNOSIS — G8929 Other chronic pain: Secondary | ICD-10-CM

## 2016-07-08 DIAGNOSIS — M5442 Lumbago with sciatica, left side: Secondary | ICD-10-CM | POA: Diagnosis not present
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
## 2016-07-09 ENCOUNTER — Ambulatory Visit (INDEPENDENT_AMBULATORY_CARE_PROVIDER_SITE_OTHER): Payer: Medicare Other | Admitting: General Practice

## 2016-07-09 DIAGNOSIS — Z5181 Encounter for therapeutic drug level monitoring: Secondary | ICD-10-CM

## 2016-07-09 DIAGNOSIS — I639 Cerebral infarction, unspecified: Secondary | ICD-10-CM

## 2016-07-09 LAB — POCT INR: INR: 2.3

## 2016-07-09 NOTE — Progress Notes (Signed)
I agree with this plan.

## 2016-07-09 NOTE — Patient Instructions (Signed)
Pre visit review using our clinic review tool, if applicable. No additional management support is needed unless otherwise documented below in the visit note. 

## 2016-07-10 ENCOUNTER — Encounter: Payer: Self-pay | Admitting: Physical Therapy

## 2016-07-14 ENCOUNTER — Encounter: Payer: Self-pay | Admitting: Oncology

## 2016-07-15 ENCOUNTER — Ambulatory Visit: Payer: Medicare Other | Admitting: Physical Therapy
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
## 2016-07-16 ENCOUNTER — Ambulatory Visit: Payer: Self-pay | Admitting: Family Medicine
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
## 2016-07-17 ENCOUNTER — Encounter: Payer: Self-pay | Admitting: Physical Therapy

## 2016-07-20 ENCOUNTER — Encounter: Payer: Self-pay | Admitting: Internal Medicine

## 2016-07-21 MED ORDER — FUROSEMIDE 40 MG PO TABS: 40.0000 mg | ORAL_TABLET | Freq: Every day | ORAL | 1 refills | Status: DC

## 2016-07-22 ENCOUNTER — Encounter: Payer: Self-pay | Admitting: Physical Therapy

## 2016-07-22 ENCOUNTER — Ambulatory Visit: Payer: Medicare Other | Admitting: Physical Therapy

## 2016-07-22 DIAGNOSIS — M5442 Lumbago with sciatica, left side: Secondary | ICD-10-CM

## 2016-07-22 DIAGNOSIS — M542 Cervicalgia: Secondary | ICD-10-CM

## 2016-07-22 DIAGNOSIS — M5441 Lumbago with sciatica, right side: Secondary | ICD-10-CM | POA: Diagnosis not present

## 2016-07-22 DIAGNOSIS — M6281 Muscle weakness (generalized): Secondary | ICD-10-CM | POA: Diagnosis not present

## 2016-07-22 DIAGNOSIS — G8929 Other chronic pain: Secondary | ICD-10-CM | POA: Diagnosis not present

## 2016-07-22 NOTE — Patient Instructions (Signed)
NECK TENSION: Extension Stretch    Bring both hands to shoulders by sides of neck, fingers facing back. Inhaling, pull shoulders forward, and look up. Exhaling, bring head back to center. Repeat _2__ times. Do _2__ times per day.  Copyright  VHI. All rights reserved.   CHEST: Doorway, Bilateral - Standing    Standing in doorway, place hands on wall with elbows bent at shoulder height. Lean forward. Hold __10_ seconds. __2_ reps per set, __3_ times per day.  Copyright  VHI. All rights reserved.   Mikle Bosworth, PTA 07/22/16 5:01 PM  [facility] [address] [place], [place] [address] Phone # [phone] Fax [phone]
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
## 2016-07-24 ENCOUNTER — Ambulatory Visit: Payer: Medicare Other | Admitting: Physical Therapy

## 2016-07-24 ENCOUNTER — Encounter: Payer: Self-pay | Admitting: Physical Therapy

## 2016-07-24 DIAGNOSIS — M5441 Lumbago with sciatica, right side: Secondary | ICD-10-CM

## 2016-07-24 DIAGNOSIS — M542 Cervicalgia: Secondary | ICD-10-CM

## 2016-07-24 DIAGNOSIS — G8929 Other chronic pain: Secondary | ICD-10-CM

## 2016-07-24 DIAGNOSIS — M5442 Lumbago with sciatica, left side: Secondary | ICD-10-CM | POA: Diagnosis not present

## 2016-07-24 DIAGNOSIS — M6281 Muscle weakness (generalized): Secondary | ICD-10-CM | POA: Diagnosis not present
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
## 2016-07-25 ENCOUNTER — Encounter: Payer: Self-pay | Admitting: Internal Medicine

## 2016-07-29 ENCOUNTER — Encounter: Payer: Self-pay | Admitting: Internal Medicine

## 2016-07-29 ENCOUNTER — Encounter: Payer: Self-pay | Admitting: Physical Therapy

## 2016-07-29 DIAGNOSIS — S62102D Fracture of unspecified carpal bone, left wrist, subsequent encounter for fracture with routine healing: Secondary | ICD-10-CM

## 2016-07-31 ENCOUNTER — Encounter: Payer: Self-pay | Admitting: Physical Therapy

## 2016-07-31 ENCOUNTER — Ambulatory Visit: Payer: Medicare Other | Admitting: Physical Therapy

## 2016-07-31 DIAGNOSIS — G8929 Other chronic pain: Secondary | ICD-10-CM | POA: Diagnosis not present

## 2016-07-31 DIAGNOSIS — M542 Cervicalgia: Secondary | ICD-10-CM

## 2016-07-31 DIAGNOSIS — M6281 Muscle weakness (generalized): Secondary | ICD-10-CM

## 2016-07-31 DIAGNOSIS — M5442 Lumbago with sciatica, left side: Secondary | ICD-10-CM

## 2016-07-31 DIAGNOSIS — M5441 Lumbago with sciatica, right side: Secondary | ICD-10-CM | POA: Diagnosis not present
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
## 2016-08-03 ENCOUNTER — Encounter: Payer: Self-pay | Admitting: Internal Medicine

## 2016-08-04 ENCOUNTER — Ambulatory Visit (INDEPENDENT_AMBULATORY_CARE_PROVIDER_SITE_OTHER)
Admission: RE | Admit: 2016-08-04 | Discharge: 2016-08-04 | Disposition: A | Discharge status: Home / Self Care | Payer: Medicare Other | Source: Ambulatory Visit | Attending: Internal Medicine | Admitting: Internal Medicine

## 2016-08-04 ENCOUNTER — Encounter: Payer: Self-pay | Admitting: Internal Medicine

## 2016-08-04 ENCOUNTER — Other Ambulatory Visit (INDEPENDENT_AMBULATORY_CARE_PROVIDER_SITE_OTHER): Payer: Medicare Other

## 2016-08-04 ENCOUNTER — Encounter: Payer: Self-pay | Admitting: Family Medicine

## 2016-08-04 DIAGNOSIS — I10 Essential (primary) hypertension: Secondary | ICD-10-CM

## 2016-08-04 DIAGNOSIS — R739 Hyperglycemia, unspecified: Secondary | ICD-10-CM | POA: Diagnosis not present

## 2016-08-04 DIAGNOSIS — S62102D Fracture of unspecified carpal bone, left wrist, subsequent encounter for fracture with routine healing: Secondary | ICD-10-CM | POA: Diagnosis not present

## 2016-08-04 DIAGNOSIS — M25532 Pain in left wrist: Secondary | ICD-10-CM | POA: Diagnosis not present

## 2016-08-04 DIAGNOSIS — E7800 Pure hypercholesterolemia, unspecified: Secondary | ICD-10-CM | POA: Diagnosis not present

## 2016-08-04 DIAGNOSIS — S6992XA Unspecified injury of left wrist, hand and finger(s), initial encounter: Secondary | ICD-10-CM | POA: Diagnosis not present

## 2016-08-04 LAB — CBC WITH DIFFERENTIAL/PLATELET
BASOS PCT: 1.4 % (ref 0.0–3.0)
Basophils Absolute: 0.1 10*3/uL (ref 0.0–0.1)
EOS ABS: 0.1 10*3/uL (ref 0.0–0.7)
EOS PCT: 2.1 % (ref 0.0–5.0)
HEMATOCRIT: 44.3 % (ref 36.0–46.0)
Hemoglobin: 15.1 g/dL — ABNORMAL HIGH (ref 12.0–15.0)
LYMPHS PCT: 35.9 % (ref 12.0–46.0)
Lymphs Abs: 2.2 10*3/uL (ref 0.7–4.0)
MCHC: 34.1 g/dL (ref 30.0–36.0)
MCV: 93.5 fl (ref 78.0–100.0)
MONOS PCT: 10.6 % (ref 3.0–12.0)
Monocytes Absolute: 0.7 10*3/uL (ref 0.1–1.0)
NEUTROS ABS: 3.1 10*3/uL (ref 1.4–7.7)
Neutrophils Relative %: 50 % (ref 43.0–77.0)
PLATELETS: 239 10*3/uL (ref 150.0–400.0)
RBC: 4.74 Mil/uL (ref 3.87–5.11)
RDW: 12.5 % (ref 11.5–15.5)
WBC: 6.1 10*3/uL (ref 4.0–10.5)

## 2016-08-04 LAB — COMPREHENSIVE METABOLIC PANEL
ALT: 45 U/L — ABNORMAL HIGH (ref 0–35)
AST: 25 U/L (ref 0–37)
Albumin: 4.1 g/dL (ref 3.5–5.2)
Alkaline Phosphatase: 87 U/L (ref 39–117)
BUN: 16 mg/dL (ref 6–23)
CALCIUM: 9.5 mg/dL (ref 8.4–10.5)
CHLORIDE: 105 meq/L (ref 96–112)
CO2: 30 mEq/L (ref 19–32)
CREATININE: 0.77 mg/dL (ref 0.40–1.20)
GFR: 78.7 mL/min (ref >60.00)
Glucose, Bld: 120 mg/dL — ABNORMAL HIGH (ref 70–99)
Potassium: 3.8 mEq/L (ref 3.5–5.1)
Sodium: 141 mEq/L (ref 135–145)
Total Bilirubin: 0.6 mg/dL (ref 0.2–1.2)
Total Protein: 7.1 g/dL (ref 6.0–8.3)

## 2016-08-04 LAB — LIPID PANEL
CHOLESTEROL: 205 mg/dL — AB (ref 0–200)
HDL: 71.2 mg/dL (ref >39.00)
LDL CALC: 107 mg/dL — AB (ref 0–99)
NonHDL: 133.62
TRIGLYCERIDES: 132 mg/dL (ref 0.0–149.0)
Total CHOL/HDL Ratio: 3
VLDL: 26.4 mg/dL (ref 0.0–40.0)

## 2016-08-04 LAB — HEMOGLOBIN A1C: Hgb A1c MFr Bld: 5.7 % (ref 4.6–6.5)

## 2016-08-05 MED ORDER — SERTRALINE HCL 50 MG PO TABS: 50.0000 mg | ORAL_TABLET | Freq: Every day | ORAL | 3 refills | Status: DC

## 2016-08-06 ENCOUNTER — Encounter: Payer: Self-pay | Admitting: Internal Medicine

## 2016-08-06 ENCOUNTER — Ambulatory Visit (INDEPENDENT_AMBULATORY_CARE_PROVIDER_SITE_OTHER): Payer: Medicare Other | Admitting: Internal Medicine

## 2016-08-06 VITALS — BP 104/70 | HR 72 | Temp 97.6°F | Resp 16 | Ht 64.0 in | Wt 197.0 lb

## 2016-08-06 DIAGNOSIS — R7303 Prediabetes: Secondary | ICD-10-CM

## 2016-08-06 DIAGNOSIS — F32A Depression, unspecified: Secondary | ICD-10-CM

## 2016-08-06 DIAGNOSIS — E7800 Pure hypercholesterolemia, unspecified: Secondary | ICD-10-CM | POA: Diagnosis not present

## 2016-08-06 DIAGNOSIS — F419 Anxiety disorder, unspecified: Secondary | ICD-10-CM | POA: Diagnosis not present

## 2016-08-06 DIAGNOSIS — M5412 Radiculopathy, cervical region: Secondary | ICD-10-CM

## 2016-08-06 DIAGNOSIS — I10 Essential (primary) hypertension: Secondary | ICD-10-CM

## 2016-08-06 DIAGNOSIS — F329 Major depressive disorder, single episode, unspecified: Secondary | ICD-10-CM | POA: Diagnosis not present

## 2016-08-06 NOTE — Assessment & Plan Note (Signed)
Chronic pain in neck and left jaw Did not tolerate gabapentin or baclofen or cymbalta Doing PT Will discuss with Dr Tamala Julian  May need to see orthopedics / pain management

## 2016-08-06 NOTE — Assessment & Plan Note (Signed)
a1c 5.7% Decrease sugar/carb intake Increase exercise Work on weight loss

## 2016-08-06 NOTE — Assessment & Plan Note (Signed)
BP well controlled Current regimen effective and well tolerated Continue current medications at current doses  

## 2016-08-06 NOTE — Assessment & Plan Note (Signed)
Will start zoloft today

## 2016-08-06 NOTE — Progress Notes (Signed)
Subjective:    Patient ID: Amanda Turner, female    DOB: 1945/06/23, 71 y.o.   MRN: [id number]  HPI The patient is here for follow up.  Hypertension: She is taking her medication daily. She is not compliant with a low sodium diet.  She denies chest pain, palpitations, shortness of breath. She is exercising minimally.    Prediabetes:  She is not compliant with a low sugar/carbohydrate diet.  She is exercising minimally.  Hyperlipidemia: She is taking her medication daily. She is compliant with a low fat/cholesterol diet. She is exercising minimally.    Anxiety, depression, PTSD:  We just recently started her on zoloft, but she has not picked it up from the pharmacy.  She did not tolerate cymbalta.  Chronic neck, jaw muscle tightness:  She is doing PT.  The therapy is helping, but it is temporary.  The pain and tightness in her left jaw, neck is constant and causes headaches.  She did not tolerate gabapentin or baclofen.    Medications and allergies reviewed with patient and updated if appropriate.  Patient Active Problem List   Diagnosis Date Noted  . Prediabetes 08/06/2016  . Chest pain 05/07/2016  . Subluxation of extensor carpi ulnaris tendon, left, initial encounter 03/24/2016  . Left lumbar radiculopathy 02/12/2016  . Post-concussion headache 01/25/2016  . Achilles tendinosis 01/17/2016  . Left wrist pain 01/17/2016  . Pain of left heel 12/26/2015  . Temporomandibular joint (TMJ) pain 12/13/2015  . Left cervical radiculopathy 12/13/2015  . Low back pain 12/13/2015  . Leg cramps 12/13/2015  . Anxiety 10/23/2015  . Gait instability 10/23/2015  . Edema 10/23/2015  . Encounter for therapeutic drug monitoring 10/30/2014  . Carotid stenosis 09/06/2012  . Metabolic syndrome 05/07/7251  . Lupus anticoagulant with hypercoagulable state ([place]) 06/23/2011  . CVA (cerebrovascular accident) ([place]) 03/06/2011  . Hematuria 02/11/2010  . 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  Hyperlipidemia 12/04/2006  . Depression 12/04/2006  . Essential hypertension 12/04/2006    Current Outpatient Prescriptions on File Prior to Visit  Medication Sig Dispense Refill  . ALPRAZolam (XANAX) 0.5 MG tablet Take 0.5 tab in morning and one tab in evening 135 tablet 1  . aspirin 81 MG tablet Take 81 mg by mouth daily.      . bifidobacterium infantis (ALIGN) capsule Take 1 capsule by mouth daily. 7 capsule 0  . furosemide (LASIX) 40 MG tablet Take 1 tablet (40 mg total) by mouth daily. 90 tablet 1  . lisinopril (PRINIVIL,ZESTRIL) 5 MG tablet TAKE 1 TABLET (5 MG TOTAL) BY MOUTH DAILY. 90 tablet 3  . potassium chloride SA (KLOR-CON M20) 20 MEQ tablet Take 1 tablet (20 mEq total) by mouth 2 (two) times daily. 180 tablet 3  . sertraline (ZOLOFT) 50 MG tablet Take 1 tablet (50 mg total) by mouth at bedtime. 30 tablet 3  . simvastatin (ZOCOR) 20 MG tablet Take 1 tablet (20 mg total) by mouth at bedtime. 90 tablet 3  . Vitamin D, Ergocalciferol, (DRISDOL) 50000 units CAPS capsule Take 1 capsule (50,000 Units total) by mouth every 7 (seven) days. 12 capsule 0  . warfarin (COUMADIN) 5 MG tablet Take 1 tablet (5 mg total) by mouth daily. 90 tablet 1   No current facility-administered medications on file prior to visit.     Past Medical History:  Diagnosis Date  . Anxiety   . 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 Depression   . Elevated  LFT's   . HA (headache)   . Hyperlipemia   . Hypertension   . Intracranial hemorrhage, spontaneous intraparenchymal, associated with coagulopathy, remote, resolved 06/23/2011  . Lupus anticoagulant disorder ([place])   . Lupus anticoagulant with hypercoagulable state ([place]) 06/23/2011  . Lupus erythematosus 06/23/2011  . Stroke [facility])     Past Surgical History:  Procedure Laterality Date  . ABDOMINAL HYSTERECTOMY    . TRANSPHENOIDAL PITUITARY RESECTION      Social History   Social History  . Marital status:  Married    Spouse name: N/A  . Number of children: 2  . Years of education: N/A   Occupational History  .  Unemployed   Social History Main Topics  . Smoking status: Never Smoker  . Smokeless tobacco: Never Used  . Alcohol use No  . Drug use: No  . Sexual activity: Not on file   Other Topics Concern  . Not on file   Social History Narrative   2 cups of coffee daily   Retired       Family History  Problem Relation Age of Onset  . Diabetes Brother   . Diabetes Sister   . Hypertension Mother   . Dementia Mother   . Bladder Cancer Brother   . Colon cancer Neg Hx     Review of Systems  Constitutional: Negative for chills and fever.  Eyes: Positive for visual disturbance (blurry vision).  Respiratory: Negative for cough, shortness of breath and wheezing.   Cardiovascular: Positive for leg swelling (mild in feet). Negative for chest pain and palpitations.  Musculoskeletal: Positive for myalgias and neck pain.  Neurological: Positive for dizziness, light-headedness and headaches.       Objective:   Vitals:   08/06/16 1514  BP: 104/70  Pulse: 72  Resp: 16  Temp: 97.6 F (36.4 C)   Wt Readings from Last 3 Encounters:  08/06/16 197 lb (89.4 kg)  06/11/16 197 lb (89.4 kg)  05/07/16 194 lb (88 kg)   Body mass index is 33.81 kg/m.   Physical Exam    Constitutional: Appears well-developed and well-nourished. No distress.  HENT:  Head: Normocephalic and atraumatic.  Neck: Neck supple. No tracheal deviation present. No thyromegaly present.  No cervical lymphadenopathy Cardiovascular: Normal rate, regular rhythm and normal heart sounds.   No murmur heard. No carotid bruit .  No edema Pulmonary/Chest: Effort normal and breath sounds normal. No respiratory distress. No has no wheezes. No rales.  Msk: tenderness in left jaw muscles, left anterior neck and posterior neck with palpation Skin: Skin is warm and dry. Not diaphoretic.  Psychiatric: Normal mood and  affect. Behavior is normal.      Assessment & Plan:    See Problem List for Assessment and Plan of chronic medical problems.   FU in 3 months

## 2016-08-06 NOTE — Assessment & Plan Note (Signed)
Xanax daily Will start zoloft today

## 2016-08-06 NOTE — Progress Notes (Signed)
Pre visit review using our clinic review tool, if applicable. No additional management support is needed unless otherwise documented below in the visit note. 

## 2016-08-06 NOTE — Assessment & Plan Note (Signed)
lipid panel controlled Continue daily statin Regular exercise and healthy diet encouraged  

## 2016-08-06 NOTE — Patient Instructions (Addendum)
  All other Health Maintenance issues reviewed.   All recommended immunizations and age-appropriate screenings are up-to-date or discussed.  No immunizations administered today.   Medications reviewed and updated.  No changes recommended at this time.   Please followup in 3 months   

## 2016-08-07 ENCOUNTER — Ambulatory Visit: Payer: Medicare Other | Attending: Internal Medicine | Admitting: Physical Therapy

## 2016-08-07 ENCOUNTER — Encounter: Payer: Self-pay | Admitting: Physical Therapy

## 2016-08-07 DIAGNOSIS — M5442 Lumbago with sciatica, left side: Secondary | ICD-10-CM | POA: Insufficient documentation

## 2016-08-07 DIAGNOSIS — G8929 Other chronic pain: Secondary | ICD-10-CM

## 2016-08-07 DIAGNOSIS — M5441 Lumbago with sciatica, right side: Secondary | ICD-10-CM | POA: Insufficient documentation

## 2016-08-07 DIAGNOSIS — M542 Cervicalgia: Secondary | ICD-10-CM

## 2016-08-07 DIAGNOSIS — M6281 Muscle weakness (generalized): Secondary | ICD-10-CM
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
## 2016-08-12 ENCOUNTER — Encounter: Payer: Self-pay | Admitting: Physical Therapy

## 2016-08-14 ENCOUNTER — Encounter: Payer: Self-pay | Admitting: Physical Therapy

## 2016-08-17 ENCOUNTER — Encounter: Payer: Self-pay | Admitting: Internal Medicine

## 2016-08-19 ENCOUNTER — Encounter: Payer: Self-pay | Admitting: Family Medicine

## 2016-08-19 ENCOUNTER — Encounter: Payer: Self-pay | Admitting: Internal Medicine

## 2016-08-19 ENCOUNTER — Ambulatory Visit (INDEPENDENT_AMBULATORY_CARE_PROVIDER_SITE_OTHER): Payer: Medicare Other | Admitting: Family Medicine

## 2016-08-19 ENCOUNTER — Encounter: Payer: Self-pay | Admitting: Physical Therapy

## 2016-08-19 VITALS — BP 136/76 | HR 62 | Resp 16 | Wt 198.0 lb

## 2016-08-19 DIAGNOSIS — M5412 Radiculopathy, cervical region: Secondary | ICD-10-CM | POA: Diagnosis not present

## 2016-08-19 MED ORDER — METHYLPREDNISOLONE ACETATE 80 MG/ML IJ SUSP
80.0000 mg | Freq: Once | INTRAMUSCULAR | Status: AC
  Administered 2016-08-19: 80 mg via INTRAMUSCULAR

## 2016-08-19 MED ORDER — KETOROLAC TROMETHAMINE 60 MG/2ML IM SOLN
60.0000 mg | Freq: Once | INTRAMUSCULAR | Status: AC
  Administered 2016-08-19: 60 mg via INTRAMUSCULAR

## 2016-08-19 NOTE — Patient Instructions (Signed)
Good to see you  We will give you 2 injections today to help with the pain  We will get EMG and see if more from the neck or more os a reflux sympathetic dystrophy I will write you when I get the results and we will decide next step either medicines or consider another MRI of your neck.  I really hope we will find an answer soon.

## 2016-08-19 NOTE — Progress Notes (Signed)
Pre-visit discussion using our clinic review tool. No additional management support is needed unless otherwise documented below in the visit note.  

## 2016-08-19 NOTE — Assessment & Plan Note (Signed)
Patient does have more of a left-sided cervical radiculopathy. As the possibility of another advance imaging will start with an EMG. This will help [place] no's is more post stroke related, reflexive that the dystrophy secondary to the exiting, or even if it is cervical radiculopathy. Patient will have this done and depending on findings this will help [place] decide which medical management. 2 injections given today. Continue vitamin D. We discussed the possibility of an MRI that could be necessary for the neck as well.  Spent  25 minutes with patient face-to-face and had greater than 50% of counseling including as described above in assessment and plan.
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
## 2016-08-20 ENCOUNTER — Ambulatory Visit (INDEPENDENT_AMBULATORY_CARE_PROVIDER_SITE_OTHER): Payer: Medicare Other | Admitting: *Deleted

## 2016-08-20 ENCOUNTER — Encounter: Payer: Self-pay | Admitting: Family Medicine

## 2016-08-20 DIAGNOSIS — Z5181 Encounter for therapeutic drug level monitoring: Secondary | ICD-10-CM | POA: Diagnosis not present

## 2016-08-20 DIAGNOSIS — I639 Cerebral infarction, unspecified: Secondary | ICD-10-CM

## 2016-08-20 LAB — POCT INR: INR: 2.2

## 2016-08-20 NOTE — Patient Instructions (Signed)
Pre visit review using our clinic review tool, if applicable. No additional management support is needed unless otherwise documented below in the visit note. 

## 2016-08-20 NOTE — Progress Notes (Signed)
I agree with this plan.

## 2016-08-21 ENCOUNTER — Telehealth: Payer: Self-pay | Admitting: Internal Medicine

## 2016-08-21 ENCOUNTER — Encounter: Payer: Self-pay | Admitting: Physical Therapy

## 2016-08-21 ENCOUNTER — Encounter: Payer: Self-pay | Admitting: Internal Medicine

## 2016-08-21 MED ORDER — DULOXETINE HCL 30 MG PO CPEP: 30.0000 mg | ORAL_CAPSULE | Freq: Every day | ORAL | 1 refills | Status: DC

## 2016-08-21 NOTE — Telephone Encounter (Signed)
Pt would like a refill Cymbalta, Ok per [employer] in [place] 30mg  sent to [facility] on [address]

## 2016-08-21 NOTE — Telephone Encounter (Signed)
LVM informing pt that RX was sent to [facility]

## 2016-08-22 ENCOUNTER — Other Ambulatory Visit: Payer: Self-pay | Admitting: *Deleted

## 2016-08-22 DIAGNOSIS — M5412 Radiculopathy, cervical region: Secondary | ICD-10-CM

## 2016-08-26 ENCOUNTER — Ambulatory Visit: Payer: Self-pay | Admitting: Physical Therapy

## 2016-08-28 ENCOUNTER — Encounter: Payer: Self-pay | Admitting: Physical Therapy

## 2016-09-01 ENCOUNTER — Encounter: Payer: Self-pay | Admitting: Internal Medicine

## 2016-09-01 ENCOUNTER — Encounter: Payer: Self-pay | Admitting: Neurology

## 2016-09-02 ENCOUNTER — Other Ambulatory Visit: Payer: Self-pay | Admitting: General Practice

## 2016-09-02 MED ORDER — WARFARIN SODIUM 5 MG PO TABS: 5.0000 mg | ORAL_TABLET | Freq: Every day | ORAL | 1 refills | Status: DC

## 2016-09-09 ENCOUNTER — Encounter: Payer: Self-pay | Admitting: Family Medicine

## 2016-09-09 ENCOUNTER — Ambulatory Visit (INDEPENDENT_AMBULATORY_CARE_PROVIDER_SITE_OTHER): Payer: Medicare Other | Admitting: Neurology

## 2016-09-09 DIAGNOSIS — M5412 Radiculopathy, cervical region: Secondary | ICD-10-CM | POA: Diagnosis not present

## 2016-09-09 NOTE — Procedures (Signed)
[facility]  [address]  [place], [place] [address] Tel: [phone] Fax:  [phone] Test Date:  09/09/2016  Patient: Amanda Turner DOB: 11/25/1945 Physician: Narda Amber, DO  Sex: Female Height: 5\' 4"  Ref Phys: Hulan Saas, D.O.  ID#: [id number] Temp: 32.0C Technician:    Patient Complaints: This is a 71 year-old female referred for evaluation of left arm radicular symptoms.  NCV & EMG Findings: Extensive electrodiagnostic testing of the left upper extremity shows:  1. Left median, ulnar, and mixed palmer sensory responses are within normal limits. 2. Left median and ulnar motor responses are within normal limits. 3. Sparse chronic motor axon loss changes are seen affecting the deltoid and infraspinatus muscle left. There is no evidence of accompanied active denervation.  Impression: 1. Chronic C5 radiculopathy affecting the left upper extremity; very mild in degree electrically. 2. There is no evidence of carpal tunnel syndrome or cubital tunnel syndrome affecting the left upper extremity.   ___________________________ Narda Amber, DO    Nerve Conduction Studies Anti Sensory Summary Table   Site NR Peak (ms) Norm Peak (ms) P-T Amp (V) Norm P-T Amp  Left Median Anti Sensory (2nd Digit)  32C  Wrist    2.9 <3.8 46.2 >10  Left Ulnar Anti Sensory (5th Digit)  32C  Wrist    2.4 <3.2 27.1 >5   Motor Summary Table   Site NR Onset (ms) Norm Onset (ms) O-P Amp (mV) Norm O-P Amp Site1 Site2 Delta-0 (ms) Dist (cm) Vel (m/s) Norm Vel (m/s)  Left Median Motor (Abd Poll Brev)  32C  Wrist    2.7 <4.0 7.7 >5 Elbow Wrist 4.2 26.0 62 >50  Elbow    6.9  7.3         Left Ulnar Motor (Abd Dig Minimi)  32C  Wrist    2.2 <3.1 9.3 >7 B Elbow Wrist 3.4 21.0 62 >50  B Elbow    5.6  9.1  A Elbow B Elbow 1.7 10.0 59 >50  A Elbow    7.3  8.8          Comparison Summary Table   Site NR Peak (ms) Norm Peak (ms) P-T Amp (V) Site1 Site2 Delta-P (ms) Norm  Delta (ms)  Left Median/Ulnar Palm Comparison (Wrist - 8cm)  32C  Median Palm    1.7 <2.2 55.0 Median Palm Ulnar Palm 0.2   Ulnar Palm    1.5 <2.2 24.0       EMG   Side Muscle Ins Act Fibs Psw Fasc Number Recrt Dur Dur. Amp Amp. Poly Poly. Comment  Left 1stDorInt Nml Nml Nml Nml Nml Nml Nml Nml Nml Nml Nml Nml N/A  Left Ext Indicis Nml Nml Nml Nml Nml Nml Nml Nml Nml Nml Nml Nml N/A  Left PronatorTeres Nml Nml Nml Nml Nml Nml Nml Nml Nml Nml Nml Nml N/A  Left Biceps Nml Nml Nml Nml Nml Nml Nml Nml Nml Nml Nml Nml N/A  Left Triceps Nml Nml Nml Nml Nml Nml Nml Nml Nml Nml Nml Nml N/A  Left Deltoid Nml Nml Nml Nml 1- Rapid Few 1+ Few 1+ Nml Nml N/A  Left Infraspinatus Nml Nml Nml Nml 1- Mod-R Few 1+ Few 1+ Nml Nml N/A      Waveforms:

## 2016-09-10 ENCOUNTER — Telehealth: Payer: Self-pay | Admitting: Internal Medicine

## 2016-09-10 NOTE — Telephone Encounter (Signed)
Pt called regarding Mychart email . Would like call back.    Hello Dr. Tamala Julian.The results of the EMG seems normal?What do you mean the pain is from the neck. If you can please, explain, it would be appreciated. Thanks.  Amanda Turner

## 2016-09-11 ENCOUNTER — Encounter: Payer: Self-pay | Admitting: Neurology

## 2016-09-11 ENCOUNTER — Encounter: Payer: Self-pay | Admitting: Internal Medicine

## 2016-09-11 DIAGNOSIS — G8929 Other chronic pain: Secondary | ICD-10-CM

## 2016-09-11 DIAGNOSIS — M542 Cervicalgia: Principal | ICD-10-CM

## 2016-09-12 ENCOUNTER — Encounter: Payer: Self-pay | Admitting: Family Medicine

## 2016-09-16 ENCOUNTER — Encounter: Payer: Self-pay | Admitting: Neurology

## 2016-09-23 DIAGNOSIS — M5000 Cervical disc disorder with myelopathy, unspecified cervical region: Secondary | ICD-10-CM | POA: Diagnosis not present

## 2016-09-23 DIAGNOSIS — M5106 Intervertebral disc disorders with myelopathy, lumbar region: Secondary | ICD-10-CM | POA: Diagnosis not present

## 2016-09-23 DIAGNOSIS — S63502A Unspecified sprain of left wrist, initial encounter: Secondary | ICD-10-CM | POA: Diagnosis not present

## 2016-09-24 ENCOUNTER — Ambulatory Visit (INDEPENDENT_AMBULATORY_CARE_PROVIDER_SITE_OTHER): Payer: Medicare Other | Admitting: General Practice

## 2016-09-24 DIAGNOSIS — Z5181 Encounter for therapeutic drug level monitoring: Secondary | ICD-10-CM

## 2016-09-24 LAB — POCT INR: INR: 1.8

## 2016-09-24 NOTE — Patient Instructions (Signed)
Pre visit review using our clinic review tool, if applicable. No additional management support is needed unless otherwise documented below in the visit note. 

## 2016-09-25 ENCOUNTER — Encounter: Payer: Self-pay | Admitting: Internal Medicine

## 2016-09-25 NOTE — Progress Notes (Signed)
I agree with this plan.

## 2016-09-28 ENCOUNTER — Encounter: Payer: Self-pay | Admitting: Internal Medicine

## 2016-10-01 ENCOUNTER — Encounter: Payer: Self-pay | Admitting: Internal Medicine

## 2016-10-03 ENCOUNTER — Encounter: Payer: Self-pay | Admitting: Family Medicine

## 2016-10-06 DIAGNOSIS — H2513 Age-related nuclear cataract, bilateral: Secondary | ICD-10-CM | POA: Diagnosis not present

## 2016-10-07 NOTE — Telephone Encounter (Signed)
Pt would like the script  Ibuprofen sent in.     Pharmacy [facility] [place]

## 2016-10-08 DIAGNOSIS — I69398 Other sequelae of cerebral infarction: Secondary | ICD-10-CM | POA: Diagnosis not present

## 2016-10-08 DIAGNOSIS — M545 Low back pain: Secondary | ICD-10-CM | POA: Diagnosis not present

## 2016-10-08 DIAGNOSIS — M542 Cervicalgia: Secondary | ICD-10-CM | POA: Diagnosis not present

## 2016-10-10 ENCOUNTER — Encounter: Payer: Self-pay | Admitting: Internal Medicine

## 2016-10-14 ENCOUNTER — Encounter: Payer: Self-pay | Admitting: Neurology

## 2016-10-21 ENCOUNTER — Encounter: Payer: Self-pay | Admitting: Internal Medicine

## 2016-10-22 ENCOUNTER — Ambulatory Visit (INDEPENDENT_AMBULATORY_CARE_PROVIDER_SITE_OTHER): Payer: Medicare Other | Admitting: General Practice

## 2016-10-22 DIAGNOSIS — Z5181 Encounter for therapeutic drug level monitoring: Secondary | ICD-10-CM

## 2016-10-22 DIAGNOSIS — I639 Cerebral infarction, unspecified: Secondary | ICD-10-CM | POA: Diagnosis not present

## 2016-10-22 LAB — POCT INR: INR: 2.1

## 2016-10-22 NOTE — Progress Notes (Signed)
I agree with this plan.

## 2016-10-22 NOTE — Patient Instructions (Signed)
Pre visit review using our clinic review tool, if applicable. No additional management support is needed unless otherwise documented below in the visit note. 

## 2016-10-23 ENCOUNTER — Encounter: Payer: Self-pay | Admitting: Internal Medicine

## 2016-10-23 ENCOUNTER — Encounter: Payer: Self-pay | Admitting: Family Medicine

## 2016-10-23 ENCOUNTER — Other Ambulatory Visit: Payer: Self-pay | Admitting: Emergency Medicine

## 2016-10-23 MED ORDER — ALPRAZOLAM 0.5 MG PO TABS: ORAL_TABLET | ORAL | 0 refills | Status: DC

## 2016-10-23 NOTE — Telephone Encounter (Signed)
RX faxed to Mail order 

## 2016-10-25 ENCOUNTER — Encounter: Payer: Self-pay | Admitting: Internal Medicine

## 2016-10-26 ENCOUNTER — Encounter: Payer: Self-pay | Admitting: Internal Medicine

## 2016-10-26 NOTE — Progress Notes (Signed)
Subjective:    Patient ID: Amanda Turner, female    DOB: [birth year], 71 y.o.   MRN: [id number]  HPI She is here for an acute visit.   Dr Lynann Bologna did not see any role for interventions/injections.  She did recommend medication and chiropractor/accupuncture.    Neck pain, jaw pain, left arm pain:  She did see Dr Lynann Bologna and Dr Tamala Julian.  She has also seen ENT.  She will be seeing Dr Tomi Likens in September.  She has done trigger point injection and PT which have not helped.  She has pain in the left side of her face.  Her jaw is slightly swollen.  She has numbness/tingling in the V3 distribution on the left face.  Eating anything hard increases her pain.  She has to eat soft food.  Talking more causes pain.  She has increased headaches.  She has neck pain and left arm pain.  Lower back pain, left leg pain and weakness:  She is seeing Dr Lynann Bologna.  She has pain in her lower back and left leg.  Her leg is weak.  She has difficulty with balance.  She was on tramadol as needed in the recent past and it helped.  She would only take it as needed.    Anxiety, depression:  She is taking the cymbalta and can not tolerate a higher dose.  She does not feel it is helping with her pain.  She still has some depression and anxiety.  She is seeing a therapist.     Medications and allergies reviewed with patient and updated if appropriate.  Patient Active Problem List   Diagnosis Date Noted  . Prediabetes 08/06/2016  . Chest pain 05/07/2016  . Subluxation of extensor carpi ulnaris tendon, left, initial encounter 03/24/2016  . Left lumbar radiculopathy 02/12/2016  . Post-concussion headache 01/25/2016  . Achilles tendinosis 01/17/2016  . Left wrist pain 01/17/2016  . Pain of left heel 12/26/2015  . Left cervical radiculopathy 12/13/2015  . Low back pain 12/13/2015  . Leg cramps 12/13/2015  . Anxiety 10/23/2015  . Gait instability 10/23/2015  . Edema 10/23/2015  . Encounter for therapeutic drug monitoring  10/30/2014  . Carotid stenosis 09/06/2012  . Metabolic syndrome 74/04/8785  . Lupus anticoagulant with hypercoagulable state ([place]) 06/23/2011  . CVA (cerebrovascular accident) ([place]) 03/06/2011  . Hematuria 02/11/2010  . 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 Hyperlipidemia 12/04/2006  . Depression 12/04/2006  . Essential hypertension 12/04/2006    Current Outpatient Prescriptions on File Prior to Visit  Medication Sig Dispense Refill  . ALPRAZolam (XANAX) 0.5 MG tablet Take 0.5 tab in morning and one tab in evening 135 tablet 0  . aspirin 81 MG tablet Take 81 mg by mouth daily.      . bifidobacterium infantis (ALIGN) capsule Take 1 capsule by mouth daily. 7 capsule 0  . DULoxetine (CYMBALTA) 30 MG capsule Take 1 capsule (30 mg total) by mouth daily. 90 capsule 1  . furosemide (LASIX) 40 MG tablet Take 1 tablet (40 mg total) by mouth daily. 90 tablet 1  . lisinopril (PRINIVIL,ZESTRIL) 5 MG tablet TAKE 1 TABLET (5 MG TOTAL) BY MOUTH DAILY. 90 tablet 3  . potassium chloride SA (KLOR-CON M20) 20 MEQ tablet Take 1 tablet (20 mEq total) by mouth 2 (two) times daily. 180 tablet 3  . simvastatin (ZOCOR) 20 MG tablet Take 1 tablet (20 mg total) by mouth at bedtime. 90 tablet 3  . warfarin (COUMADIN) 5 MG tablet Take 1  tablet (5 mg total) by mouth daily. 90 tablet 1   No current facility-administered medications on file prior to visit.     Past Medical History:  Diagnosis Date  . Anxiety   . 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 Depression   . Elevated LFT's   . HA (headache)   . Hyperlipemia   . Hypertension   . Intracranial hemorrhage, spontaneous intraparenchymal, associated with coagulopathy, remote, resolved 06/23/2011  . Lupus anticoagulant disorder ([place])   . Lupus anticoagulant with hypercoagulable state ([place]) 06/23/2011  . Lupus erythematosus 06/23/2011  . Stroke [facility])     Past Surgical History:  Procedure Laterality Date  .  ABDOMINAL HYSTERECTOMY    . TRANSPHENOIDAL PITUITARY RESECTION      Social History   Social History  . Marital status: Married    Spouse name: N/A  . Number of children: 2  . Years of education: N/A   Occupational History  .  Unemployed   Social History Main Topics  . Smoking status: Never Smoker  . Smokeless tobacco: Never Used  . Alcohol use No  . Drug use: No  . Sexual activity: Not on file   Other Topics Concern  . Not on file   Social History Narrative   2 cups of coffee daily   Retired       Family History  Problem Relation Age of Onset  . Diabetes Brother   . Diabetes Sister   . Hypertension Mother   . Dementia Mother   . Bladder Cancer Brother   . Colon cancer Neg Hx     Review of Systems  Constitutional: Negative for fever.  Eyes: Negative for visual disturbance.  Musculoskeletal: Positive for arthralgias, back pain, myalgias, neck pain and neck stiffness.  Neurological: Positive for weakness and headaches. Negative for dizziness, light-headedness and numbness.       Objective:   Vitals:   10/27/16 1338  BP: 124/80  Pulse: 84  Resp: 16  Temp: 98.5 F (36.9 C)   Filed Weights   10/27/16 1338  Weight: 191 lb (86.6 kg)   Body mass index is 32.79 kg/m.  Wt Readings from Last 3 Encounters:  10/27/16 191 lb (86.6 kg)  08/19/16 198 lb (89.8 kg)  08/06/16 197 lb (89.4 kg)     Physical Exam  Constitutional: She appears well-developed and well-nourished. No distress.  HENT:  Head: Normocephalic and atraumatic.  Left TMJ tender, left lower jaw and mandible tender, mild swelling in left cheek, increased pain with talking/opening mouth  Neck: Neck supple. No tracheal deviation present. No thyromegaly present.  Musculoskeletal: She exhibits tenderness. She exhibits no edema.  Tenderness posterior neck - spine and muscles, muscles in upper back and anterior neck, and left facial muscles  Lymphadenopathy:    She has no cervical adenopathy.    Neurological: No cranial nerve deficit. She exhibits abnormal muscle tone (muscle tightness in neck, upper back and possibly left side of face - all areas tender to palpation).  Skin: Skin is warm and dry. No rash noted. She is not diaphoretic.          Assessment & Plan:   See Problem List for Assessment and Plan of chronic medical problems.

## 2016-10-27 ENCOUNTER — Encounter: Payer: Self-pay | Admitting: Internal Medicine

## 2016-10-27 ENCOUNTER — Ambulatory Visit (INDEPENDENT_AMBULATORY_CARE_PROVIDER_SITE_OTHER): Payer: Medicare Other | Admitting: Internal Medicine

## 2016-10-27 ENCOUNTER — Other Ambulatory Visit (INDEPENDENT_AMBULATORY_CARE_PROVIDER_SITE_OTHER): Payer: Medicare Other

## 2016-10-27 VITALS — BP 124/80 | HR 84 | Temp 98.5°F | Resp 16 | Wt 191.0 lb

## 2016-10-27 DIAGNOSIS — F32A Depression, unspecified: Secondary | ICD-10-CM

## 2016-10-27 DIAGNOSIS — F419 Anxiety disorder, unspecified: Secondary | ICD-10-CM

## 2016-10-27 DIAGNOSIS — G8929 Other chronic pain: Secondary | ICD-10-CM

## 2016-10-27 DIAGNOSIS — F329 Major depressive disorder, single episode, unspecified: Secondary | ICD-10-CM

## 2016-10-27 DIAGNOSIS — R6884 Jaw pain: Secondary | ICD-10-CM | POA: Diagnosis not present

## 2016-10-27 DIAGNOSIS — I10 Essential (primary) hypertension: Secondary | ICD-10-CM

## 2016-10-27 DIAGNOSIS — E7800 Pure hypercholesterolemia, unspecified: Secondary | ICD-10-CM | POA: Diagnosis not present

## 2016-10-27 DIAGNOSIS — M5412 Radiculopathy, cervical region: Secondary | ICD-10-CM

## 2016-10-27 DIAGNOSIS — R7303 Prediabetes: Secondary | ICD-10-CM

## 2016-10-27 DIAGNOSIS — M5416 Radiculopathy, lumbar region: Secondary | ICD-10-CM | POA: Diagnosis not present

## 2016-10-27 LAB — COMPREHENSIVE METABOLIC PANEL
ALK PHOS: 82 U/L (ref 39–117)
ALT: 21 U/L (ref 0–35)
AST: 16 U/L (ref 0–37)
Albumin: 4.4 g/dL (ref 3.5–5.2)
BUN: 19 mg/dL (ref 6–23)
CO2: 30 mEq/L (ref 19–32)
Calcium: 9.6 mg/dL (ref 8.4–10.5)
Chloride: 101 mEq/L (ref 96–112)
Creatinine, Ser: 0.85 mg/dL (ref 0.40–1.20)
GFR: 70.17 mL/min (ref >60.00)
GLUCOSE: 113 mg/dL — AB (ref 70–99)
POTASSIUM: 3.4 meq/L — AB (ref 3.5–5.1)
SODIUM: 141 meq/L (ref 135–145)
TOTAL PROTEIN: 7.5 g/dL (ref 6.0–8.3)
Total Bilirubin: 0.7 mg/dL (ref 0.2–1.2)

## 2016-10-27 LAB — LIPID PANEL
CHOL/HDL RATIO: 3
Cholesterol: 203 mg/dL — ABNORMAL HIGH (ref 0–200)
HDL: 77.4 mg/dL (ref >39.00)
LDL Cholesterol: 104 mg/dL — ABNORMAL HIGH (ref 0–99)
NONHDL: 125.55
Triglycerides: 108 mg/dL (ref 0.0–149.0)
VLDL: 21.6 mg/dL (ref 0.0–40.0)

## 2016-10-27 LAB — TSH: TSH: 1.31 u[IU]/mL (ref 0.35–4.50)

## 2016-10-27 LAB — HEMOGLOBIN A1C: Hgb A1c MFr Bld: 5.6 % (ref 4.6–6.5)

## 2016-10-27 MED ORDER — ACETAMINOPHEN-CODEINE 300-30 MG PO TABS: 1.0000 | ORAL_TABLET | Freq: Three times a day (TID) | ORAL | 0 refills | Status: DC | PRN

## 2016-10-27 NOTE — Assessment & Plan Note (Signed)
Has had imaging and seen ortho Has significant muscle tightness - did not feel muscle relaxer's helped Will consider acupuncture, chiropractor Tylenol #3 as needed for pain

## 2016-10-27 NOTE — Assessment & Plan Note (Signed)
Not controlled Has not tolerated a higher dose of cymbalta Seeing a therapist If we can get her pain better controlled her depression will likely improve May need pscyh consult

## 2016-10-27 NOTE — Assessment & Plan Note (Signed)
Lower back pain with left leg pain/ weakness Tylenol #3 as needed May need to see pain management Has seen ortho and had imaging Will try chiropractor / accupunture

## 2016-10-27 NOTE — Assessment & Plan Note (Signed)
Seeing a therapist Taking cymbalta - anxiety not controlled Taking xanax twice daily Consider changing xanax to clonazepam

## 2016-10-27 NOTE — Patient Instructions (Signed)
  A CT scan of your face/head was ordered. Someone will call you to schedule this.   Medications reviewed and updated.  Changes include starting Tylenol with Codeine for pain.     Please followup as scheduled next month.
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
## 2016-10-29 ENCOUNTER — Encounter: Payer: Self-pay | Admitting: Family Medicine

## 2016-10-30 ENCOUNTER — Encounter: Payer: Self-pay | Admitting: Neurology

## 2016-10-31 ENCOUNTER — Ambulatory Visit (INDEPENDENT_AMBULATORY_CARE_PROVIDER_SITE_OTHER)
Admission: RE | Admit: 2016-10-31 | Discharge: 2016-10-31 | Disposition: A | Discharge status: Home / Self Care | Payer: Medicare Other | Source: Ambulatory Visit | Attending: Internal Medicine | Admitting: Internal Medicine

## 2016-10-31 DIAGNOSIS — G8929 Other chronic pain: Secondary | ICD-10-CM

## 2016-10-31 DIAGNOSIS — R6884 Jaw pain: Secondary | ICD-10-CM | POA: Diagnosis not present

## 2016-11-01 ENCOUNTER — Encounter: Payer: Self-pay | Admitting: Internal Medicine

## 2016-11-03 ENCOUNTER — Telehealth: Payer: Self-pay | Admitting: *Deleted

## 2016-11-03 ENCOUNTER — Ambulatory Visit (INDEPENDENT_AMBULATORY_CARE_PROVIDER_SITE_OTHER): Payer: Medicare Other | Admitting: Neurology

## 2016-11-03 ENCOUNTER — Encounter: Payer: Self-pay | Admitting: Family Medicine

## 2016-11-03 ENCOUNTER — Encounter: Payer: Self-pay | Admitting: Neurology

## 2016-11-03 VITALS — BP 128/78 | HR 81 | Ht 64.0 in | Wt 194.0 lb

## 2016-11-03 DIAGNOSIS — M26649 Arthritis of unspecified temporomandibular joint: Secondary | ICD-10-CM

## 2016-11-03 DIAGNOSIS — M5412 Radiculopathy, cervical region: Secondary | ICD-10-CM

## 2016-11-03 DIAGNOSIS — M5442 Lumbago with sciatica, left side: Secondary | ICD-10-CM | POA: Diagnosis not present

## 2016-11-03 DIAGNOSIS — G8929 Other chronic pain: Secondary | ICD-10-CM | POA: Diagnosis not present

## 2016-11-03 DIAGNOSIS — M2669 Other specified disorders of temporomandibular joint: Secondary | ICD-10-CM | POA: Diagnosis not present

## 2016-11-03 DIAGNOSIS — F329 Major depressive disorder, single episode, unspecified: Secondary | ICD-10-CM

## 2016-11-03 DIAGNOSIS — R200 Anesthesia of skin: Secondary | ICD-10-CM

## 2016-11-03 DIAGNOSIS — F419 Anxiety disorder, unspecified: Secondary | ICD-10-CM

## 2016-11-03 DIAGNOSIS — F32A Depression, unspecified: Secondary | ICD-10-CM
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
## 2016-11-03 NOTE — Patient Instructions (Addendum)
1.  We will get MRI of the face with concentration of the left trigeminal nerve with and without contrast.  Further recommendations pending results. 2.  We will refer you to a periodontist to evaluate the left TMJ joint.

## 2016-11-03 NOTE — Telephone Encounter (Signed)
[facility] Dr Hali Marry [phone]   Called their clinic to refer patient for TMJ arthritis , they will call the patient and evaluate her over the phone and then schedule her for an appointment

## 2016-11-06 ENCOUNTER — Encounter: Payer: Self-pay | Admitting: Internal Medicine

## 2016-11-07 ENCOUNTER — Telehealth: Payer: Self-pay | Admitting: Neurology

## 2016-11-07 NOTE — Telephone Encounter (Signed)
Patient wants to check on the status of the referral we were to do to a periodontist   please call her

## 2016-11-07 NOTE — Telephone Encounter (Signed)
Spoke to patient and gave her the information from the referral placed on 11/03/16 and informed her that the office is closed today but if she likes she can call on Monday and check the status. She verbalized understanding.

## 2016-11-10 ENCOUNTER — Telehealth: Payer: Self-pay | Admitting: Internal Medicine

## 2016-11-10 MED ORDER — ACETAMINOPHEN-CODEINE 300-30 MG PO TABS
1.0000 | ORAL_TABLET | Freq: Three times a day (TID) | ORAL | 0 refills | Status: DC | PRN
Start: 2016-11-10 — End: 2017-02-18

## 2016-11-10 NOTE — Telephone Encounter (Signed)
RX has been called into pharmacy.  

## 2016-11-10 NOTE — Telephone Encounter (Signed)
Please resend Acetaminophen-Codeine (TYLENOL/CODEINE #3) 300-30 MG  To same pharmacy they did not receive it

## 2016-11-11 ENCOUNTER — Other Ambulatory Visit: Payer: Self-pay | Admitting: Internal Medicine

## 2016-11-11 MED ORDER — FAMOTIDINE 20 MG PO TABS: 20.0000 mg | ORAL_TABLET | Freq: Two times a day (BID) | ORAL | 11 refills | Status: DC

## 2016-11-12 ENCOUNTER — Encounter: Payer: Self-pay | Admitting: Neurology

## 2016-11-13 ENCOUNTER — Encounter: Payer: Self-pay | Admitting: Neurology

## 2016-11-19 ENCOUNTER — Ambulatory Visit: Payer: Self-pay

## 2016-11-20 ENCOUNTER — Ambulatory Visit
Admission: RE | Admit: 2016-11-20 | Discharge: 2016-11-20 | Disposition: A | Discharge status: Home / Self Care | Payer: Medicare Other | Source: Ambulatory Visit | Attending: Neurology | Admitting: Neurology

## 2016-11-20 DIAGNOSIS — M5412 Radiculopathy, cervical region: Secondary | ICD-10-CM

## 2016-11-20 DIAGNOSIS — G8929 Other chronic pain: Secondary | ICD-10-CM

## 2016-11-20 DIAGNOSIS — F32A Depression, unspecified: Secondary | ICD-10-CM

## 2016-11-20 DIAGNOSIS — F329 Major depressive disorder, single episode, unspecified: Secondary | ICD-10-CM

## 2016-11-20 DIAGNOSIS — R200 Anesthesia of skin: Secondary | ICD-10-CM

## 2016-11-20 DIAGNOSIS — N200 Calculus of kidney: Secondary | ICD-10-CM | POA: Diagnosis not present

## 2016-11-20 DIAGNOSIS — M2669 Other specified disorders of temporomandibular joint: Secondary | ICD-10-CM

## 2016-11-20 DIAGNOSIS — M26649 Arthritis of unspecified temporomandibular joint: Secondary | ICD-10-CM

## 2016-11-20 DIAGNOSIS — F419 Anxiety disorder, unspecified: Secondary | ICD-10-CM

## 2016-11-20 DIAGNOSIS — M5442 Lumbago with sciatica, left side: Secondary | ICD-10-CM

## 2016-11-20 MED ORDER — GADOBENATE DIMEGLUMINE 529 MG/ML IV SOLN
18.0000 mL | Freq: Once | INTRAVENOUS | Status: AC | PRN
  Administered 2016-11-20: 18 mL via INTRAVENOUS

## 2016-11-21 ENCOUNTER — Telehealth: Payer: Self-pay | Admitting: Neurology

## 2016-11-21 ENCOUNTER — Encounter: Payer: Self-pay | Admitting: Neurology

## 2016-11-21 NOTE — Telephone Encounter (Signed)
Left message on machine for patient to call back.

## 2016-11-21 NOTE — Telephone Encounter (Signed)
Patient called back and explained results.   She has appt with Dr. Lucia Gaskins. Copy of MR sent to him.

## 2016-11-21 NOTE — Telephone Encounter (Signed)
Patient send a message via mychart.

## 2016-11-21 NOTE — Telephone Encounter (Signed)
-----   Message from [place], DO sent at 11/21/2016  9:00 AM EDT ----- Let pt know that facial nerve looks okay but has lots of mastoid fluid that has gotten worse since previous scan.  Does she have ENT doctor?  If not, please refer to ENT

## 2016-11-24 DIAGNOSIS — H6522 Chronic serous otitis media, left ear: Secondary | ICD-10-CM | POA: Diagnosis not present

## 2016-11-24 DIAGNOSIS — R6884 Jaw pain: Secondary | ICD-10-CM | POA: Diagnosis not present

## 2016-11-26 ENCOUNTER — Ambulatory Visit (INDEPENDENT_AMBULATORY_CARE_PROVIDER_SITE_OTHER): Payer: Medicare Other | Admitting: General Practice

## 2016-11-26 DIAGNOSIS — Z5181 Encounter for therapeutic drug level monitoring: Secondary | ICD-10-CM

## 2016-11-26 LAB — POCT INR: INR: 3.7

## 2016-11-26 NOTE — Patient Instructions (Signed)
Pre visit review using our clinic review tool, if applicable. No additional management support is needed unless otherwise documented below in the visit note. 

## 2016-11-26 NOTE — Progress Notes (Signed)
I agree with this plan.

## 2016-11-29 ENCOUNTER — Encounter: Payer: Self-pay | Admitting: Internal Medicine

## 2016-12-09 ENCOUNTER — Other Ambulatory Visit: Payer: Self-pay | Admitting: Internal Medicine

## 2016-12-09 DIAGNOSIS — H66003 Acute suppurative otitis media without spontaneous rupture of ear drum, bilateral: Secondary | ICD-10-CM | POA: Diagnosis not present

## 2016-12-10 ENCOUNTER — Ambulatory Visit (INDEPENDENT_AMBULATORY_CARE_PROVIDER_SITE_OTHER): Payer: Medicare Other | Admitting: General Practice

## 2016-12-10 DIAGNOSIS — Z5181 Encounter for therapeutic drug level monitoring: Secondary | ICD-10-CM | POA: Diagnosis not present

## 2016-12-10 LAB — POCT INR: INR: 2.3

## 2016-12-11 ENCOUNTER — Encounter: Payer: Self-pay | Admitting: Neurology

## 2016-12-11 NOTE — Patient Instructions (Signed)
Pre visit review using our clinic review tool, if applicable. No additional management support is needed unless otherwise documented below in the visit note. 

## 2016-12-11 NOTE — Progress Notes (Signed)
I agree with this plan.

## 2016-12-16 ENCOUNTER — Other Ambulatory Visit: Payer: Self-pay | Admitting: Otolaryngology

## 2016-12-16 DIAGNOSIS — G9600 Cerebrospinal fluid leak, unspecified: Secondary | ICD-10-CM

## 2016-12-18 ENCOUNTER — Ambulatory Visit
Admission: RE | Admit: 2016-12-18 | Discharge: 2016-12-18 | Disposition: A | Discharge status: Home / Self Care | Payer: Medicare Other | Source: Ambulatory Visit | Attending: Otolaryngology | Admitting: Otolaryngology

## 2016-12-18 DIAGNOSIS — G9600 Cerebrospinal fluid leak, unspecified: Secondary | ICD-10-CM

## 2016-12-18 DIAGNOSIS — H65192 Other acute nonsuppurative otitis media, left ear: Secondary | ICD-10-CM | POA: Diagnosis not present

## 2016-12-18 MED ORDER — IOPAMIDOL (ISOVUE-300) INJECTION 61%
70.0000 mL | Freq: Once | INTRAVENOUS | Status: AC | PRN
  Administered 2016-12-18: 70 mL via INTRAVENOUS

## 2016-12-24 ENCOUNTER — Ambulatory Visit: Payer: Self-pay

## 2016-12-25 ENCOUNTER — Other Ambulatory Visit: Payer: Self-pay | Admitting: Internal Medicine

## 2016-12-25 DIAGNOSIS — H66003 Acute suppurative otitis media without spontaneous rupture of ear drum, bilateral: Secondary | ICD-10-CM | POA: Diagnosis not present

## 2016-12-25 MED ORDER — FUROSEMIDE 40 MG PO TABS: 40.0000 mg | ORAL_TABLET | Freq: Every day | ORAL | 1 refills | Status: DC

## 2017-01-01 ENCOUNTER — Encounter: Payer: Self-pay | Admitting: Internal Medicine

## 2017-01-02 MED ORDER — FAMOTIDINE 20 MG PO TABS: 20.0000 mg | ORAL_TABLET | Freq: Two times a day (BID) | ORAL | 1 refills | Status: DC

## 2017-01-07 ENCOUNTER — Ambulatory Visit: Payer: Self-pay

## 2017-01-08 DIAGNOSIS — H6523 Chronic serous otitis media, bilateral: Secondary | ICD-10-CM | POA: Diagnosis not present

## 2017-01-14 ENCOUNTER — Ambulatory Visit (INDEPENDENT_AMBULATORY_CARE_PROVIDER_SITE_OTHER): Payer: Medicare Other | Admitting: General Practice

## 2017-01-14 DIAGNOSIS — Z5181 Encounter for therapeutic drug level monitoring: Secondary | ICD-10-CM | POA: Diagnosis not present

## 2017-01-14 DIAGNOSIS — Z7901 Long term (current) use of anticoagulants: Secondary | ICD-10-CM | POA: Diagnosis not present

## 2017-01-14 LAB — POCT INR: INR: 2.6

## 2017-01-14 NOTE — Patient Instructions (Signed)
Pre visit review using our clinic review tool, if applicable. No additional management support is needed unless otherwise documented below in the visit note. 

## 2017-01-15 NOTE — Progress Notes (Signed)
I agree with this plan.

## 2017-01-20 DIAGNOSIS — H66003 Acute suppurative otitis media without spontaneous rupture of ear drum, bilateral: Secondary | ICD-10-CM | POA: Diagnosis not present

## 2017-01-20 DIAGNOSIS — R6884 Jaw pain: Secondary | ICD-10-CM | POA: Diagnosis not present

## 2017-01-22 DIAGNOSIS — H66003 Acute suppurative otitis media without spontaneous rupture of ear drum, bilateral: Secondary | ICD-10-CM | POA: Diagnosis not present

## 2017-01-29 ENCOUNTER — Ambulatory Visit: Payer: Self-pay | Admitting: Nurse Practitioner

## 2017-01-29 ENCOUNTER — Ambulatory Visit: Payer: Self-pay | Admitting: Internal Medicine

## 2017-02-04 ENCOUNTER — Encounter: Payer: Self-pay | Admitting: Internal Medicine

## 2017-02-04 ENCOUNTER — Ambulatory Visit (INDEPENDENT_AMBULATORY_CARE_PROVIDER_SITE_OTHER): Payer: Medicare Other | Admitting: Internal Medicine

## 2017-02-04 VITALS — BP 126/70 | HR 60 | Temp 97.8°F | Resp 16 | Wt 195.0 lb

## 2017-02-04 DIAGNOSIS — M5412 Radiculopathy, cervical region: Secondary | ICD-10-CM | POA: Diagnosis not present

## 2017-02-04 DIAGNOSIS — Z23 Encounter for immunization: Secondary | ICD-10-CM | POA: Diagnosis not present

## 2017-02-04 DIAGNOSIS — M25532 Pain in left wrist: Secondary | ICD-10-CM | POA: Diagnosis not present

## 2017-02-04 MED ORDER — TIZANIDINE HCL 4 MG PO TABS: 4.0000 mg | ORAL_TABLET | Freq: Every day | ORAL | 5 refills | Status: DC
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
## 2017-02-04 NOTE — Assessment & Plan Note (Signed)
Mild in nature per EMG in May 2018 She feels her symptoms are getting worse and wonders if she needs another EMG We'll refer to orthopedics for a fresh look Start tizanidine 4 mg at night to see if this helps with some of the chronic muscular tightness in her upper back and neck-may also benefit her TMJ

## 2017-02-04 NOTE — Patient Instructions (Addendum)
    Flu immunization administered today.   Medications reviewed and updated.  Changes include taking tizanidine at night  Your prescription(s) have been submitted to your pharmacy. Please take as directed and contact our office if you believe you are having problem(s) with the medication(s).  A referral was ordered for orthopedics
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
## 2017-02-06 ENCOUNTER — Ambulatory Visit: Payer: Self-pay | Admitting: Internal Medicine

## 2017-02-10 ENCOUNTER — Encounter (INDEPENDENT_AMBULATORY_CARE_PROVIDER_SITE_OTHER): Payer: Self-pay | Admitting: Orthopaedic Surgery

## 2017-02-10 ENCOUNTER — Ambulatory Visit (INDEPENDENT_AMBULATORY_CARE_PROVIDER_SITE_OTHER): Payer: Medicare Other | Admitting: Orthopaedic Surgery

## 2017-02-10 ENCOUNTER — Ambulatory Visit (INDEPENDENT_AMBULATORY_CARE_PROVIDER_SITE_OTHER): Payer: Medicare Other

## 2017-02-10 DIAGNOSIS — M25532 Pain in left wrist: Secondary | ICD-10-CM

## 2017-02-10 DIAGNOSIS — M5412 Radiculopathy, cervical region: Secondary | ICD-10-CM

## 2017-02-10 NOTE — Addendum Note (Signed)
Addended by: Precious Bard on: 02/10/2017 03:18 PM   Modules accepted: Orders
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
## 2017-02-11 ENCOUNTER — Encounter (INDEPENDENT_AMBULATORY_CARE_PROVIDER_SITE_OTHER): Payer: Self-pay | Admitting: Orthopaedic Surgery

## 2017-02-11 ENCOUNTER — Ambulatory Visit: Payer: Medicare Other | Admitting: General Practice

## 2017-02-11 DIAGNOSIS — Z7901 Long term (current) use of anticoagulants: Secondary | ICD-10-CM

## 2017-02-11 LAB — POCT INR: INR: 3.3

## 2017-02-12 ENCOUNTER — Ambulatory Visit
Admission: RE | Admit: 2017-02-12 | Discharge: 2017-02-12 | Disposition: A | Discharge status: Home / Self Care | Payer: Medicare Other | Source: Ambulatory Visit | Attending: Orthopaedic Surgery | Admitting: Orthopaedic Surgery

## 2017-02-12 DIAGNOSIS — M25532 Pain in left wrist: Secondary | ICD-10-CM

## 2017-02-12 DIAGNOSIS — R600 Localized edema: Secondary | ICD-10-CM | POA: Diagnosis not present

## 2017-02-15 NOTE — Progress Notes (Deleted)
Subjective:    Patient ID: Amanda Turner, female    DOB: 14-May-1945, 71 y.o.   MRN: [id number]  HPI The patient is here for follow up.  Hypertension: She is taking her medication daily. She is compliant with a low sodium diet.  She denies chest pain, palpitations, edema, shortness of breath and regular headaches. She is exercising regularly.  She does not monitor her blood pressure at home.    Prediabetes:  She is compliant with a low sugar/carbohydrate diet.  She is exercising regularly.  Hyperlipidemia: She is taking her medication daily. She is compliant with a low fat/cholesterol diet. She is exercising regularly. She denies myalgias.   Depression: She is taking her medication daily as prescribed. She denies any side effects from the medication. She feels her depression is well controlled and she is happy with her current dose of medication.   Anxiety: She is taking her medication daily as prescribed. She denies any side effects from the medication. She feels her anxiety is well controlled and she is happy with her current dose of medication.     Medications and allergies reviewed with patient and updated if appropriate.  Patient Active Problem List   Diagnosis Date Noted  . Long term (current) use of anticoagulants 01/14/2017  . Chronic jaw pain 10/27/2016  . Prediabetes 08/06/2016  . Chest pain 05/07/2016  . Subluxation of extensor carpi ulnaris tendon, left, initial encounter 03/24/2016  . Left lumbar radiculopathy 02/12/2016  . Post-concussion headache 01/25/2016  . Achilles tendinosis 01/17/2016  . Pain in left wrist 01/17/2016  . Pain of left heel 12/26/2015  . Cervical radiculopathy at C5 12/13/2015  . Low back pain 12/13/2015  . Leg cramps 12/13/2015  . Anxiety 10/23/2015  . Gait instability 10/23/2015  . Edema 10/23/2015  . Encounter for therapeutic drug monitoring 10/30/2014  . Carotid stenosis 09/06/2012  . Metabolic syndrome 38/75/6433  . Lupus  anticoagulant with hypercoagulable state ([place]) 06/23/2011  . CVA (cerebrovascular accident) ([place]) 03/06/2011  . Hematuria 02/11/2010  . 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 Hyperlipidemia 12/04/2006  . Depression 12/04/2006  . Essential hypertension 12/04/2006    Current Outpatient Prescriptions on File Prior to Visit  Medication Sig Dispense Refill  . Acetaminophen-Codeine (TYLENOL/CODEINE #3) 300-30 MG tablet Take 1 tablet by mouth every 8 (eight) hours as needed for pain. 30 tablet 0  . ALPRAZolam (XANAX) 0.5 MG tablet Take 0.5 tab in morning and one tab in evening 135 tablet 0  . aspirin 81 MG tablet Take 81 mg by mouth daily.      . DULoxetine (CYMBALTA) 30 MG capsule Take 1 capsule (30 mg total) by mouth daily. 90 capsule 1  . famotidine (PEPCID) 20 MG tablet Take 1 tablet (20 mg total) by mouth 2 (two) times daily. 180 tablet 1  . furosemide (LASIX) 40 MG tablet Take 1 tablet (40 mg total) by mouth daily. 90 tablet 1  . lisinopril (PRINIVIL,ZESTRIL) 5 MG tablet TAKE 1 TABLET (5 MG TOTAL) BY MOUTH DAILY. 90 tablet 3  . potassium chloride SA (KLOR-CON M20) 20 MEQ tablet Take 1 tablet (20 mEq total) by mouth 2 (two) times daily. 180 tablet 3  . simvastatin (ZOCOR) 20 MG tablet Take 1 tablet (20 mg total) by mouth at bedtime. 90 tablet 3  . tiZANidine (ZANAFLEX) 4 MG tablet Take 1 tablet (4 mg total) by mouth at bedtime. 30 tablet 5  . warfarin (COUMADIN) 5 MG tablet Take 1 tablet (5 mg total) by  mouth daily. 90 tablet 1   No current facility-administered medications on file prior to visit.     Past Medical History:  Diagnosis Date  . Anxiety   . 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 Depression   . Elevated LFT's   . HA (headache)   . Hyperlipemia   . Hypertension   . Intracranial hemorrhage, spontaneous intraparenchymal, associated with coagulopathy, remote, resolved 06/23/2011  . Lupus anticoagulant disorder ([place])   . Lupus  anticoagulant with hypercoagulable state ([place]) 06/23/2011  . Lupus erythematosus 06/23/2011  . Stroke [facility])     Past Surgical History:  Procedure Laterality Date  . ABDOMINAL HYSTERECTOMY    . TRANSPHENOIDAL PITUITARY RESECTION      Social History   Social History  . Marital status: Married    Spouse name: N/A  . Number of children: 2  . Years of education: N/A   Occupational History  .  Unemployed   Social History Main Topics  . Smoking status: Never Smoker  . Smokeless tobacco: Never Used  . Alcohol use No  . Drug use: No  . Sexual activity: Not on file   Other Topics Concern  . Not on file   Social History Narrative   2 cups of coffee daily   Retired       Family History  Problem Relation Age of Onset  . Diabetes Brother   . Diabetes Sister   . Hypertension Mother   . Dementia Mother   . Bladder Cancer Brother   . Colon cancer Neg Hx     Review of Systems     Objective:  There were no vitals filed for this visit. Wt Readings from Last 3 Encounters:  02/04/17 195 lb (88.5 kg)  11/03/16 194 lb (88 kg)  10/27/16 191 lb (86.6 kg)   There is no height or weight on file to calculate BMI.   Physical Exam    Constitutional: Appears well-developed and well-nourished. No distress.  HENT:  Head: Normocephalic and atraumatic.  Neck: Neck supple. No tracheal deviation present. No thyromegaly present.  No cervical lymphadenopathy Cardiovascular: Normal rate, regular rhythm and normal heart sounds.   No murmur heard. No carotid bruit .  No edema Pulmonary/Chest: Effort normal and breath sounds normal. No respiratory distress. No has no wheezes. No rales.  Skin: Skin is warm and dry. Not diaphoretic.  Psychiatric: Normal mood and affect. Behavior is normal.      Assessment & Plan:    See Problem List for Assessment and Plan of chronic medical problems.

## 2017-02-18 ENCOUNTER — Ambulatory Visit (INDEPENDENT_AMBULATORY_CARE_PROVIDER_SITE_OTHER): Payer: Medicare Other | Admitting: Internal Medicine

## 2017-02-18 ENCOUNTER — Encounter (INDEPENDENT_AMBULATORY_CARE_PROVIDER_SITE_OTHER): Payer: Self-pay | Admitting: Orthopaedic Surgery

## 2017-02-18 ENCOUNTER — Encounter: Payer: Self-pay | Admitting: Internal Medicine

## 2017-02-18 ENCOUNTER — Ambulatory Visit: Payer: Medicare Other | Admitting: Internal Medicine

## 2017-02-18 VITALS — BP 128/74 | HR 68 | Temp 97.8°F | Resp 16 | Wt 194.0 lb

## 2017-02-18 DIAGNOSIS — R600 Localized edema: Secondary | ICD-10-CM | POA: Diagnosis not present

## 2017-02-18 DIAGNOSIS — F329 Major depressive disorder, single episode, unspecified: Secondary | ICD-10-CM

## 2017-02-18 DIAGNOSIS — E7800 Pure hypercholesterolemia, unspecified: Secondary | ICD-10-CM | POA: Diagnosis not present

## 2017-02-18 DIAGNOSIS — I10 Essential (primary) hypertension: Secondary | ICD-10-CM | POA: Diagnosis not present

## 2017-02-18 DIAGNOSIS — R7303 Prediabetes: Secondary | ICD-10-CM

## 2017-02-18 DIAGNOSIS — F32A Depression, unspecified: Secondary | ICD-10-CM

## 2017-02-18 DIAGNOSIS — M5412 Radiculopathy, cervical region: Secondary | ICD-10-CM

## 2017-02-18 DIAGNOSIS — F419 Anxiety disorder, unspecified: Secondary | ICD-10-CM | POA: Diagnosis not present

## 2017-02-18 MED ORDER — ACETAMINOPHEN-CODEINE 300-30 MG PO TABS: 1.0000 | ORAL_TABLET | Freq: Three times a day (TID) | ORAL | 0 refills | Status: DC | PRN

## 2017-02-18 NOTE — Assessment & Plan Note (Signed)
Sugars have been well controlled in the past Continue low sugar/carbohydrate diet Encouraged regular exercise Encouraged weight loss Follow-up in 6 months-we will recheck A1c at that time
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
## 2017-02-18 NOTE — Assessment & Plan Note (Signed)
She does experience chronic neck pain She will be seen orthopedics and will have injections to Tizanidine 4 mg at night is helping, will continue Continue Tylenol 3 as needed only-takes about twice a week. She uses this only when she has severe pain and it does help relieve her pain. She understands that she should only take this when the pain is more severe. Follow-up in 6 months

## 2017-02-18 NOTE — Assessment & Plan Note (Signed)
Mild Controlled Continue furosemide

## 2017-02-18 NOTE — Patient Instructions (Signed)
  Medications reviewed and updated.  No changes recommended at this time.  Your prescription(s) have been submitted to your pharmacy. Please take as directed and contact our office if you believe you are having problem(s) with the medication(s).    Please followup in 6 months   
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
## 2017-02-18 NOTE — Assessment & Plan Note (Signed)
Continue statin Will recheck lipid panel 6 months

## 2017-02-18 NOTE — Assessment & Plan Note (Signed)
BP well controlled Current regimen effective and well tolerated Continue current medications at current doses  

## 2017-02-18 NOTE — Assessment & Plan Note (Signed)
Fairly controlled with current medication-continue Cymbalta 30 mg daily Encouraged her to go back to see the therapist Encouraged regular exercise

## 2017-02-19 ENCOUNTER — Other Ambulatory Visit: Payer: Self-pay

## 2017-02-19 DIAGNOSIS — H66003 Acute suppurative otitis media without spontaneous rupture of ear drum, bilateral: Secondary | ICD-10-CM | POA: Diagnosis not present

## 2017-02-23 ENCOUNTER — Ambulatory Visit (INDEPENDENT_AMBULATORY_CARE_PROVIDER_SITE_OTHER): Payer: Medicare Other | Admitting: Orthopaedic Surgery

## 2017-02-23 ENCOUNTER — Encounter (INDEPENDENT_AMBULATORY_CARE_PROVIDER_SITE_OTHER): Payer: Self-pay | Admitting: Orthopaedic Surgery

## 2017-02-23 DIAGNOSIS — M25832 Other specified joint disorders, left wrist: Secondary | ICD-10-CM | POA: Diagnosis not present

## 2017-02-23 DIAGNOSIS — H66003 Acute suppurative otitis media without spontaneous rupture of ear drum, bilateral: Secondary | ICD-10-CM | POA: Diagnosis not present

## 2017-02-23 MED ORDER — METHYLPREDNISOLONE ACETATE 40 MG/ML IJ SUSP
40.0000 mg | INTRAMUSCULAR | Status: AC | PRN
Start: 2017-02-23 — End: 2017-02-23
  Administered 2017-02-23: 40 mg via INTRA_ARTICULAR

## 2017-02-23 MED ORDER — LIDOCAINE HCL 1 % IJ SOLN
1.0000 mL | INTRAMUSCULAR | Status: AC | PRN
  Administered 2017-02-23: 1 mL

## 2017-02-23 MED ORDER — BUPIVACAINE HCL 0.5 % IJ SOLN
1.0000 mL | INTRAMUSCULAR | Status: AC | PRN
  Administered 2017-02-23: 1 mL via INTRA_ARTICULAR

## 2017-02-23 NOTE — Progress Notes (Signed)
Office Visit Note   Patient: Amanda Turner           Date of Birth: [birth year]           MRN: [id number] Visit Date: 02/23/2017              Requested by: [name], MD [place], [place] [id number] PCP: [name], MD   Assessment & Plan: Visit Diagnoses:  1. 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  Positive ulnar variance.  Left wrist injection was performed today which patient tolerated well.  I reviewed the MRI findings with the patient and if this injection was to give her significant wall discuss the option of wrist arthroscopy.  She is on Coumadin for prior strokes.  Follow-Up Instructions: Return if symptoms worsen or fail to improve.   Orders:  No orders of the defined types were placed in this encounter.  No orders of the defined types were placed in this encounter.     Procedures: Medium Joint Inj Date/Time: 02/23/2017 3:34 PM Performed by: Leandrew Koyanagi Authorized by: Leandrew Koyanagi   Indications:  Pain Location:  Wrist Site:  L radiocarpal Needle Size:  25 G Approach:  Dorsal Ultrasound Guided: No   Fluoroscopic Guidance: No   Medications:  1 mL lidocaine 1 %; 40 mg methylPREDNISolone acetate 40 MG/ML; 1 mL bupivacaine 0.5 %     Clinical Data: No additional findings.   Subjective: Chief Complaint  Patient presents with  . Left Wrist - Pain    Patient follows up today for left wrist pain MRI that she recently obtained.  Denies any changes in medical history.    Review of Systems  Constitutional: Negative.   HENT: Negative.   Eyes: Negative.   Respiratory: Negative.   Cardiovascular: Negative.   Endocrine: Negative.   Musculoskeletal: Negative.   Neurological: Negative.   Hematological: Negative.   Psychiatric/Behavioral: Negative.   All other systems reviewed and are  negative.    Objective: Vital Signs: There were no vitals taken for this visit.  Physical Exam  Constitutional: She is oriented to person, place, and time. She appears well-developed and well-nourished.  Pulmonary/Chest: Effort normal.  Neurological: She is alert and oriented to person, place, and time.  Skin: Skin is warm. Capillary refill takes less than 2 seconds.  Psychiatric: She has a normal mood and affect. Her behavior is normal. Judgment and thought content normal.  Nursing note and vitals reviewed.   Ortho Exam Left wrist exam shows tenderness in the interval. Specialty Comments:  No specialty comments available.  Imaging: No results found.   PMFS History: Patient Active Problem List   Diagnosis Date Noted  . 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 Long term (current) use of anticoagulants 01/14/2017  . Chronic jaw pain 10/27/2016  . Prediabetes 08/06/2016  . Chest pain 05/07/2016  . Subluxation of extensor carpi ulnaris tendon, left, initial encounter 03/24/2016  . Left lumbar radiculopathy 02/12/2016  . Post-concussion headache 01/25/2016  . Achilles tendinosis 01/17/2016  . Pain in left wrist 01/17/2016  . Pain of left heel 12/26/2015  . Cervical radiculopathy at C5 12/13/2015  . Low back pain 12/13/2015  . Leg cramps 12/13/2015  . Anxiety 10/23/2015  . Gait instability 10/23/2015  . Edema 10/23/2015  . Encounter for therapeutic drug  monitoring 10/30/2014  . Carotid stenosis 09/06/2012  . Metabolic syndrome 13/12/6576  . Lupus anticoagulant with hypercoagulable state ([place]) 06/23/2011  . CVA (cerebrovascular accident) ([place]) 03/06/2011  . Hematuria 02/11/2010  . 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 Hyperlipidemia 12/04/2006  . Depression 12/04/2006  . Essential hypertension 12/04/2006   Past Medical History:  Diagnosis Date  . Anxiety   . 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 Depression   .  Elevated LFT's   . HA (headache)   . Hyperlipemia   . Hypertension   . Intracranial hemorrhage, spontaneous intraparenchymal, associated with coagulopathy, remote, resolved 06/23/2011  . Lupus anticoagulant disorder ([place])   . Lupus anticoagulant with hypercoagulable state ([place]) 06/23/2011  . Lupus erythematosus 06/23/2011  . Stroke [facility])     Family History  Problem Relation Age of Onset  . Diabetes Brother   . Diabetes Sister   . Hypertension Mother   . Dementia Mother   . Bladder Cancer Brother   . Colon cancer Neg Hx     Past Surgical History:  Procedure Laterality Date  . ABDOMINAL HYSTERECTOMY    . TRANSPHENOIDAL PITUITARY RESECTION     Social History   Occupational History  .  Unemployed   Social History Main Topics  . Smoking status: Never Smoker  . Smokeless tobacco: Never Used  . Alcohol use No  . Drug use: No  . Sexual activity: Not on file

## 2017-03-10 ENCOUNTER — Encounter (INDEPENDENT_AMBULATORY_CARE_PROVIDER_SITE_OTHER): Payer: Self-pay | Admitting: Physical Medicine and Rehabilitation

## 2017-03-10 ENCOUNTER — Ambulatory Visit (INDEPENDENT_AMBULATORY_CARE_PROVIDER_SITE_OTHER): Payer: Medicare Other | Admitting: Physical Medicine and Rehabilitation

## 2017-03-10 DIAGNOSIS — G8929 Other chronic pain: Secondary | ICD-10-CM | POA: Diagnosis not present

## 2017-03-10 DIAGNOSIS — M542 Cervicalgia: Secondary | ICD-10-CM | POA: Diagnosis not present

## 2017-03-10 DIAGNOSIS — M609 Myositis, unspecified: Secondary | ICD-10-CM | POA: Diagnosis not present

## 2017-03-10 DIAGNOSIS — R202 Paresthesia of skin: Secondary | ICD-10-CM | POA: Diagnosis not present

## 2017-03-10 DIAGNOSIS — M25512 Pain in left shoulder: Secondary | ICD-10-CM | POA: Diagnosis not present

## 2017-03-10 NOTE — Progress Notes (Deleted)
Patient is complaining of neck pain that radiates into her arms, Left >Right, she has had an MRI and EMG [place] 09/09/2016 Pain with arm movement left > right to area C6. TMJ - pain and numbness in the left.

## 2017-03-11 ENCOUNTER — Ambulatory Visit (INDEPENDENT_AMBULATORY_CARE_PROVIDER_SITE_OTHER): Payer: Medicare Other | Admitting: General Practice

## 2017-03-11 ENCOUNTER — Other Ambulatory Visit: Payer: Self-pay | Admitting: Internal Medicine

## 2017-03-11 ENCOUNTER — Encounter (INDEPENDENT_AMBULATORY_CARE_PROVIDER_SITE_OTHER): Payer: Self-pay | Admitting: Physical Medicine and Rehabilitation

## 2017-03-11 DIAGNOSIS — Z7901 Long term (current) use of anticoagulants: Secondary | ICD-10-CM

## 2017-03-11 LAB — POCT INR: INR: 2.7

## 2017-03-11 NOTE — Progress Notes (Signed)
I agree with this plan.

## 2017-03-11 NOTE — Patient Instructions (Signed)
Pre visit review using our clinic review tool, if applicable. No additional management support is needed unless otherwise documented below in the visit note. 

## 2017-03-12 ENCOUNTER — Encounter (INDEPENDENT_AMBULATORY_CARE_PROVIDER_SITE_OTHER): Payer: Self-pay | Admitting: Physical Medicine and Rehabilitation
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
## 2017-03-12 NOTE — Telephone Encounter (Signed)
That's fine.  We can refer her to PT

## 2017-03-13 ENCOUNTER — Telehealth (INDEPENDENT_AMBULATORY_CARE_PROVIDER_SITE_OTHER): Payer: Self-pay | Admitting: Orthopaedic Surgery

## 2017-03-13 DIAGNOSIS — M542 Cervicalgia: Secondary | ICD-10-CM

## 2017-03-13 NOTE — Telephone Encounter (Signed)
Referral from Erlinda Hong was supposed to be put in for physical therapy/dry needling therapy. Patient was just checking on referral. Call back number is [phone]

## 2017-03-16 ENCOUNTER — Encounter: Payer: Self-pay | Admitting: Neurology

## 2017-03-16 DIAGNOSIS — H903 Sensorineural hearing loss, bilateral: Secondary | ICD-10-CM | POA: Diagnosis not present

## 2017-03-16 NOTE — Telephone Encounter (Signed)
This was the patient I was talking about on Friday that I fwd to Dr Erlinda Hong from Dr Ernestina Patches and couldn't find the previous msg. Is this what Dr Ernestina Patches wanted? Dr Erlinda Hong approved on PT

## 2017-03-16 NOTE — Telephone Encounter (Signed)
Called PT. No answer.  LMOM- order was made and they will call her to make appt

## 2017-03-16 NOTE — Telephone Encounter (Signed)
Yes, this is what he had discussed.

## 2017-03-16 NOTE — Addendum Note (Signed)
Addended by: Precious Bard on: 03/16/2017 03:47 PM   Modules accepted: Orders

## 2017-03-18 ENCOUNTER — Telehealth: Payer: Self-pay

## 2017-03-18 NOTE — Telephone Encounter (Signed)
LM on VM for Pt to rtrn my call.

## 2017-03-19 ENCOUNTER — Encounter (INDEPENDENT_AMBULATORY_CARE_PROVIDER_SITE_OTHER): Payer: Self-pay | Admitting: Orthopaedic Surgery

## 2017-03-19 NOTE — Telephone Encounter (Signed)
yes

## 2017-03-21 ENCOUNTER — Encounter: Payer: Self-pay | Admitting: Internal Medicine

## 2017-03-23 ENCOUNTER — Telehealth: Payer: Self-pay | Admitting: Neurology

## 2017-03-23 NOTE — Telephone Encounter (Signed)
Called and spoke with Pt, she was returning my call from 03/18/17-Pt has appointment for dry needling and will call us back if needed

## 2017-03-23 NOTE — Telephone Encounter (Signed)
Patient returned your call.  Thanks!

## 2017-03-24 ENCOUNTER — Other Ambulatory Visit (INDEPENDENT_AMBULATORY_CARE_PROVIDER_SITE_OTHER): Payer: Self-pay

## 2017-03-24 DIAGNOSIS — G8929 Other chronic pain: Secondary | ICD-10-CM

## 2017-03-24 DIAGNOSIS — M545 Low back pain: Principal | ICD-10-CM

## 2017-03-28 MED ORDER — DULOXETINE HCL 30 MG PO CPEP: 30.0000 mg | ORAL_CAPSULE | Freq: Two times a day (BID) | ORAL | 1 refills | Status: DC

## 2017-03-28 NOTE — Addendum Note (Signed)
Addended by: [name] on: 03/28/2017 10:54 AM   Modules accepted: Orders

## 2017-03-30 ENCOUNTER — Encounter: Payer: Self-pay | Admitting: Physical Therapy

## 2017-03-30 ENCOUNTER — Ambulatory Visit: Payer: Medicare Other | Attending: Orthopaedic Surgery | Admitting: Physical Therapy

## 2017-03-30 DIAGNOSIS — M545 Low back pain: Secondary | ICD-10-CM | POA: Diagnosis not present

## 2017-03-30 DIAGNOSIS — M79602 Pain in left arm: Secondary | ICD-10-CM | POA: Insufficient documentation

## 2017-03-30 DIAGNOSIS — M542 Cervicalgia: Secondary | ICD-10-CM

## 2017-03-30 DIAGNOSIS — M79601 Pain in right arm: Secondary | ICD-10-CM | POA: Insufficient documentation

## 2017-03-30 DIAGNOSIS — M25511 Pain in right shoulder: Secondary | ICD-10-CM | POA: Insufficient documentation

## 2017-03-30 DIAGNOSIS — M79604 Pain in right leg: Secondary | ICD-10-CM | POA: Insufficient documentation

## 2017-03-30 DIAGNOSIS — M79605 Pain in left leg: Secondary | ICD-10-CM | POA: Insufficient documentation

## 2017-03-30 DIAGNOSIS — M6281 Muscle weakness (generalized): Secondary | ICD-10-CM | POA: Insufficient documentation

## 2017-03-30 DIAGNOSIS — G8929 Other chronic pain: Secondary | ICD-10-CM | POA: Diagnosis not present

## 2017-03-30 DIAGNOSIS — M25512 Pain in left shoulder: Secondary | ICD-10-CM | POA: Insufficient documentation
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
## 2017-04-02 ENCOUNTER — Encounter: Payer: Self-pay | Admitting: Internal Medicine

## 2017-04-03 ENCOUNTER — Other Ambulatory Visit: Payer: Self-pay | Admitting: Emergency Medicine

## 2017-04-03 MED ORDER — WARFARIN SODIUM 5 MG PO TABS: 5.0000 mg | ORAL_TABLET | Freq: Every day | ORAL | 1 refills | Status: DC

## 2017-04-03 NOTE — Telephone Encounter (Signed)
Amanda Turner is out of office until next week, okay to refill warfarin?

## 2017-04-03 NOTE — Telephone Encounter (Signed)
Ok to fill 

## 2017-04-09 ENCOUNTER — Ambulatory Visit: Payer: Medicare Other | Attending: Orthopaedic Surgery | Admitting: Physical Therapy

## 2017-04-09 ENCOUNTER — Encounter: Payer: Self-pay | Admitting: Physical Therapy

## 2017-04-09 DIAGNOSIS — M25511 Pain in right shoulder: Secondary | ICD-10-CM | POA: Insufficient documentation

## 2017-04-09 DIAGNOSIS — M79605 Pain in left leg: Secondary | ICD-10-CM

## 2017-04-09 DIAGNOSIS — M79602 Pain in left arm: Secondary | ICD-10-CM

## 2017-04-09 DIAGNOSIS — M5442 Lumbago with sciatica, left side: Secondary | ICD-10-CM | POA: Insufficient documentation

## 2017-04-09 DIAGNOSIS — M79604 Pain in right leg: Secondary | ICD-10-CM | POA: Insufficient documentation

## 2017-04-09 DIAGNOSIS — M79601 Pain in right arm: Secondary | ICD-10-CM | POA: Diagnosis not present

## 2017-04-09 DIAGNOSIS — M25512 Pain in left shoulder: Secondary | ICD-10-CM | POA: Insufficient documentation

## 2017-04-09 DIAGNOSIS — M542 Cervicalgia: Secondary | ICD-10-CM | POA: Diagnosis not present

## 2017-04-09 DIAGNOSIS — M545 Low back pain: Secondary | ICD-10-CM | POA: Insufficient documentation

## 2017-04-09 DIAGNOSIS — G8929 Other chronic pain: Secondary | ICD-10-CM | POA: Insufficient documentation

## 2017-04-09 DIAGNOSIS — M5441 Lumbago with sciatica, right side: Secondary | ICD-10-CM | POA: Diagnosis not present

## 2017-04-09 DIAGNOSIS — M6281 Muscle weakness (generalized): Secondary | ICD-10-CM | POA: Diagnosis not present
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
## 2017-04-09 NOTE — Patient Instructions (Addendum)
Trigger Point Dry Needling  . What is Trigger Point Dry Needling (DN)? o DN is a physical therapy technique used to treat muscle pain and dysfunction. Specifically, DN helps deactivate muscle trigger points (muscle knots).  o A thin filiform needle is used to penetrate the skin and stimulate the underlying trigger point. The goal is for a local twitch response (LTR) to occur and for the trigger point to relax. No medication of any kind is injected during the procedure.   . What Does Trigger Point Dry Needling Feel Like?  o The procedure feels different for each individual patient. Some patients report that they do not actually feel the needle enter the skin and overall the process is not painful. Very mild bleeding may occur. However, many patients feel a deep cramping in the muscle in which the needle was inserted. This is the local twitch response.   Marland Kitchen How Will I feel after the treatment? o Soreness is normal, and the onset of soreness may not occur for a few hours. Typically this soreness does not last longer than two days.  o Bruising is uncommon, however; ice can be used to decrease any possible bruising.  o In rare cases feeling tired or nauseous after the treatment is normal. In addition, your symptoms may get worse before they get better, this period will typically not last longer than 24 hours.   . What Can I do After My Treatment? o Increase your hydration by drinking more water for the next 24 hours. o You may place ice or heat on the areas treated that have become sore, however, do not use heat on inflamed or bruised areas. Heat often brings more relief post needling. o You can continue your regular activities, but vigorous activity is not recommended initially after the treatment for 24 hours. o DN is best combined with other physical therapy such as strengthening, stretching, and other therapies.    quadratus lumborum stretch in sidelying. Handout given  Voncille Lo,  PT Certified Exercise Expert for the Aging Adult  04/09/17 4:50 PM Phone: [phone] Fax: [phone]

## 2017-04-13 ENCOUNTER — Encounter: Payer: Self-pay | Admitting: Physical Therapy

## 2017-04-16 ENCOUNTER — Encounter: Payer: Self-pay | Admitting: Internal Medicine

## 2017-04-16 ENCOUNTER — Encounter: Payer: Self-pay | Admitting: Physical Therapy

## 2017-04-16 ENCOUNTER — Other Ambulatory Visit: Payer: Self-pay | Admitting: Internal Medicine

## 2017-04-16 MED ORDER — ALPRAZOLAM 0.5 MG PO TABS: ORAL_TABLET | ORAL | 0 refills | Status: DC

## 2017-04-16 NOTE — Telephone Encounter (Signed)
[place] controlled substance database checked.  Ok to fill medication.  

## 2017-04-17 ENCOUNTER — Encounter: Payer: Self-pay | Admitting: Internal Medicine

## 2017-04-20 ENCOUNTER — Encounter: Payer: Self-pay | Admitting: Physical Therapy

## 2017-04-22 DIAGNOSIS — L0202 Furuncle of face: Secondary | ICD-10-CM | POA: Diagnosis not present

## 2017-04-22 DIAGNOSIS — B9689 Other specified bacterial agents as the cause of diseases classified elsewhere: Secondary | ICD-10-CM | POA: Diagnosis not present

## 2017-04-22 DIAGNOSIS — L820 Inflamed seborrheic keratosis: Secondary | ICD-10-CM | POA: Diagnosis not present

## 2017-04-23 ENCOUNTER — Ambulatory Visit: Payer: Medicare Other | Admitting: Physical Therapy

## 2017-04-29 ENCOUNTER — Ambulatory Visit (INDEPENDENT_AMBULATORY_CARE_PROVIDER_SITE_OTHER): Payer: Medicare Other | Admitting: General Practice

## 2017-04-29 ENCOUNTER — Encounter: Payer: Self-pay | Admitting: Physical Therapy

## 2017-04-29 DIAGNOSIS — Z7901 Long term (current) use of anticoagulants: Secondary | ICD-10-CM

## 2017-04-29 LAB — POCT INR: INR: 3.1

## 2017-04-29 NOTE — Progress Notes (Signed)
I have reviewed and agree with this plan  

## 2017-04-29 NOTE — Patient Instructions (Addendum)
Pre visit review using our clinic review tool, if applicable. No additional management support is needed unless otherwise documented below in the visit note.  Skip coumadin today and then continue to take 5 mg daily.  Re-check in 4 weeks.

## 2017-05-07 ENCOUNTER — Ambulatory Visit: Payer: Medicare Other | Attending: Orthopaedic Surgery | Admitting: Physical Therapy

## 2017-05-07 ENCOUNTER — Encounter: Payer: Self-pay | Admitting: Physical Therapy

## 2017-05-07 DIAGNOSIS — M25511 Pain in right shoulder: Secondary | ICD-10-CM | POA: Insufficient documentation

## 2017-05-07 DIAGNOSIS — M542 Cervicalgia: Secondary | ICD-10-CM | POA: Insufficient documentation

## 2017-05-07 DIAGNOSIS — G8929 Other chronic pain: Secondary | ICD-10-CM | POA: Insufficient documentation

## 2017-05-07 DIAGNOSIS — M25512 Pain in left shoulder: Secondary | ICD-10-CM | POA: Diagnosis not present

## 2017-05-07 DIAGNOSIS — M5442 Lumbago with sciatica, left side: Secondary | ICD-10-CM | POA: Insufficient documentation

## 2017-05-07 DIAGNOSIS — M79602 Pain in left arm: Secondary | ICD-10-CM | POA: Diagnosis not present

## 2017-05-07 DIAGNOSIS — M79604 Pain in right leg: Secondary | ICD-10-CM | POA: Diagnosis not present

## 2017-05-07 DIAGNOSIS — M5441 Lumbago with sciatica, right side: Secondary | ICD-10-CM | POA: Diagnosis not present

## 2017-05-07 DIAGNOSIS — M79601 Pain in right arm: Secondary | ICD-10-CM | POA: Diagnosis not present

## 2017-05-07 DIAGNOSIS — M79605 Pain in left leg: Secondary | ICD-10-CM | POA: Diagnosis not present

## 2017-05-07 DIAGNOSIS — M545 Low back pain: Secondary | ICD-10-CM | POA: Diagnosis not present

## 2017-05-07 DIAGNOSIS — M6281 Muscle weakness (generalized): Secondary | ICD-10-CM

## 2017-05-07 NOTE — Therapy (Signed)
[place], [place], [address] Phone: [phone]   Fax:  [phone]  Physical Therapy Treatment  Patient Details  Name: Amanda Turner MRN: [id number] Date of Birth: 11-26-1945 Referring Provider: Leandrew Koyanagi, MD   Encounter Date: 05/07/2017  PT End of Session - 05/07/17 1535    Visit Number  3    Number of Visits  17    Date for PT Re-Evaluation  05/29/17    Authorization Type  [employer] MCR KX at visit 15    PT Start Time  1540    PT Stop Time  1625    PT Time Calculation (min)  45 min    Activity Tolerance  Patient tolerated treatment well    Behavior During Therapy  [facility] for tasks assessed/performed       Past Medical History:  Diagnosis Date  . Anxiety   . 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 Depression   . Elevated LFT's   . HA (headache)   . Hyperlipemia   . Hypertension   . Intracranial hemorrhage, spontaneous intraparenchymal, associated with coagulopathy, remote, resolved 06/23/2011  . Lupus anticoagulant disorder ([place])   . Lupus anticoagulant with hypercoagulable state ([place]) 06/23/2011  . Lupus erythematosus 06/23/2011  . Stroke [facility])     Past Surgical History:  Procedure Laterality Date  . ABDOMINAL HYSTERECTOMY    . TRANSPHENOIDAL PITUITARY RESECTION      There were no vitals filed for this visit.  Subjective Assessment - 05/07/17 1540    Subjective  Pt reported having a lot of pain since last visit. I get cramps all over my body now. Reports cramps that start on Rt side around waist and then move to Lt, cramps in legs that she has to strech out. I think I have fibromyalgia. My jaw hurts today-Lt side.     Patient Stated Goals  stairs, make jewelry, household chores    Currently in Pain?  Yes    Pain Score  8     Pain Location  -- body    Pain Orientation  -- all over    Pain Descriptors / Indicators  Aching;Cramping    Aggravating Factors    constant pain    Pain Relieving Factors  nothing                      [facility] Adult PT Treatment/Exercise - 05/07/17 0001      Therapeutic Activites    Therapeutic Activities  Other Therapeutic Activities    Other Therapeutic Activities  bed mobility      Manual Therapy   Manual Therapy  Joint mobilization    Manual therapy comments  skilled palpation and monitoring during TPDN    Joint Mobilization  Rt first rib    Soft tissue mobilization  bilat scalenes, suboccipital release, bilat upper trap       Trigger Point Dry Needling - 05/07/17 1622    Muscles Treated Upper Body  -- scalenes bilat    Upper Trapezius Response  Twitch reponse elicited;Palpable increased muscle length Rt           PT Education - 05/07/17 1556    Education provided  Yes    Education Details  diet and exercises, muscle cramping, DN, come to PT when in pain, fibromyalgia    Person(s) Educated  Patient    Methods  Explanation    Comprehension  Verbalized understanding  PT Short Term Goals - 03/30/17 1729      PT SHORT TERM GOAL #1   Title  Average pain to <=6/10     Baseline  8/10 at eval    Time  4    Period  Weeks    Status  New    Target Date  05/01/17        PT Long Term Goals - 03/30/17 1730      PT LONG TERM GOAL #1   Title  Pt will be able to walk for at least 25 min for improved community ambulation    Baseline  5 min reported at eval    Time  8    Period  Weeks    Status  New    Target Date  05/29/17      PT LONG TERM GOAL #2   Title  Pt will be able to climb her stairs at home with proper form and without rest break    Baseline  feels limited by leg strength at eval    Time  8    Period  Weeks    Status  New    Target Date  05/29/17      PT LONG TERM GOAL #3   Title  Gross UE & LE strength to 4+/5 for proper support to biomechanical chain    Baseline  flowsheet to be updated at second visit-limited by time    Time  8    Period  Weeks    Status   New    Target Date  05/29/17      PT LONG TERM GOAL #4   Title  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 Large amount of time today taken to educate on anatomy of condiiton and rationale for different treatments. Pt was agreeable and is going to do water walking for exercise and begin diet and exercise journal. Suggested possible visit with maxillofacial surgery for chronic jaw problems.     PT Treatment/Interventions  Cryotherapy;Electrical Stimulation;Ultrasound;Traction;Moist Heat;ADLs/Self Care Home Management;Iontophoresis 4mg /ml Dexamethasone;Gait training;Stair training;Functional mobility training;Therapeutic activities;Therapeutic exercise;Balance training;Patient/family education;Neuromuscular re-education;Manual techniques;Passive range of motion;Taping;Dry needling    PT Next Visit Plan  DN as appropriate, nu step UE & LE, encourage gentle-full body exercise    PT Home Exercise Plan  scapular retraction, upper trap stretch    Consulted and Agree with Plan of Care  Patient       Patient will benefit from skilled therapeutic intervention in order to improve the following deficits and impairments:  Improper body mechanics, Pain, Postural dysfunction, Increased muscle spasms, Decreased activity tolerance, Decreased range of  motion, Decreased strength, Impaired UE functional use, Obesity, Impaired  flexibility, Difficulty walking, Decreased balance  Visit Diagnosis: Cervicalgia  Chronic bilateral low back pain, with sciatica presence unspecified  Chronic right shoulder pain  Chronic left shoulder pain  Pain in right arm  Pain in left arm  Pain in right leg  Pain in left leg  Muscle weakness (generalized)  Chronic bilateral low back pain with bilateral sciatica     Problem List Patient Active Problem List   Diagnosis Date Noted  . 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 Long term (current) use of anticoagulants 01/14/2017  . Chronic jaw pain 10/27/2016  . Prediabetes 08/06/2016  . Chest pain 05/07/2016  . Subluxation of extensor carpi ulnaris tendon, left, initial encounter 03/24/2016  . Left lumbar radiculopathy 02/12/2016  . Post-concussion headache 01/25/2016  . Achilles tendinosis 01/17/2016  . Pain in left wrist 01/17/2016  . Pain of left heel 12/26/2015  . Cervical radiculopathy at C5 12/13/2015  . Low back pain 12/13/2015  . Leg cramps 12/13/2015  . Anxiety 10/23/2015  . Gait instability 10/23/2015  . Edema 10/23/2015  . Encounter for therapeutic drug monitoring 10/30/2014  . Carotid stenosis 09/06/2012  . Metabolic syndrome 08/67/6195  . Lupus anticoagulant with hypercoagulable state ([place]) 06/23/2011  . CVA (cerebrovascular accident) ([place]) 03/06/2011  . Hematuria 02/11/2010  . 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 Hyperlipidemia 12/04/2006  . Depression 12/04/2006  . Essential hypertension 12/04/2006   Amanda Turner C. Lavada Langsam PT, DPT 05/07/17 4:29 PM   [facility] [address] [place], [place], [address] Phone: [phone]   Fax:  [phone]  Name: Amanda Turner MRN: [id number] Date of Birth: 08-24-1945

## 2017-05-08 ENCOUNTER — Encounter: Payer: Self-pay | Admitting: Family

## 2017-05-08 ENCOUNTER — Ambulatory Visit (INDEPENDENT_AMBULATORY_CARE_PROVIDER_SITE_OTHER): Payer: Medicare Other | Admitting: Family

## 2017-05-08 VITALS — BP 122/78 | HR 90 | Temp 99.1°F | Ht 64.0 in | Wt 191.1 lb

## 2017-05-08 DIAGNOSIS — R6889 Other general symptoms and signs: Secondary | ICD-10-CM

## 2017-05-08 MED ORDER — OSELTAMIVIR PHOSPHATE 75 MG PO CAPS: 75.0000 mg | ORAL_CAPSULE | Freq: Two times a day (BID) | ORAL | 0 refills | Status: DC

## 2017-05-08 MED ORDER — HYDROCODONE-HOMATROPINE 5-1.5 MG/5ML PO SYRP: 5.0000 mL | ORAL_SOLUTION | Freq: Three times a day (TID) | ORAL | 0 refills | Status: DC | PRN

## 2017-05-08 NOTE — Patient Instructions (Signed)
Increase fluids, rest;

## 2017-05-08 NOTE — Progress Notes (Signed)
Amanda Turner is a 72 y.o. female with the following history as recorded in EpicCare:  Patient Active Problem List   Diagnosis Date Noted  . 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 Long term (current) use of anticoagulants 01/14/2017  . Chronic jaw pain 10/27/2016  . Prediabetes 08/06/2016  . Chest pain 05/07/2016  . Subluxation of extensor carpi ulnaris tendon, left, initial encounter 03/24/2016  . Left lumbar radiculopathy 02/12/2016  . Post-concussion headache 01/25/2016  . Achilles tendinosis 01/17/2016  . Pain in left wrist 01/17/2016  . Pain of left heel 12/26/2015  . Cervical radiculopathy at C5 12/13/2015  . Low back pain 12/13/2015  . Leg cramps 12/13/2015  . Anxiety 10/23/2015  . Gait instability 10/23/2015  . Edema 10/23/2015  . Encounter for therapeutic drug monitoring 10/30/2014  . Carotid stenosis 09/06/2012  . Metabolic syndrome 70/35/0093  . Lupus anticoagulant with hypercoagulable state ([place]) 06/23/2011  . CVA (cerebrovascular accident) ([place]) 03/06/2011  . Hematuria 02/11/2010  . 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 Hyperlipidemia 12/04/2006  . Depression 12/04/2006  . Essential hypertension 12/04/2006    Current Outpatient Medications  Medication Sig Dispense Refill  . Acetaminophen-Codeine (TYLENOL/CODEINE #3) 300-30 MG tablet Take 1 tablet by mouth every 8 (eight) hours as needed for pain. 30 tablet 0  . ALPRAZolam (XANAX) 0.5 MG tablet Take 0.5 tab in morning and one tab in evening 135 tablet 0  . aspirin 81 MG tablet Take 81 mg by mouth daily.      . DULoxetine (CYMBALTA) 30 MG capsule Take 1 capsule (30 mg total) by mouth 2 (two) times daily. 180 capsule 1  . famotidine (PEPCID) 20 MG tablet Take 1 tablet (20 mg total) by mouth 2 (two) times daily. 180 tablet 1  . furosemide (LASIX) 40 MG tablet Take 1 tablet (40 mg total) by mouth daily. 90 tablet 1  . lisinopril (PRINIVIL,ZESTRIL) 5 MG tablet TAKE 1 TABLET (5 MG TOTAL) BY MOUTH DAILY. 90  tablet 3  . potassium chloride SA (KLOR-CON M20) 20 MEQ tablet Take 1 tablet (20 mEq total) by mouth 2 (two) times daily. 180 tablet 3  . simvastatin (ZOCOR) 20 MG tablet Take 1 tablet (20 mg total) by mouth at bedtime. 90 tablet 3  . tiZANidine (ZANAFLEX) 4 MG tablet Take 1 tablet (4 mg total) by mouth at bedtime. 30 tablet 5  . warfarin (COUMADIN) 5 MG tablet Take 1 tablet (5 mg total) by mouth daily. 90 tablet 1  . HYDROcodone-homatropine (HYCODAN) 5-1.5 MG/5ML syrup Take 5 mLs by mouth every 8 (eight) hours as needed for cough. 120 mL 0  . oseltamivir (TAMIFLU) 75 MG capsule Take 1 capsule (75 mg total) by mouth 2 (two) times daily. 10 capsule 0   No current facility-administered medications for this visit.     Allergies: Gabapentin; Cymbalta [duloxetine hcl]; Lisinopril; Losartan; Peanut oil; Shrimp flavor; and Sulfonamide derivatives  Past Medical History:  Diagnosis Date  . Anxiety   . 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 Depression   . Elevated LFT's   . HA (headache)   . Hyperlipemia   . Hypertension   . Intracranial hemorrhage, spontaneous intraparenchymal, associated with coagulopathy, remote, resolved 06/23/2011  . Lupus anticoagulant disorder ([place])   . Lupus anticoagulant with hypercoagulable state ([place]) 06/23/2011  . Lupus erythematosus 06/23/2011  . Stroke [facility])     Past Surgical History:  Procedure Laterality Date  . ABDOMINAL HYSTERECTOMY    . TRANSPHENOIDAL PITUITARY  RESECTION      Family History  Problem Relation Age of Onset  . Diabetes Brother   . Diabetes Sister   . Hypertension Mother   . Dementia Mother   . Bladder Cancer Brother   . Colon cancer Neg Hx     Social History   Tobacco Use  . Smoking status: Never Smoker  . Smokeless tobacco: Never Used  Substance Use Topics  . Alcohol use: No    Alcohol/week: 0.0 oz    Subjective:  Patient presents with flu-like symptoms; started suddenly yesterday with  cough, fever, body aches; "feel like I have been hit by a truck." On Tylenol for the fever; would like to get a prescription for Hycodan cough syrup- husband used it in the past and would like to try for herself; denies any chest pain, shortness of breath or wheezing;   Objective:  Vitals:   05/08/17 1516  BP: 122/78  Pulse: 90  Temp: 99.1 F (37.3 C)  TempSrc: Oral  SpO2: 98%  Weight: 191 lb 1.3 oz (86.7 kg)  Height: 5\' 4"  (1.626 m)    General: Well developed, well nourished, in no acute distress  Skin : Warm and dry.  Head: Normocephalic and atraumatic  Eyes: Sclera and conjunctiva clear; pupils round and reactive to light; extraocular movements intact  Ears: External normal; canals clear; tympanic membranes normal  Oropharynx: Pink, supple. No suspicious lesions  Neck: Supple without thyromegaly, adenopathy  Lungs: Respirations unlabored; clear to auscultation bilaterally without wheeze, rales, rhonchi  CVS exam: normal rate and regular rhythm.  Neurologic: Alert and oriented; speech intact; face symmetrical; moves all extremities well; CNII-XII intact without focal deficit  Assessment:  1. Flu-like symptoms     Plan:  Rapid flu is negative but clinical presentation is very concerning; will treat with Tamiflu 75 mg bid x 5 days; Rx for Hycodoan cough syrup- recommend to use at night; continue Tylenol; increase fluids, rest and follow-up worse, no better.   No Follow-up on file.  No orders of the defined types were placed in this encounter.   Requested Prescriptions   Signed Prescriptions Disp Refills  . oseltamivir (TAMIFLU) 75 MG capsule 10 capsule 0    Sig: Take 1 capsule (75 mg total) by mouth 2 (two) times daily.  Marland Kitchen HYDROcodone-homatropine (HYCODAN) 5-1.5 MG/5ML syrup 120 mL 0    Sig: Take 5 mLs by mouth every 8 (eight) hours as needed for cough.

## 2017-05-11 ENCOUNTER — Encounter: Payer: Self-pay | Admitting: Physical Therapy

## 2017-05-13 ENCOUNTER — Encounter: Payer: Self-pay | Admitting: Internal Medicine

## 2017-05-13 MED ORDER — DULOXETINE HCL 30 MG PO CPEP: 30.0000 mg | ORAL_CAPSULE | Freq: Two times a day (BID) | ORAL | 1 refills | Status: DC

## 2017-05-14 ENCOUNTER — Encounter: Payer: Self-pay | Admitting: Physical Therapy

## 2017-05-14 ENCOUNTER — Ambulatory Visit: Payer: Medicare Other | Admitting: Physical Therapy

## 2017-05-14 DIAGNOSIS — M25511 Pain in right shoulder: Secondary | ICD-10-CM | POA: Diagnosis not present

## 2017-05-14 DIAGNOSIS — M545 Low back pain: Secondary | ICD-10-CM

## 2017-05-14 DIAGNOSIS — M5441 Lumbago with sciatica, right side: Secondary | ICD-10-CM

## 2017-05-14 DIAGNOSIS — M6281 Muscle weakness (generalized): Secondary | ICD-10-CM

## 2017-05-14 DIAGNOSIS — M79602 Pain in left arm: Secondary | ICD-10-CM | POA: Diagnosis not present

## 2017-05-14 DIAGNOSIS — G8929 Other chronic pain: Secondary | ICD-10-CM | POA: Diagnosis not present

## 2017-05-14 DIAGNOSIS — M79605 Pain in left leg: Secondary | ICD-10-CM

## 2017-05-14 DIAGNOSIS — M542 Cervicalgia: Secondary | ICD-10-CM

## 2017-05-14 DIAGNOSIS — M5442 Lumbago with sciatica, left side: Secondary | ICD-10-CM | POA: Diagnosis not present

## 2017-05-14 DIAGNOSIS — M79604 Pain in right leg: Secondary | ICD-10-CM | POA: Diagnosis not present

## 2017-05-14 DIAGNOSIS — M25512 Pain in left shoulder: Secondary | ICD-10-CM

## 2017-05-14 DIAGNOSIS — M79601 Pain in right arm: Secondary | ICD-10-CM | POA: Diagnosis not present

## 2017-05-14 NOTE — Therapy (Addendum)
[facility] [address] [place], [place], [address] Phone: [phone]   Fax:  [phone]  Physical Therapy Treatment/Discharge Summary  Patient Details  Name: Amanda Turner MRN: [id number] Date of Birth: 12/11/1945 Referring Provider: Xu, Naiping M, MD   Encounter Date: 05/14/2017  PT End of Session - 05/14/17 1537    Visit Number  4    Number of Visits  17    Date for PT Re-Evaluation  05/29/17    Authorization Type  [employer] MCR KX at visit 15    PT Start Time  1537    PT Stop Time  1628    PT Time Calculation (min)  51 min    Activity Tolerance  Patient tolerated treatment well    Behavior During Therapy  WFL for tasks assessed/performed       Past Medical History:  Diagnosis Date  . Anxiety   . 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 Depression   . Elevated LFT's   . HA (headache)   . Hyperlipemia   . Hypertension   . Intracranial hemorrhage, spontaneous intraparenchymal, associated with coagulopathy, remote, resolved 06/23/2011  . Lupus anticoagulant disorder ([facility])   . Lupus anticoagulant with hypercoagulable state ([facility]) 06/23/2011  . Lupus erythematosus 06/23/2011  . Stroke ([facility])     Past Surgical History:  Procedure Laterality Date  . ABDOMINAL HYSTERECTOMY    . TRANSPHENOIDAL PITUITARY RESECTION      There were no vitals filed for this visit.  Subjective Assessment - 05/14/17 1541    Subjective  I am having a lot of pain in all my muscles, I don't know why. I tried calling maxillofacial surgeons but they said they do not treat jaws. Insurance issues with other providers.     Patient Stated Goals  stairs, make jewelry, household chores    Currently in Pain?  Yes    Pain Score  -- a lot    Pain Location  -- whole body    Aggravating Factors   constant pain    Pain Relieving Factors  nothing                      [facility] Adult PT Treatment/Exercise -  05/14/17 0001      Exercises   Exercises  Other Exercises    Other Exercises   Rocobado 6x6      Neck Exercises: Machines for Strengthening   Other Machines for Strengthening  nu step UE & LE L5 8 min      Moist Heat Therapy   Number Minutes Moist Heat  10 Minutes    Moist Heat Location  Shoulder      Manual Therapy   Manual therapy comments  skilled palpation and monitoring during TPDN    Soft tissue mobilization  bil upper trap       Trigger Point Dry Needling - 05/14/17 1623    Upper Trapezius Response  Twitch reponse elicited;Palpable increased muscle length bilateral             PT Short Term Goals - 03/30/17 1729      PT SHORT TERM GOAL #1   Title  Average pain to <=6/10     Baseline  8/10 at eval    Time  4    Period  Weeks    Status  New    Target Date  05/01/17        PT Long Term   Goals - 03/30/17 1730      PT LONG TERM GOAL #1   Title  Pt will be able to walk for at least 25 min for improved community ambulation    Baseline  5 min reported at eval    Time  8    Period  Weeks    Status  New    Target Date  05/29/17      PT LONG TERM GOAL #2   Title  Pt will be able to climb her stairs at home with proper form and without rest break    Baseline  feels limited by leg strength at eval    Time  8    Period  Weeks    Status  New    Target Date  05/29/17      PT LONG TERM GOAL #3   Title  Gross UE & LE strength to 4+/5 for proper support to biomechanical chain    Baseline  flowsheet to be updated at second visit-limited by time    Time  8    Period  Weeks    Status  New    Target Date  05/29/17      PT LONG TERM GOAL #4   Title  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 Twitch response in bilat upper trap & pt reported decreased pain.     PT Treatment/Interventions  Cryotherapy;Electrical Stimulation;Ultrasound;Traction;Moist Heat;ADLs/Self Care Home Management;Iontophoresis 4mg/ml Dexamethasone;Gait training;Stair training;Functional mobility training;Therapeutic activities;Therapeutic exercise;Balance training;Patient/family education;Neuromuscular re-education;Manual techniques;Passive range of motion;Taping;Dry needling    PT Next Visit Plan  DN as appropriate, nu step UE & LE, encourage gentle-full body exercise, cervical stabilization    PT Home Exercise Plan  scapular retraction, upper trap stretch, Rocobado 6x6    Recommended Other Services  dentist/orthodontist/maxillofacial    Consulted and Agree with Plan of Care  Patient       Patient will benefit from skilled therapeutic intervention in order to improve the following deficits and impairments:  Improper body mechanics, Pain, Postural dysfunction, Increased muscle spasms, Decreased activity tolerance, Decreased range of motion, Decreased strength, Impaired UE functional use, Obesity, Impaired flexibility, Difficulty walking, Decreased balance  Visit Diagnosis: Cervicalgia  Chronic bilateral low back pain, with sciatica presence unspecified  Chronic right shoulder pain  Chronic left shoulder pain  Pain in right arm  Pain in left arm  Pain in right leg  Pain in left leg  Chronic bilateral low back pain with bilateral sciatica  Muscle weakness (generalized)     Problem List Patient Active Problem List   Diagnosis Date Noted  . 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 Long term (current) use of anticoagulants 01/14/2017  .   Chronic jaw pain  10/27/2016  . Prediabetes 08/06/2016  . Chest pain 05/07/2016  . Subluxation of extensor carpi ulnaris tendon, left, initial encounter 03/24/2016  . Left lumbar radiculopathy 02/12/2016  . Post-concussion headache 01/25/2016  . Achilles tendinosis 01/17/2016  . Pain in left wrist 01/17/2016  . Pain of left heel 12/26/2015  . Cervical radiculopathy at C5 12/13/2015  . Low back pain 12/13/2015  . Leg cramps 12/13/2015  . Anxiety 10/23/2015  . Gait instability 10/23/2015  . Edema 10/23/2015  . Encounter for therapeutic drug monitoring 10/30/2014  . Carotid stenosis 09/06/2012  . Metabolic syndrome 11/28/2011  . Lupus anticoagulant with hypercoagulable state ([facility]) 06/23/2011  . CVA (cerebrovascular accident) ([facility]) 03/06/2011  . Hematuria 02/11/2010  . 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 Hyperlipidemia 12/04/2006  . Depression 12/04/2006  . Essential hypertension 12/04/2006    C.  PT, DPT 05/14/17 4:29 PM   [facility] [address] [place], [place], [address] Phone: [phone]   Fax:  [phone]  Name: Amanda Turner MRN: [id number] Date of Birth: 11/08/1945  PHYSICAL THERAPY DISCHARGE SUMMARY  Visits from Start of Care: 4  Current functional level related to goals / functional outcomes: See above   Remaining deficits: See above   Education / Equipment: Anatomy of condition, POC, HEP, exercise form/rationale  Plan: Patient agrees to discharge.  Patient goals were not met. Patient is being discharged due to not returning since the last visit.  ?????      C.  PT, DPT 06/09/17 4:21 PM    

## 2017-05-18 ENCOUNTER — Encounter: Payer: Self-pay | Admitting: Physical Therapy

## 2017-05-18 ENCOUNTER — Encounter: Payer: Self-pay | Admitting: Internal Medicine

## 2017-05-19 ENCOUNTER — Encounter: Payer: Self-pay | Admitting: Internal Medicine

## 2017-05-20 ENCOUNTER — Encounter: Payer: Self-pay | Admitting: Internal Medicine

## 2017-05-20 ENCOUNTER — Encounter (INDEPENDENT_AMBULATORY_CARE_PROVIDER_SITE_OTHER): Payer: Self-pay | Admitting: Orthopaedic Surgery

## 2017-05-20 DIAGNOSIS — M791 Myalgia, unspecified site: Secondary | ICD-10-CM

## 2017-05-21 ENCOUNTER — Encounter: Payer: Self-pay | Admitting: Physical Therapy

## 2017-05-25 ENCOUNTER — Encounter (INDEPENDENT_AMBULATORY_CARE_PROVIDER_SITE_OTHER): Payer: Self-pay | Admitting: Orthopaedic Surgery

## 2017-05-25 ENCOUNTER — Ambulatory Visit (INDEPENDENT_AMBULATORY_CARE_PROVIDER_SITE_OTHER): Payer: Medicare Other | Admitting: Orthopaedic Surgery

## 2017-05-25 DIAGNOSIS — M25532 Pain in left wrist: Secondary | ICD-10-CM | POA: Diagnosis not present

## 2017-05-25 NOTE — Progress Notes (Signed)
Office Visit Note   Patient: Amanda Turner           Date of Birth: 1945/08/24           MRN: [id number] Visit Date: 05/25/2017              Requested by: [name], MD [place], [place] [address] PCP: [name], MD   Assessment & Plan: Visit Diagnoses:  1. Left wrist pain     Plan: Impression is left wrist ulnar impaction syndrome with temporary relief from steroid injection.  MRI findings were reviewed with the patient.  At this point referral to Dr. Grandville Silos to see if scope debridement would be of any benefit.  Follow-Up Instructions: Return if symptoms worsen or fail to improve.   Orders:  Orders Placed This Encounter  Procedures  . Ambulatory referral to Orthopedic Surgery   No orders of the defined types were placed in this encounter.     Procedures: No procedures performed   Clinical Data: No additional findings.   Subjective: Chief Complaint  Patient presents with  . Left Wrist - Pain    Patient follows up today for left wrist pain.  She had temporary relief from left wrist injection on 02/21/2017.  She has pain that radiates up into her forearm.    Review of Systems   Objective: Vital Signs: There were no vitals taken for this visit.  Physical Exam  Ortho Exam Left wrist exam stable impression is ulnar impaction syndrome Specialty Comments: O  Imaging: No results found.   PMFS History: Patient Active Problem List   Diagnosis Date Noted  . 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 Long term (current) use of anticoagulants 01/14/2017  . Chronic jaw pain 10/27/2016  . Prediabetes 08/06/2016  . Chest pain 05/07/2016  . Subluxation of extensor carpi ulnaris tendon, left, initial encounter 03/24/2016  . Left lumbar radiculopathy 02/12/2016  . Post-concussion headache 01/25/2016  . Achilles tendinosis 01/17/2016  . Pain in left wrist 01/17/2016  . Pain of left heel 12/26/2015  . Cervical radiculopathy at C5  12/13/2015  . Low back pain 12/13/2015  . Leg cramps 12/13/2015  . Anxiety 10/23/2015  . Gait instability 10/23/2015  . Edema 10/23/2015  . Encounter for therapeutic drug monitoring 10/30/2014  . Carotid stenosis 09/06/2012  . Metabolic syndrome 83/38/2505  . Lupus anticoagulant with hypercoagulable state ([place]) 06/23/2011  . CVA (cerebrovascular accident) ([place]) 03/06/2011  . Hematuria 02/11/2010  . 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 Hyperlipidemia 12/04/2006  . Depression 12/04/2006  . Essential hypertension 12/04/2006   Past Medical History:  Diagnosis Date  . Anxiety   . 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 Depression   . Elevated LFT's   . HA (headache)   . Hyperlipemia   . Hypertension   . Intracranial hemorrhage, spontaneous intraparenchymal, associated with coagulopathy, remote, resolved 06/23/2011  . Lupus anticoagulant disorder ([place])   . Lupus anticoagulant with hypercoagulable state ([place]) 06/23/2011  . Lupus erythematosus 06/23/2011  . Stroke [facility])     Family History  Problem Relation Age of Onset  . Diabetes Brother   . Diabetes Sister   . Hypertension Mother   . Dementia Mother   . Bladder Cancer Brother   . Colon cancer Neg Hx     Past Surgical History:  Procedure Laterality Date  . ABDOMINAL HYSTERECTOMY    . TRANSPHENOIDAL PITUITARY RESECTION     Social  History   Occupational History    Employer: UNEMPLOYED  Tobacco Use  . Smoking status: Never Smoker  . Smokeless tobacco: Never Used  Substance and Sexual Activity  . Alcohol use: No    Alcohol/week: 0.0 oz  . Drug use: No  . Sexual activity: Not on file

## 2017-05-27 ENCOUNTER — Ambulatory Visit (INDEPENDENT_AMBULATORY_CARE_PROVIDER_SITE_OTHER): Payer: Medicare Other | Admitting: General Practice

## 2017-05-27 ENCOUNTER — Encounter (INDEPENDENT_AMBULATORY_CARE_PROVIDER_SITE_OTHER): Payer: Self-pay | Admitting: Orthopaedic Surgery

## 2017-05-27 DIAGNOSIS — Z7901 Long term (current) use of anticoagulants: Secondary | ICD-10-CM

## 2017-05-27 LAB — POCT INR: INR: 2.7

## 2017-05-27 NOTE — Patient Instructions (Addendum)
Pre visit review using our clinic review tool, if applicable. No additional management support is needed unless otherwise documented below in the visit note.  Continue to take 5mg daily.  Re-check in 4 weeks.    

## 2017-05-27 NOTE — Progress Notes (Signed)
I have reviewed and agree with this plan  

## 2017-05-28 ENCOUNTER — Telehealth (INDEPENDENT_AMBULATORY_CARE_PROVIDER_SITE_OTHER): Payer: Self-pay | Admitting: Orthopaedic Surgery

## 2017-05-28 NOTE — Telephone Encounter (Signed)
RECORDS FAXED TO [facility] ATTN: MEREDITH. [phone], PER PH REQUEST. DR Erlinda Hong REFERRED PATIENT TO DR. THOMPSON

## 2017-05-29 ENCOUNTER — Other Ambulatory Visit: Payer: Self-pay | Admitting: Internal Medicine

## 2017-05-29 ENCOUNTER — Encounter: Payer: Self-pay | Admitting: Internal Medicine

## 2017-05-30 NOTE — Progress Notes (Signed)
Agree with management.  Vinh Sachs J Konor Noren, MD  

## 2017-06-04 ENCOUNTER — Encounter (INDEPENDENT_AMBULATORY_CARE_PROVIDER_SITE_OTHER): Payer: Self-pay | Admitting: Orthopaedic Surgery

## 2017-06-04 DIAGNOSIS — M25532 Pain in left wrist: Secondary | ICD-10-CM | POA: Diagnosis not present

## 2017-06-04 DIAGNOSIS — M19032 Primary osteoarthritis, left wrist: Secondary | ICD-10-CM | POA: Diagnosis not present

## 2017-06-05 ENCOUNTER — Other Ambulatory Visit (INDEPENDENT_AMBULATORY_CARE_PROVIDER_SITE_OTHER): Payer: Self-pay

## 2017-06-05 DIAGNOSIS — M25532 Pain in left wrist: Secondary | ICD-10-CM

## 2017-06-05 NOTE — Telephone Encounter (Signed)
yes

## 2017-06-09 ENCOUNTER — Other Ambulatory Visit: Payer: Self-pay | Admitting: Orthopedic Surgery

## 2017-06-09 DIAGNOSIS — M25512 Pain in left shoulder: Secondary | ICD-10-CM | POA: Diagnosis not present

## 2017-06-10 NOTE — Telephone Encounter (Signed)
Patient called concerning sending information for a second opinion to Dr. Lucie Leather office, they have not received it yet. Patient gave me a contact for that office: Robin  phone # [phone]. Thanks!

## 2017-06-11 ENCOUNTER — Encounter (INDEPENDENT_AMBULATORY_CARE_PROVIDER_SITE_OTHER): Payer: Self-pay | Admitting: Orthopaedic Surgery

## 2017-06-11 ENCOUNTER — Encounter: Payer: Self-pay | Admitting: Neurology

## 2017-06-13 LAB — POCT INR
INR: 2.9
INR: 2.9

## 2017-06-14 ENCOUNTER — Ambulatory Visit
Admission: RE | Admit: 2017-06-14 | Discharge: 2017-06-14 | Disposition: A | Discharge status: Home / Self Care | Payer: Medicare Other | Source: Ambulatory Visit | Attending: Orthopedic Surgery | Admitting: Orthopedic Surgery

## 2017-06-14 DIAGNOSIS — M25512 Pain in left shoulder: Secondary | ICD-10-CM

## 2017-06-14 DIAGNOSIS — M75112 Incomplete rotator cuff tear or rupture of left shoulder, not specified as traumatic: Secondary | ICD-10-CM | POA: Diagnosis not present

## 2017-06-15 ENCOUNTER — Encounter (INDEPENDENT_AMBULATORY_CARE_PROVIDER_SITE_OTHER): Payer: Self-pay | Admitting: Orthopaedic Surgery

## 2017-06-15 ENCOUNTER — Other Ambulatory Visit (INDEPENDENT_AMBULATORY_CARE_PROVIDER_SITE_OTHER): Payer: Self-pay

## 2017-06-15 ENCOUNTER — Telehealth (INDEPENDENT_AMBULATORY_CARE_PROVIDER_SITE_OTHER): Payer: Self-pay | Admitting: Orthopaedic Surgery

## 2017-06-15 DIAGNOSIS — M25532 Pain in left wrist: Secondary | ICD-10-CM

## 2017-06-15 NOTE — Telephone Encounter (Signed)
Patient called advised she wanted to see Dr. Fredna Dow but he declined because the note said it was for a second opinion. Patient advised she was sent to another hand specialist (Dr. Grandville Silos) and she did not want to see Dr. Grandville Silos. The number to contact patient is [phone]

## 2017-06-15 NOTE — Telephone Encounter (Signed)
Sure thing.  Please refer to [place].

## 2017-06-15 NOTE — Telephone Encounter (Signed)
Per Dr. Erlinda Hong, refer to [facility]. Do not refer for 2nd opinion. Eval and treat.

## 2017-06-15 NOTE — Telephone Encounter (Signed)
Referral entered  

## 2017-06-16 ENCOUNTER — Telehealth: Payer: Self-pay | Admitting: General Practice

## 2017-06-16 ENCOUNTER — Encounter (INDEPENDENT_AMBULATORY_CARE_PROVIDER_SITE_OTHER): Payer: Self-pay | Admitting: Orthopaedic Surgery

## 2017-06-16 NOTE — Telephone Encounter (Signed)
-----   Message from [name], MD sent at 06/16/2017  2:51 PM EST ----- Regarding: RE: Lovenox bridge Yes, thank you so much!  SB ----- Message ----- From: Warden Fillers, RN Sent: 06/16/2017   1:50 PM To: [name], MD Subject: Lovenox bridge                                 Dr. Quay Burow,  Per our discussion earlier, I would recommend that pt have Lovenox bridge due to having two risk factors.  Do I have your permission to dose and call in the medication?  Thanks, Villa Herb, RN

## 2017-06-17 NOTE — Telephone Encounter (Signed)
Sent referral to Dr. Alois Cliche by email to [facility]

## 2017-06-17 NOTE — Telephone Encounter (Signed)
This encounter was created in error - please disregard.

## 2017-06-19 ENCOUNTER — Telehealth: Payer: Self-pay | Admitting: General Practice

## 2017-06-19 ENCOUNTER — Other Ambulatory Visit: Payer: Self-pay

## 2017-06-19 NOTE — Telephone Encounter (Signed)
2nd attempt to reach patient.  LMOVM.

## 2017-06-22 DIAGNOSIS — M7502 Adhesive capsulitis of left shoulder: Secondary | ICD-10-CM | POA: Diagnosis not present

## 2017-06-30 ENCOUNTER — Encounter: Payer: Self-pay | Admitting: Physical Therapy

## 2017-06-30 ENCOUNTER — Other Ambulatory Visit: Payer: Self-pay

## 2017-06-30 ENCOUNTER — Ambulatory Visit: Payer: Medicare Other | Attending: Orthopedic Surgery | Admitting: Physical Therapy

## 2017-06-30 DIAGNOSIS — G8929 Other chronic pain: Secondary | ICD-10-CM | POA: Insufficient documentation

## 2017-06-30 DIAGNOSIS — M25612 Stiffness of left shoulder, not elsewhere classified: Secondary | ICD-10-CM | POA: Insufficient documentation

## 2017-06-30 DIAGNOSIS — M25512 Pain in left shoulder: Secondary | ICD-10-CM | POA: Insufficient documentation

## 2017-06-30 DIAGNOSIS — M6281 Muscle weakness (generalized): Secondary | ICD-10-CM | POA: Insufficient documentation
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
## 2017-07-01 ENCOUNTER — Ambulatory Visit (INDEPENDENT_AMBULATORY_CARE_PROVIDER_SITE_OTHER): Payer: Medicare Other | Admitting: General Practice

## 2017-07-01 DIAGNOSIS — Z7901 Long term (current) use of anticoagulants: Secondary | ICD-10-CM

## 2017-07-01 LAB — POCT INR: INR: 3.2

## 2017-07-01 NOTE — Patient Instructions (Signed)
Pre visit review using our clinic review tool, if applicable. No additional management support is needed unless otherwise documented below in the visit note.  Skip coumadin today (2/27) and then continue to take 5 mg daily.  Re-check in 4 weeks.

## 2017-07-02 ENCOUNTER — Ambulatory Visit: Payer: Medicare Other | Admitting: Physical Therapy

## 2017-07-02 DIAGNOSIS — M6281 Muscle weakness (generalized): Secondary | ICD-10-CM | POA: Diagnosis not present

## 2017-07-02 DIAGNOSIS — M25612 Stiffness of left shoulder, not elsewhere classified: Secondary | ICD-10-CM

## 2017-07-02 DIAGNOSIS — G8929 Other chronic pain: Secondary | ICD-10-CM | POA: Diagnosis not present

## 2017-07-02 DIAGNOSIS — M25512 Pain in left shoulder: Secondary | ICD-10-CM | POA: Diagnosis not present
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
## 2017-07-03 ENCOUNTER — Other Ambulatory Visit: Payer: Self-pay | Admitting: Internal Medicine

## 2017-07-03 MED ORDER — ACETAMINOPHEN-CODEINE 300-30 MG PO TABS: 1.0000 | ORAL_TABLET | Freq: Three times a day (TID) | ORAL | 0 refills | Status: DC | PRN

## 2017-07-03 MED ORDER — ALPRAZOLAM 0.5 MG PO TABS: ORAL_TABLET | ORAL | 0 refills | Status: DC

## 2017-07-03 NOTE — Telephone Encounter (Signed)
[place] Controlled Substance Database checked. Last filled on 04/16/17 #90

## 2017-07-03 NOTE — Telephone Encounter (Signed)
[place] Controlled Substance Database checked. Last filled on 02/18/17

## 2017-07-06 ENCOUNTER — Encounter: Payer: Self-pay | Admitting: Internal Medicine

## 2017-07-07 DIAGNOSIS — M24132 Other articular cartilage disorders, left wrist: Secondary | ICD-10-CM | POA: Diagnosis not present

## 2017-07-07 MED ORDER — ACETAMINOPHEN-CODEINE 300-30 MG PO TABS: 1.0000 | ORAL_TABLET | Freq: Three times a day (TID) | ORAL | 0 refills | Status: DC | PRN

## 2017-07-07 NOTE — Telephone Encounter (Signed)
Please resend to [employer]. Contacted [facility] and cancelled RX there.

## 2017-07-09 ENCOUNTER — Ambulatory Visit: Payer: Medicare Other | Attending: Orthopedic Surgery | Admitting: Physical Therapy

## 2017-07-09 ENCOUNTER — Encounter: Payer: Self-pay | Admitting: Physical Therapy

## 2017-07-09 DIAGNOSIS — G8929 Other chronic pain: Secondary | ICD-10-CM | POA: Diagnosis not present

## 2017-07-09 DIAGNOSIS — M25612 Stiffness of left shoulder, not elsewhere classified: Secondary | ICD-10-CM | POA: Diagnosis not present

## 2017-07-09 DIAGNOSIS — M542 Cervicalgia: Secondary | ICD-10-CM | POA: Diagnosis not present

## 2017-07-09 DIAGNOSIS — M25512 Pain in left shoulder: Secondary | ICD-10-CM | POA: Diagnosis not present

## 2017-07-09 DIAGNOSIS — M6281 Muscle weakness (generalized): Secondary | ICD-10-CM | POA: Diagnosis not present
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
## 2017-07-12 ENCOUNTER — Encounter: Payer: Self-pay | Admitting: Family Medicine

## 2017-07-14 ENCOUNTER — Encounter: Payer: Self-pay | Admitting: Physical Therapy

## 2017-07-16 ENCOUNTER — Ambulatory Visit: Payer: Medicare Other | Admitting: Physical Therapy

## 2017-07-16 ENCOUNTER — Encounter: Payer: Self-pay | Admitting: Physical Therapy

## 2017-07-16 DIAGNOSIS — M25612 Stiffness of left shoulder, not elsewhere classified: Secondary | ICD-10-CM | POA: Diagnosis not present

## 2017-07-16 DIAGNOSIS — M25512 Pain in left shoulder: Principal | ICD-10-CM

## 2017-07-16 DIAGNOSIS — G8929 Other chronic pain: Secondary | ICD-10-CM | POA: Diagnosis not present

## 2017-07-16 DIAGNOSIS — M6281 Muscle weakness (generalized): Secondary | ICD-10-CM | POA: Diagnosis not present

## 2017-07-16 DIAGNOSIS — M542 Cervicalgia: Secondary | ICD-10-CM | POA: Diagnosis not present

## 2017-07-16 NOTE — Therapy (Addendum)
[facility] [address] [place], [place], [address] Phone: [phone]   Fax:  [phone]  Physical Therapy Treatment/Discharge Summary  Patient Details  Name: Amanda Turner MRN: [id number] Date of Birth: [birth year] Referring Provider: Dr. Almedia Balls   Encounter Date: 07/16/2017  PT End of Session - 07/16/17 1729    Visit Number  4    Number of Visits  17    Date for PT Re-Evaluation  08/25/17    Authorization Type  UHC Medicare:  KX at visit 12 (previous PT this year)    PT Start Time  1528    PT Stop Time  1608    PT Time Calculation (min)  40 min    Activity Tolerance  Patient tolerated treatment well    Behavior During Therapy  [facility] for tasks assessed/performed       Past Medical History:  Diagnosis Date  . Anxiety   . 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 Depression   . Elevated LFT's   . HA (headache)   . Hyperlipemia   . Hypertension   . Intracranial hemorrhage, spontaneous intraparenchymal, associated with coagulopathy, remote, resolved 06/23/2011  . Lupus anticoagulant disorder ([place])   . Lupus anticoagulant with hypercoagulable state ([place]) 06/23/2011  . Lupus erythematosus 06/23/2011  . Stroke [facility])     Past Surgical History:  Procedure Laterality Date  . ABDOMINAL HYSTERECTOMY    . TRANSPHENOIDAL PITUITARY RESECTION      There were no vitals filed for this visit.  Subjective Assessment - 07/16/17 1727    Subjective  The tape made my shoulder feel better and it lasted about 3 days.  I feel 10% better overall and I will be happy with 50% better.    Patient Stated Goals  I don't want to have the pain;  be able to use my left arm to do things    Currently in Pain?  Yes    Pain Score  7     Pain Location  Shoulder    Pain Orientation  Left    Pain Descriptors / Indicators  Aching;Cramping    Pain Type  Chronic pain    Pain Onset  More than a month  ago    Pain Frequency  Constant    Aggravating Factors   reaching up and back    Pain Relieving Factors  tape, tylenol    Multiple Pain Sites  No         [facility] PT Assessment - 07/16/17 0001      AROM   Left Shoulder Flexion  85 Degrees    Left Shoulder ABduction  75 Degrees                  [facility] Adult PT Treatment/Exercise - 07/16/17 0001      Neck Exercises: Standing   Other Standing Exercises  shoulder UE ranger level 1 on wall x 2 min; 2nd step x 2 min      Shoulder Exercises: Seated   Flexion  AAROM;20 reps;Both table slides      Shoulder Exercises: Pulleys   Flexion  3 minutes      Shoulder Exercises: ROM/Strengthening   Other ROM/Strengthening Exercises  standing pendulum      Manual Therapy   Kinesiotex  Facilitate Muscle      Kinesiotix   Facilitate Muscle   2 vertical strips anterior and posterior deltoid and horizontal across middle deltoid; added  one strip down left Upper trap to decrease muscle activity             PT Education - 07/16/17 1610    Education provided  Yes    Education Details  pulleys, table slide, pendulums    Person(s) Educated  Patient    Methods  Explanation;Demonstration;Tactile cues;Verbal cues;Handout    Comprehension  Verbalized understanding;Returned demonstration       PT Short Term Goals - 07/16/17 1537      PT SHORT TERM GOAL #1   Title  The patient will be able to perform initial HEP needed for ROM to decrease stiffness    Time  4    Period  Weeks    Status  On-going      PT SHORT TERM GOAL #2   Title  Patient will have 80 degrees of left shoulder elevation needed for putting on her earring with left arm    Baseline  85 degrees flexion    Time  4    Period  Weeks    Status  Achieved      PT SHORT TERM GOAL #3   Baseline  SB right 10 deg; left 20 deg; flexion 15 deg; ext 15 degrees    Time  4    Period  Weeks    Status  On-going      PT SHORT TERM GOAL #4   Title  Left elbow extension lacking  15 degrees or less needed for reaching     Baseline  lacking 4 degrees    Time  4    Period  Weeks    Status  Achieved      PT SHORT TERM GOAL #5   Title  Left shoulder pain improved by 25% with basic ADLs    Baseline  it felt better with the tape; overall 10% improved    Time  4    Period  Weeks    Status  On-going        PT Long Term Goals - 06/30/17 1729      PT LONG TERM GOAL #1   Title  The patient will be independent in safe self progression of HEP for further improvements in shoulder ROM and strength    Time  8    Period  Weeks    Status  New    Target Date  08/25/17      PT LONG TERM GOAL #2   Title  The patient will have 95 degrees shoulder flexion and abduction  needed for reaching shoulder height shelf    Time  8    Period  Weeks    Status  New      PT LONG TERM GOAL #3   Title  The patient will be able to reach the lumbar region of her back with her left arm for bathing and dressing    Time  8    Period  Weeks    Status  New      PT LONG TERM GOAL #4   Title  The patient will have left UE strength grossly 3/5 needed for 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  She was able to progress HEP today and performed all execises without increased pain. Pt demonstrates improvements in AROM and met short term goal for increased sholder flexion.  Pt was able to tolerate UE ranger on the wall today and felt  some muscle fatigue but no pain in the shoulder.  pt will benefit from skilled PT to continue progression shoulder ROM and strength with attention to reduced shoulder elevation due to upper trap guarding.    Clinical Impairments Affecting Rehab Potential  pain chronicity and severity;  did not like dry needling in the past;  wrist surgery 3/20    PT Treatment/Interventions  ADLs/Self Care Home Management;Iontophoresis 57m/ml Dexamethasone;Neuromuscular re-education;Taping;Manual techniques;Moist Heat;Cryotherapy;Electrical Stimulation;Therapeutic activities;Patient/family education;Ultrasound;Therapeutic exercise    PT Next Visit Plan  progress UE ranger on wall as tolerated, review HEP as needed, continue kinesio tape, continue AAROM and progress as tolerated    Consulted and Agree with Plan of Care  Patient       PHYSICAL THERAPY DISCHARGE SUMMARY  Visits from Start of Care: 4  Current functional level related to goals / functional outcomes: The patient called to request discharge from PT stating, "Dr. VLynann Bolognawants to do surgery."     Remaining deficits: As above   Education / Equipment: Initial HEP Plan: Patient agrees to discharge.  Patient goals were not met. Patient is being discharged due to the patient's request.  ?????         Patient will benefit from skilled therapeutic intervention in order to improve the following deficits and impairments:  Pain, Increased muscle spasms, Postural dysfunction, Decreased activity tolerance, Decreased range of motion, Decreased strength, Impaired perceived functional ability, Impaired UE functional use, Increased fascial restricitons  Visit Diagnosis: Chronic left shoulder pain  Stiffness of left shoulder, not elsewhere classified  Muscle weakness (generalized)     Problem List Patient Active Problem List   Diagnosis Date Noted  . 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 Long term (current) use of anticoagulants 01/14/2017   . Chronic jaw pain 10/27/2016  . Prediabetes 08/06/2016  . Chest pain 05/07/2016  . Subluxation of extensor carpi ulnaris tendon, left, initial encounter 03/24/2016  . Left lumbar radiculopathy 02/12/2016  . Post-concussion headache 01/25/2016  . Achilles tendinosis 01/17/2016  . Pain in left wrist 01/17/2016  . Pain of left heel 12/26/2015  . Cervical radiculopathy at C5 12/13/2015  . Low back pain 12/13/2015  . Leg cramps 12/13/2015  . Anxiety 10/23/2015  . Gait instability 10/23/2015  . Edema 10/23/2015  . Encounter for therapeutic drug monitoring 10/30/2014  . Carotid stenosis 09/06/2012  . Metabolic syndrome 092/42/6834 . Lupus anticoagulant with hypercoagulable state ([place] 06/23/2011  . CVA (cerebrovascular accident) ([place] 03/06/2011  . Hematuria 02/11/2010  . 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 Hyperlipidemia 12/04/2006  . Depression 12/04/2006  . Essential hypertension 12/04/2006   SRuben Im PT 07/28/17 10:54 AM Phone: [phone]: [phone] JZannie Cove,  PT 07/16/2017, 5:36 PM  [facility] [address], [place], [place], [address] Phone: [phone]   Fax:  [phone]  Name: Amanda Turner MRN: [id number] Date of Birth: [birth year]

## 2017-07-16 NOTE — Patient Instructions (Addendum)
   Shoulder pulleys: find on [place], [employer]     TABLE SLIDE - FLEXION  Sitting in a chair, rest your injured arm on a table and gently slide it forward and then back.  Pendulum Circular    Bend forward 90 at waist, leaning on table for support. Rock body in a circular pattern to move arm clockwise __10__ times then counterclockwise _10___ times. Do _3___ sessions per day.  Copyright  VHI. All rights reserved.    [facility] [address], [place], [place] [address] Phone # [phone] Fax [phone]

## 2017-07-20 ENCOUNTER — Encounter: Payer: Self-pay | Admitting: Internal Medicine

## 2017-07-20 ENCOUNTER — Ambulatory Visit (INDEPENDENT_AMBULATORY_CARE_PROVIDER_SITE_OTHER): Payer: Medicare Other | Admitting: General Practice

## 2017-07-20 DIAGNOSIS — Z7901 Long term (current) use of anticoagulants: Secondary | ICD-10-CM | POA: Diagnosis not present

## 2017-07-20 DIAGNOSIS — M7502 Adhesive capsulitis of left shoulder: Secondary | ICD-10-CM | POA: Diagnosis not present

## 2017-07-20 LAB — POCT INR: INR: 2.9

## 2017-07-20 NOTE — Patient Instructions (Addendum)
Pre visit review using our clinic review tool, if applicable. No additional management support is needed unless otherwise documented below in the visit note.  Take 1/2 tablet today (2.5 mg) and then continue to take 5 mg daily.  Re-check in 4 weeks.

## 2017-07-21 ENCOUNTER — Encounter: Payer: Self-pay | Admitting: Physical Therapy

## 2017-07-22 ENCOUNTER — Emergency Department (HOSPITAL_COMMUNITY)
Admission: EM | Admit: 2017-07-22 | Discharge: 2017-07-23 | Disposition: A | Discharge status: Home / Self Care | Payer: Medicare Other | Attending: Emergency Medicine | Admitting: Emergency Medicine

## 2017-07-22 ENCOUNTER — Other Ambulatory Visit: Payer: Self-pay

## 2017-07-22 ENCOUNTER — Encounter (HOSPITAL_COMMUNITY): Payer: Self-pay | Admitting: *Deleted

## 2017-07-22 DIAGNOSIS — I10 Essential (primary) hypertension: Secondary | ICD-10-CM | POA: Insufficient documentation

## 2017-07-22 DIAGNOSIS — R200 Anesthesia of skin: Secondary | ICD-10-CM | POA: Diagnosis not present

## 2017-07-22 DIAGNOSIS — M19132 Post-traumatic osteoarthritis, left wrist: Secondary | ICD-10-CM | POA: Diagnosis not present

## 2017-07-22 DIAGNOSIS — Z7901 Long term (current) use of anticoagulants: Secondary | ICD-10-CM | POA: Diagnosis not present

## 2017-07-22 DIAGNOSIS — Z7982 Long term (current) use of aspirin: Secondary | ICD-10-CM | POA: Insufficient documentation

## 2017-07-22 DIAGNOSIS — S63592A Other specified sprain of left wrist, initial encounter: Secondary | ICD-10-CM | POA: Diagnosis not present

## 2017-07-22 DIAGNOSIS — G8918 Other acute postprocedural pain: Secondary | ICD-10-CM

## 2017-07-22 DIAGNOSIS — M25532 Pain in left wrist: Secondary | ICD-10-CM | POA: Diagnosis not present

## 2017-07-22 DIAGNOSIS — Z79899 Other long term (current) drug therapy: Secondary | ICD-10-CM | POA: Diagnosis not present

## 2017-07-22 DIAGNOSIS — Z91010 Allergy to peanuts: Secondary | ICD-10-CM | POA: Insufficient documentation

## 2017-07-22 DIAGNOSIS — M79602 Pain in left arm: Secondary | ICD-10-CM | POA: Diagnosis present

## 2017-07-22 NOTE — ED Triage Notes (Signed)
The pt is c/o lt forearm pain since she had surgery at the  [facility]urgical [facility] earlier today.  She has throbbing in her fingers and is c/o severe pain  The wrap on the ooutside fo her cast has been loosened per order of dr Burney Gauze who did her surgery.  Her husband reports that it appears that the cast it too tight.  Additional pain medicine called in is not helping her pain.  She reports that her fingers are swollen  Good cap refill at present

## 2017-07-23 ENCOUNTER — Telehealth: Payer: Self-pay | Admitting: *Deleted

## 2017-07-23 ENCOUNTER — Encounter: Payer: Self-pay | Admitting: Physical Therapy

## 2017-07-23 MED ORDER — KETOROLAC TROMETHAMINE 60 MG/2ML IM SOLN
INTRAMUSCULAR | Status: AC
  Filled 2017-07-23: qty 2

## 2017-07-23 NOTE — Telephone Encounter (Signed)
Pt was on TCM report present to ED 07/22/17 with pain to left wrist post procedure that had already been done.Pt D/C 07/23/17 with recommendation to elevate, and take prescribed pain medications as needed. Follow up with Dr. Burney Gauze tomorrow morning if pain persists...Amanda Turner

## 2017-07-23 NOTE — Progress Notes (Signed)
Orthopedic Tech Progress Note Patient Details:  Amanda Turner 29-Oct-1945 [id number]  Ortho Devices Type of Ortho Device: Volar splint Ortho Device/Splint Location: reapplication of removed splint as per drs verbal order. Ortho Device/Splint Interventions: Ordered, Application   Post Interventions Patient Tolerated: Well Instructions Provided: Care of device, Adjustment of device   [name] 07/23/2017, 4:58 AM

## 2017-07-23 NOTE — ED Notes (Signed)
Pt DC during downtime 

## 2017-07-23 NOTE — ED Provider Notes (Signed)
[place] EMERGENCY DEPARTMENT Provider Note   CSN: [id number] Arrival date & time: 07/22/17  2216     History   Chief Complaint Chief Complaint  Patient presents with  . arm pain    HPI Amanda Turner is a 72 y.o. female presenting to the ED with pain to left wrist. Patient is postop today, following a procedure done by Dr. Burney Gauze on her left wrist.  She is unable to provide details of the procedure, however states she had broken her wrist 2 years ago, and he was "cleaning up the edges" of her left wrist joint.  She states following the procedure, she began having worsening pain to the left hand and arm.  She states she followed up with Dr. Burney Gauze, who recommended she loosen the wrap around her cast, as well as prescribed her pain medication.  She states that neither of those interventions have helped.  She reports persistent severe pain to the left wrist, and mild numbness and swelling of the digits.  Pain is worse with any movement of the arm.    The history is provided by the patient.    Past Medical History:  Diagnosis Date  . Anxiety   . 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 Depression   . Elevated LFT's   . HA (headache)   . Hyperlipemia   . Hypertension   . Intracranial hemorrhage, spontaneous intraparenchymal, associated with coagulopathy, remote, resolved 06/23/2011  . Lupus anticoagulant disorder ([place])   . Lupus anticoagulant with hypercoagulable state ([place]) 06/23/2011  . Lupus erythematosus 06/23/2011  . Stroke [facility])     Patient Active Problem List   Diagnosis Date Noted  . 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 Long term (current) use of anticoagulants 01/14/2017  . Chronic jaw pain 10/27/2016  . Prediabetes 08/06/2016  . Chest pain 05/07/2016  . Subluxation of extensor carpi ulnaris tendon, left, initial encounter 03/24/2016  . Left lumbar radiculopathy 02/12/2016  . Post-concussion  headache 01/25/2016  . Achilles tendinosis 01/17/2016  . Pain in left wrist 01/17/2016  . Pain of left heel 12/26/2015  . Cervical radiculopathy at C5 12/13/2015  . Low back pain 12/13/2015  . Leg cramps 12/13/2015  . Anxiety 10/23/2015  . Gait instability 10/23/2015  . Edema 10/23/2015  . Encounter for therapeutic drug monitoring 10/30/2014  . Carotid stenosis 09/06/2012  . Metabolic syndrome 69/67/8938  . Lupus anticoagulant with hypercoagulable state ([place]) 06/23/2011  . CVA (cerebrovascular accident) ([place]) 03/06/2011  . Hematuria 02/11/2010  . 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 Hyperlipidemia 12/04/2006  . Depression 12/04/2006  . Essential hypertension 12/04/2006    Past Surgical History:  Procedure Laterality Date  . ABDOMINAL HYSTERECTOMY    . TRANSPHENOIDAL PITUITARY RESECTION      OB History   None      Home Medications    Prior to Admission medications   Medication Sig Start Date End Date Taking? Authorizing Provider  Acetaminophen-Codeine (TYLENOL/CODEINE #3) 300-30 MG tablet Take 1 tablet by mouth every 8 (eight) hours as needed for pain. 07/07/17   [name], MD  ALPRAZolam Duanne Moron) 0.5 MG tablet Take 0.5 tab in morning and one tab in evening 07/03/17   [name], MD  aspirin 81 MG tablet Take 81 mg by mouth daily.      [provider]  DULoxetine (CYMBALTA) 30 MG capsule Take 1 capsule (30 mg total) by mouth 2 (two) times daily. 05/13/17  [name], MD  famotidine (PEPCID) 20 MG tablet TAKE 1 TABLET BY MOUTH TWO  TIMES DAILY 05/29/17   [name], MD  furosemide (LASIX) 40 MG tablet TAKE 1 TABLET BY MOUTH  DAILY 05/29/17   [name], MD  lisinopril (PRINIVIL,ZESTRIL) 5 MG tablet TAKE 1 TABLET BY MOUTH  DAILY 05/29/17   [name], MD  oseltamivir (TAMIFLU) 75 MG capsule Take 1 capsule (75 mg total) by mouth 2 (two) times daily. Patient not taking: Reported on 06/30/2017 05/08/17   Marrian Salvage, FNP  potassium  chloride SA (K-DUR,KLOR-CON) 20 MEQ tablet TAKE 1 TABLET BY MOUTH TWO  TIMES DAILY 05/29/17   [name], MD  simvastatin (ZOCOR) 20 MG tablet TAKE 1 TABLET BY MOUTH AT  BEDTIME 05/29/17   Burns, Claudina Lick, MD  tiZANidine (ZANAFLEX) 4 MG tablet Take 1 tablet (4 mg total) by mouth at bedtime. 02/04/17   [name], MD  warfarin (COUMADIN) 5 MG tablet Take 1 tablet (5 mg total) by mouth daily. 04/03/17   [name], MD    Family History Family History  Problem Relation Age of Onset  . Diabetes Brother   . Diabetes Sister   . Hypertension Mother   . Dementia Mother   . Bladder Cancer Brother   . Colon cancer Neg Hx     Social History Social History   Tobacco Use  . Smoking status: Never Smoker  . Smokeless tobacco: Never Used  Substance Use Topics  . Alcohol use: No    Alcohol/week: 0.0 oz  . Drug use: No     Allergies   Gabapentin; Cymbalta [duloxetine hcl]; Lisinopril; Losartan; Peanut oil; Shrimp flavor; and Sulfonamide derivatives   Review of Systems Review of Systems  Constitutional: Negative for fever.  Musculoskeletal: Positive for arthralgias, joint swelling and myalgias.  Neurological: Positive for numbness.  All other systems reviewed and are negative.    Physical Exam Updated Vital Signs BP 118/67 (BP Location: Right Arm)   Pulse 64   Temp 97.8 F (36.6 C) (Oral)   Resp 16   Ht 5\' 4"  (1.626 m)   Wt 86.2 kg (190 lb)   SpO2 99%   BMI 32.61 kg/m   Physical Exam  Constitutional: She appears well-developed and well-nourished. No distress.  Pt appears anxious  HENT:  Head: Normocephalic and atraumatic.  Eyes: Conjunctivae are normal.  Cardiovascular: Normal rate and intact distal pulses.  Intact radial and ulnar pulses LUE, easily palpable  Pulmonary/Chest: Effort normal.  Abdominal: Soft.  Musculoskeletal:  Left forearm in splint and sling. Splint removed, with surgical site over ulnar aspect of dorsum of wrist. No purulent drainage, not  actively bleeding. Some surrounding swelling noted, with significant tenderness. Compartments are soft.  Pt moving digits. Hand feels cool with midly decreased sensation to digits. Capillary refill <2 sec on right, 2-3 sec on left.  Neurological: She is alert.  Skin: Skin is warm.  Psychiatric: She has a normal mood and affect. Her behavior is normal.  Nursing note and vitals reviewed.    ED Treatments / Results  Labs (all labs ordered are listed, but only abnormal results are displayed) Labs Reviewed - No data to display  EKG  EKG Interpretation None       Radiology No results found.  Procedures Procedures (including critical care time)  Medications Ordered in ED Medications - No data to display  30mg  IM Toradol ordered   Initial Impression / Assessment  and Plan / ED Course  I have reviewed the triage vital signs and the nursing notes.  Pertinent labs & imaging results that were available during my care of the patient were reviewed by me and considered in my medical decision making (see chart for details).  Clinical Course as of Jul 24 1223  Wed Jul 22, 2017  2359 Pt discussed with and evaluated by Dr. Randal Buba. Recommends contact with Dr. Burney Gauze, and need for possible arterial studies.   [JR]  Thu Jul 23, 2017  2536 Spoke with Dr. Burney Gauze, Pt had Arthroscopic debridement of ligament in wrist and removed 2cm of distal ulna. She had axillary nerve block.  She called today following procedure with pain, and he recommended she elevate, take pain meds. Exam findings discussed and he does not recommend imaging, states dec cap refill and dec sensation are 2/t nerve block. He is reassured with soft compartments and normal vital signs. Recommends arm is redressed and wrapped loosely with plaster splint on palmar aspect. Recommends toradol for pain in the Ed. Follow up in clinic tomorrow as needed. Discussed these recommendations with patient and she is agreeable to plan.   [JR]      Clinical Course User Index [JR] Robinson, [place] N, PA-C    Pt presenting to the ED w left wrist pain s/p arthroscopic surgery to left wrist that was done earlier today by Dr. Burney Gauze. Cast removed in the ED, with moderate swelling, soft compartments, hand is cool with cap refill of 2-3 sec left hand, and decreased sensation. Radial and ulnar pulses easily palpable. Pt evaluated by Dr. Randal Buba. Pt discussed w Dr. Burney Gauze, exam findings discussed. He reports she had axillary nerve block done today, which accounts for the decreased sensation and decreased capillary refill. He states an arterial study is not recommended, with reassurance of soft compartments. Recommends redress and splint, toradol for pain, and pt to follow up in clinic tomorrow morning as needed. Pt agreeable to plan. Pt discharged with recommendation to elevate, and take prescribed pain medications as needed. Follow up with Dr. Burney Gauze tomorrow morning if pain persists.   Discussed results, findings, treatment and follow up. Patient advised of return precautions. Patient verbalized understanding and agreed with plan.  Final Clinical Impressions(s) / ED Diagnoses   Final diagnoses:  Post-operative pain    ED Discharge Orders    None       Robinson, [place] N, PA-C 07/23/17 1225    Palumbo, April, MD 07/24/17 [phone]

## 2017-07-27 ENCOUNTER — Ambulatory Visit: Payer: Self-pay

## 2017-07-28 ENCOUNTER — Encounter: Payer: Self-pay | Admitting: Physical Therapy

## 2017-07-28 DIAGNOSIS — M25632 Stiffness of left wrist, not elsewhere classified: Secondary | ICD-10-CM | POA: Diagnosis not present

## 2017-07-28 DIAGNOSIS — M24132 Other articular cartilage disorders, left wrist: Secondary | ICD-10-CM | POA: Diagnosis not present

## 2017-07-28 DIAGNOSIS — M79642 Pain in left hand: Secondary | ICD-10-CM | POA: Diagnosis not present

## 2017-07-29 DIAGNOSIS — M25632 Stiffness of left wrist, not elsewhere classified: Secondary | ICD-10-CM | POA: Insufficient documentation

## 2017-07-30 ENCOUNTER — Encounter: Payer: Self-pay | Admitting: Physical Therapy

## 2017-08-04 DIAGNOSIS — M24132 Other articular cartilage disorders, left wrist: Secondary | ICD-10-CM | POA: Diagnosis not present

## 2017-08-06 ENCOUNTER — Encounter: Payer: Self-pay | Admitting: Internal Medicine

## 2017-08-12 ENCOUNTER — Other Ambulatory Visit (INDEPENDENT_AMBULATORY_CARE_PROVIDER_SITE_OTHER): Payer: Medicare Other

## 2017-08-12 DIAGNOSIS — E7800 Pure hypercholesterolemia, unspecified: Secondary | ICD-10-CM

## 2017-08-12 DIAGNOSIS — I10 Essential (primary) hypertension: Secondary | ICD-10-CM

## 2017-08-12 DIAGNOSIS — F419 Anxiety disorder, unspecified: Secondary | ICD-10-CM | POA: Diagnosis not present

## 2017-08-12 DIAGNOSIS — R7303 Prediabetes: Secondary | ICD-10-CM

## 2017-08-12 LAB — COMPREHENSIVE METABOLIC PANEL
ALBUMIN: 3.8 g/dL (ref 3.5–5.2)
ALK PHOS: 71 U/L (ref 39–117)
ALT: 31 U/L (ref 0–35)
AST: 18 U/L (ref 0–37)
BUN: 14 mg/dL (ref 6–23)
CALCIUM: 8.7 mg/dL (ref 8.4–10.5)
CO2: 29 mEq/L (ref 19–32)
Chloride: 104 mEq/L (ref 96–112)
Creatinine, Ser: 0.76 mg/dL (ref 0.40–1.20)
GFR: 79.66 mL/min (ref >60.00)
GLUCOSE: 97 mg/dL (ref 70–99)
POTASSIUM: 4 meq/L (ref 3.5–5.1)
Sodium: 139 mEq/L (ref 135–145)
TOTAL PROTEIN: 6.7 g/dL (ref 6.0–8.3)
Total Bilirubin: 0.5 mg/dL (ref 0.2–1.2)

## 2017-08-12 LAB — LIPID PANEL
CHOLESTEROL: 170 mg/dL (ref 0–200)
HDL: 66 mg/dL (ref >39.00)
LDL CALC: 77 mg/dL (ref 0–99)
NonHDL: 104.02
Total CHOL/HDL Ratio: 3
Triglycerides: 137 mg/dL (ref 0.0–149.0)
VLDL: 27.4 mg/dL (ref 0.0–40.0)

## 2017-08-12 LAB — CBC WITH DIFFERENTIAL/PLATELET
Basophils Absolute: 0.1 10*3/uL (ref 0.0–0.1)
Basophils Relative: 1.4 % (ref 0.0–3.0)
EOS PCT: 2.6 % (ref 0.0–5.0)
Eosinophils Absolute: 0.1 10*3/uL (ref 0.0–0.7)
HCT: 41.8 % (ref 36.0–46.0)
HEMOGLOBIN: 14.3 g/dL (ref 12.0–15.0)
Lymphocytes Relative: 38.8 % (ref 12.0–46.0)
Lymphs Abs: 1.9 10*3/uL (ref 0.7–4.0)
MCHC: 34.1 g/dL (ref 30.0–36.0)
MCV: 94.3 fl (ref 78.0–100.0)
MONOS PCT: 10.6 % (ref 3.0–12.0)
Monocytes Absolute: 0.5 10*3/uL (ref 0.1–1.0)
NEUTROS PCT: 46.6 % (ref 43.0–77.0)
Neutro Abs: 2.3 10*3/uL (ref 1.4–7.7)
PLATELETS: 221 10*3/uL (ref 150.0–400.0)
RBC: 4.44 Mil/uL (ref 3.87–5.11)
RDW: 12.7 % (ref 11.5–15.5)
WBC: 5 10*3/uL (ref 4.0–10.5)

## 2017-08-12 LAB — TSH: TSH: 0.84 u[IU]/mL (ref 0.35–4.50)

## 2017-08-12 LAB — HEMOGLOBIN A1C: HEMOGLOBIN A1C: 5.6 % (ref 4.6–6.5)

## 2017-08-13 NOTE — Progress Notes (Signed)
Subjective:    Patient ID: Amanda Turner, female    DOB: Mar 11, 1946, 72 y.o.   MRN: [id number]  HPI The patient is here for follow up.  Prediabetes:  She is compliant with a low sugar/carbohydrate diet.  She is not exercising regularly.  Hypertension: She is taking her medication daily. She is compliant with a low sodium diet.  She denies chest pain, palpitations, shortness of breath. She is not exercising regularly.  She does not monitor her blood pressure at home.    Hyperlipidemia: She is taking her medication daily. She is compliant with a low fat/cholesterol diet. She is not exercising regularly.   Anxiety: She is taking her medication daily as prescribed. She denies any side effects from the medication. She feels her anxiety is somewhat controlled.  She is anxious about her chronic pain and not feeling well.    Depression: She is taking her medication daily as prescribed. She denies any side effects from the medication. She feels her depression is fairly controlled and she is happy with her current dose of medication.   GERD:  She is taking her medication daily as prescribed.  She denies any GERD symptoms and feels her GERD is well controlled.   Chronic neck pain, left frozen shoulder:  She continues to have neck pain.  She has a frozen shoulder and that may be the cause of her neck pain.  She is following with ortho.    Chronic wrist pain:  She had surgery on her left wrist on 07/22/17.  She still has pain and swelling.  She has numbness in her left thumb and it is painful.  She sees the surgeon on Monday.    Medications and allergies reviewed with patient and updated if appropriate.  Patient Active Problem List   Diagnosis Date Noted  . 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 Long term (current) use of anticoagulants 01/14/2017  . Chronic jaw pain 10/27/2016  . Prediabetes 08/06/2016  . Chest pain 05/07/2016  . Subluxation of extensor carpi ulnaris tendon, left,  initial encounter 03/24/2016  . Left lumbar radiculopathy 02/12/2016  . Post-concussion headache 01/25/2016  . Achilles tendinosis 01/17/2016  . Pain in left wrist 01/17/2016  . Pain of left heel 12/26/2015  . Cervical radiculopathy at C5 12/13/2015  . Low back pain 12/13/2015  . Leg cramps 12/13/2015  . Anxiety 10/23/2015  . Gait instability 10/23/2015  . Edema 10/23/2015  . Encounter for therapeutic drug monitoring 10/30/2014  . Carotid stenosis 09/06/2012  . Metabolic syndrome 80/99/8338  . Lupus anticoagulant with hypercoagulable state ([place]) 06/23/2011  . CVA (cerebrovascular accident) ([place]) 03/06/2011  . Hematuria 02/11/2010  . 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 Hyperlipidemia 12/04/2006  . Depression 12/04/2006  . Essential hypertension 12/04/2006    Current Outpatient Medications on File Prior to Visit  Medication Sig Dispense Refill  . Acetaminophen-Codeine (TYLENOL/CODEINE #3) 300-30 MG tablet Take 1 tablet by mouth every 8 (eight) hours as needed for pain. 90 tablet 0  . ALPRAZolam (XANAX) 0.5 MG tablet Take 0.5 tab in morning and one tab in evening 135 tablet 0  . aspirin 81 MG tablet Take 81 mg by mouth daily.      . DULoxetine (CYMBALTA) 30 MG capsule Take 1 capsule (30 mg total) by mouth 2 (two) times daily. 180 capsule 1  . famotidine (PEPCID) 20 MG tablet TAKE 1 TABLET BY MOUTH TWO  TIMES DAILY 180 tablet 3  . furosemide (LASIX) 40 MG tablet TAKE  1 TABLET BY MOUTH  DAILY 90 tablet 1  . lisinopril (PRINIVIL,ZESTRIL) 5 MG tablet TAKE 1 TABLET BY MOUTH  DAILY 90 tablet 3  . potassium chloride SA (K-DUR,KLOR-CON) 20 MEQ tablet TAKE 1 TABLET BY MOUTH TWO  TIMES DAILY 180 tablet 3  . simvastatin (ZOCOR) 20 MG tablet TAKE 1 TABLET BY MOUTH AT  BEDTIME 90 tablet 3  . tiZANidine (ZANAFLEX) 4 MG tablet Take 1 tablet (4 mg total) by mouth at bedtime. 30 tablet 5  . traMADol (ULTRAM) 50 MG tablet Take 50 mg by mouth every 6 (six) hours as needed.    . warfarin  (COUMADIN) 5 MG tablet Take 1 tablet (5 mg total) by mouth daily. 90 tablet 1   No current facility-administered medications on file prior to visit.     Past Medical History:  Diagnosis Date  . Anxiety   . 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 Depression   . Elevated LFT's   . HA (headache)   . Hyperlipemia   . Hypertension   . Intracranial hemorrhage, spontaneous intraparenchymal, associated with coagulopathy, remote, resolved 06/23/2011  . Lupus anticoagulant disorder ([place])   . Lupus anticoagulant with hypercoagulable state ([place]) 06/23/2011  . Lupus erythematosus 06/23/2011  . Stroke [facility])     Past Surgical History:  Procedure Laterality Date  . ABDOMINAL HYSTERECTOMY    . TRANSPHENOIDAL PITUITARY RESECTION      Social History   Socioeconomic History  . Marital status: Married    Spouse name: Not on file  . Number of children: 2  . Years of education: Not on file  . Highest education level: Not on file  Occupational History    Employer: UNEMPLOYED  Social Needs  . Financial resource strain: Not on file  . Food insecurity:    Worry: Not on file    Inability: Not on file  . Transportation needs:    Medical: Not on file    Non-medical: Not on file  Tobacco Use  . Smoking status: Never Smoker  . Smokeless tobacco: Never Used  Substance and Sexual Activity  . Alcohol use: No    Alcohol/week: 0.0 oz  . Drug use: No  . Sexual activity: Not on file  Lifestyle  . Physical activity:    Days per week: Not on file    Minutes per session: Not on file  . Stress: Not on file  Relationships  . Social connections:    Talks on phone: Not on file    Gets together: Not on file    Attends religious service: Not on file    Active member of club or organization: Not on file    Attends meetings of clubs or organizations: Not on file    Relationship status: Not on file  Other Topics Concern  . Not on file  Social History Narrative    2 cups of coffee daily   Retired    Family History  Problem Relation Age of Onset  . Diabetes Brother   . Diabetes Sister   . Hypertension Mother   . Dementia Mother   . Bladder Cancer Brother   . Colon cancer Neg Hx     Review of Systems  Constitutional: Negative for chills and fever.  Respiratory: Negative for cough, shortness of breath and wheezing.   Cardiovascular: Positive for leg swelling. Negative for chest pain and palpitations.  Gastrointestinal: Negative for abdominal pain.  Musculoskeletal: Positive for arthralgias.  Neurological: Positive  for dizziness (mild, occ) and headaches.  Psychiatric/Behavioral: Positive for dysphoric mood. The patient is nervous/anxious.        Objective:   Vitals:   08/14/17 1436  BP: 136/74  Pulse: (!) 58  Resp: 16  Temp: 97.8 F (36.6 C)  SpO2: 93%   BP Readings from Last 3 Encounters:  08/14/17 136/74  07/22/17 118/67  05/08/17 122/78   Wt Readings from Last 3 Encounters:  08/14/17 195 lb (88.5 kg)  07/22/17 190 lb (86.2 kg)  05/08/17 191 lb 1.3 oz (86.7 kg)   Body mass index is 33.47 kg/m.   Physical Exam    Constitutional: Appears well-developed and well-nourished. No distress.  HENT:  Head: Normocephalic and atraumatic.  Neck: Neck supple. No tracheal deviation present. No thyromegaly present.  No cervical lymphadenopathy Cardiovascular: Normal rate, regular rhythm and normal heart sounds.    No carotid bruit .  1+ b/l LE edema edema Pulmonary/Chest: Effort normal and breath sounds normal. No respiratory distress. No has no wheezes. No rales.  Skin: Skin is warm and dry. Not diaphoretic.  Psychiatric: Normal mood and affect. Behavior is normal.      Assessment & Plan:    See Problem List for Assessment and Plan of chronic medical problems.

## 2017-08-14 ENCOUNTER — Ambulatory Visit (INDEPENDENT_AMBULATORY_CARE_PROVIDER_SITE_OTHER): Payer: Medicare Other | Admitting: General Practice

## 2017-08-14 ENCOUNTER — Encounter: Payer: Self-pay | Admitting: Internal Medicine

## 2017-08-14 ENCOUNTER — Ambulatory Visit (INDEPENDENT_AMBULATORY_CARE_PROVIDER_SITE_OTHER): Payer: Medicare Other | Admitting: Internal Medicine

## 2017-08-14 VITALS — BP 136/74 | HR 58 | Temp 97.8°F | Resp 16 | Ht 64.0 in | Wt 195.0 lb

## 2017-08-14 DIAGNOSIS — I10 Essential (primary) hypertension: Secondary | ICD-10-CM

## 2017-08-14 DIAGNOSIS — E782 Mixed hyperlipidemia: Secondary | ICD-10-CM

## 2017-08-14 DIAGNOSIS — I639 Cerebral infarction, unspecified: Secondary | ICD-10-CM | POA: Diagnosis not present

## 2017-08-14 DIAGNOSIS — R600 Localized edema: Secondary | ICD-10-CM | POA: Diagnosis not present

## 2017-08-14 DIAGNOSIS — F329 Major depressive disorder, single episode, unspecified: Secondary | ICD-10-CM | POA: Diagnosis not present

## 2017-08-14 DIAGNOSIS — R7303 Prediabetes: Secondary | ICD-10-CM | POA: Diagnosis not present

## 2017-08-14 DIAGNOSIS — Z7901 Long term (current) use of anticoagulants: Secondary | ICD-10-CM | POA: Diagnosis not present

## 2017-08-14 DIAGNOSIS — F419 Anxiety disorder, unspecified: Secondary | ICD-10-CM | POA: Diagnosis not present

## 2017-08-14 DIAGNOSIS — F32A Depression, unspecified: Secondary | ICD-10-CM

## 2017-08-14 LAB — POCT INR: INR: 3.5

## 2017-08-14 NOTE — Assessment & Plan Note (Signed)
No exercise, sitting but elevating legs Taking lasix daily No change in medication When able increase exercise and work on weight loss

## 2017-08-14 NOTE — Patient Instructions (Addendum)
Pre visit review using our clinic review tool, if applicable. No additional management support is needed unless otherwise documented below in the visit note.  Hold dosage today and then resume taking 1 tablet daily and re-check in 2 weeks.

## 2017-08-14 NOTE — Patient Instructions (Addendum)
  Test(s) ordered today for your next visit.. Your results will be released to MyChart (or called to you) after review, usually within 72hours after test completion. If any changes need to be made, you will be notified at that same time.    Medications reviewed and updated.  No changes recommended at this time.    Please followup in 6 months

## 2017-08-14 NOTE — Progress Notes (Deleted)
Office Visit Note  Patient: Amanda Turner             Date of Birth: 1946/02/26           MRN: [id number]             PCP: [name], MD Referring: [name], MD Visit Date: 08/28/2017 Occupation: @GUAROCC @    Subjective:  No chief complaint on file.   History of Present Illness: Amanda Turner is a 72 y.o. female ***   Activities of Daily Living:  Patient reports morning stiffness for *** {minute/hour:19697}.   Patient {ACTIONS;DENIES/REPORTS:21021675::"Denies"} nocturnal pain.  Difficulty dressing/grooming: {ACTIONS;DENIES/REPORTS:21021675::"Denies"} Difficulty climbing stairs: {ACTIONS;DENIES/REPORTS:21021675::"Denies"} Difficulty getting out of chair: {ACTIONS;DENIES/REPORTS:21021675::"Denies"} Difficulty using hands for taps, buttons, cutlery, and/or writing: {ACTIONS;DENIES/REPORTS:21021675::"Denies"}   No Rheumatology ROS completed.   PMFS History:  Patient Active Problem List   Diagnosis Date Noted  . 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 Long term (current) use of anticoagulants 01/14/2017  . Chronic jaw pain 10/27/2016  . Prediabetes 08/06/2016  . Chest pain 05/07/2016  . Subluxation of extensor carpi ulnaris tendon, left, initial encounter 03/24/2016  . Left lumbar radiculopathy 02/12/2016  . Post-concussion headache 01/25/2016  . Achilles tendinosis 01/17/2016  . Pain in left wrist 01/17/2016  . Pain of left heel 12/26/2015  . Cervical radiculopathy at C5 12/13/2015  . Low back pain 12/13/2015  . Leg cramps 12/13/2015  . Anxiety 10/23/2015  . Gait instability 10/23/2015  . Edema 10/23/2015  . Encounter for therapeutic drug monitoring 10/30/2014  . Carotid stenosis 09/06/2012  . Metabolic syndrome 22/29/7989  . Lupus anticoagulant with hypercoagulable state ([place]) 06/23/2011  . CVA (cerebrovascular accident) ([place]) 03/06/2011  . Hematuria 02/11/2010  . 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 Hyperlipidemia 12/04/2006  . Depression  12/04/2006  . Essential hypertension 12/04/2006    Past Medical History:  Diagnosis Date  . Anxiety   . 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 Depression   . Elevated LFT's   . HA (headache)   . Hyperlipemia   . Hypertension   . Intracranial hemorrhage, spontaneous intraparenchymal, associated with coagulopathy, remote, resolved 06/23/2011  . Lupus anticoagulant disorder ([place])   . Lupus anticoagulant with hypercoagulable state ([place]) 06/23/2011  . Lupus erythematosus 06/23/2011  . Stroke [facility])     Family History  Problem Relation Age of Onset  . Diabetes Brother   . Diabetes Sister   . Hypertension Mother   . Dementia Mother   . Bladder Cancer Brother   . Colon cancer Neg Hx    Past Surgical History:  Procedure Laterality Date  . ABDOMINAL HYSTERECTOMY    . TRANSPHENOIDAL PITUITARY RESECTION     Social History   Social History Narrative   2 cups of coffee daily   Retired     Objective: Vital Signs: There were no vitals taken for this visit.   Physical Exam   Musculoskeletal Exam: ***  CDAI Exam: No CDAI exam completed.    Investigation: No additional findings.  CBC Latest Ref Rng & Units 08/12/2017 08/04/2016 10/23/2015  WBC 4.0 - 10.5 K/uL 5.0 6.1 5.9  Hemoglobin 12.0 - 15.0 g/dL 14.3 15.1(H) 15.1(H)  Hematocrit 36.0 - 46.0 % 41.8 44.3 44.7  Platelets 150.0 - 400.0 K/uL 221.0 239.0 202.0   CMP Latest Ref Rng & Units 08/12/2017 10/27/2016 08/04/2016  Glucose 70 - 99 mg/dL 97 113(H) 120(H)  BUN 6 - 23 mg/dL 14 19 16   Creatinine 0.40 -  1.20 mg/dL 0.76 0.85 0.77  Sodium 135 - 145 mEq/L 139 141 141  Potassium 3.5 - 5.1 mEq/L 4.0 3.4(L) 3.8  Chloride 96 - 112 mEq/L 104 101 105  CO2 19 - 32 mEq/L 29 30 30   Calcium 8.4 - 10.5 mg/dL 8.7 9.6 9.5  Total Protein 6.0 - 8.3 g/dL 6.7 7.5 7.1  Total Bilirubin 0.2 - 1.2 mg/dL 0.5 0.7 0.6  Alkaline Phos 39 - 117 U/L 71 82 87  AST 0 - 37 U/L 18 16 25   ALT 0 - 35 U/L 31 21 45(H)    Imaging: No results found.  Speciality Comments: No specialty comments available.    Procedures:  No procedures performed Allergies: Gabapentin; Cymbalta [duloxetine hcl]; Lisinopril; Losartan; Peanut oil; Shrimp flavor; and Sulfonamide derivatives   Assessment / Plan:     Visit Diagnoses: Myalgia  Lupus anticoagulant with hypercoagulable state ([place])  Left lumbar radiculopathy  Ulnar impaction syndrome, left  Achilles tendinosis  Anxiety and depression  Essential hypertension  History of hyperlipidemia  Prediabetes    Orders: No orders of the defined types were placed in this encounter.  No orders of the defined types were placed in this encounter.   Face-to-face time spent with patient was *** minutes. 50% of time was spent in counseling and coordination of care.  Follow-Up Instructions: No follow-ups on file.   Ofilia Neas, PA-C  Note - This record has been created using Dragon software.  Chart creation errors have been sought, but may not always  have been located. Such creation errors do not reflect on  the standard of medical care.

## 2017-08-14 NOTE — Assessment & Plan Note (Signed)
BP well controlled Current regimen effective and well tolerated Continue current medications at current doses Blood work reviewed

## 2017-08-15 NOTE — Assessment & Plan Note (Signed)
Controlled, stable She is content with her current medication Continue current dose of medication

## 2017-08-15 NOTE — Progress Notes (Signed)
Agree with management.  Josedejesus Marcum J Leonard Feigel, MD  

## 2017-08-15 NOTE — Assessment & Plan Note (Signed)
Lab Results  Component Value Date   HGBA1C 5.6 08/12/2017   Continue diabetic diet Decrease portions Work on weight loss Increase activity FU in 6 months

## 2017-08-15 NOTE — Assessment & Plan Note (Signed)
Fairly controlled Her chronic pain is causing her anxiety Continue cymbalta at current dose and xanax Has f/u with ortho for wrist and shoulder/neck

## 2017-08-15 NOTE — Assessment & Plan Note (Signed)
Lipids well controlled  Continue statin 

## 2017-08-17 ENCOUNTER — Ambulatory Visit: Payer: Self-pay

## 2017-08-17 DIAGNOSIS — M24132 Other articular cartilage disorders, left wrist: Secondary | ICD-10-CM | POA: Diagnosis not present

## 2017-08-17 DIAGNOSIS — M79642 Pain in left hand: Secondary | ICD-10-CM | POA: Diagnosis not present

## 2017-08-17 DIAGNOSIS — M25632 Stiffness of left wrist, not elsewhere classified: Secondary | ICD-10-CM | POA: Diagnosis not present

## 2017-08-18 ENCOUNTER — Encounter: Payer: Self-pay | Admitting: Internal Medicine

## 2017-08-18 ENCOUNTER — Telehealth: Payer: Self-pay | Admitting: Physical Therapy

## 2017-08-18 ENCOUNTER — Encounter: Payer: Self-pay | Admitting: Neurology

## 2017-08-18 NOTE — Telephone Encounter (Signed)
Returned phone call after patient left message "about a cast".  Left message.

## 2017-08-19 ENCOUNTER — Ambulatory Visit: Payer: Self-pay | Admitting: Internal Medicine

## 2017-08-22 ENCOUNTER — Encounter: Payer: Self-pay | Admitting: Internal Medicine

## 2017-08-26 ENCOUNTER — Ambulatory Visit: Payer: Medicare Other | Admitting: General Practice

## 2017-08-26 NOTE — Progress Notes (Signed)
I have reviewed and agree with this plan  

## 2017-08-27 ENCOUNTER — Encounter: Payer: Self-pay | Admitting: Internal Medicine

## 2017-08-28 ENCOUNTER — Ambulatory Visit: Payer: Medicare Other | Admitting: Rheumatology

## 2017-08-28 ENCOUNTER — Encounter: Payer: Self-pay | Admitting: Internal Medicine

## 2017-08-28 ENCOUNTER — Other Ambulatory Visit: Payer: Self-pay | Admitting: General Practice

## 2017-08-28 DIAGNOSIS — M7989 Other specified soft tissue disorders: Secondary | ICD-10-CM

## 2017-08-28 MED ORDER — WARFARIN SODIUM 5 MG PO TABS: 5.0000 mg | ORAL_TABLET | Freq: Every day | ORAL | 1 refills | Status: DC

## 2017-08-28 NOTE — Telephone Encounter (Signed)
Forwarding msg to [place] w/coumadin clinic.Marland KitchenJohny Turner

## 2017-08-30 DIAGNOSIS — M7989 Other specified soft tissue disorders: Secondary | ICD-10-CM | POA: Insufficient documentation

## 2017-08-30 MED ORDER — FUROSEMIDE 40 MG PO TABS: ORAL_TABLET | ORAL | 1 refills | Status: DC

## 2017-08-31 ENCOUNTER — Encounter: Payer: Self-pay | Admitting: Neurology

## 2017-09-03 ENCOUNTER — Encounter: Payer: Self-pay | Admitting: Internal Medicine

## 2017-09-06 ENCOUNTER — Encounter (INDEPENDENT_AMBULATORY_CARE_PROVIDER_SITE_OTHER): Payer: Self-pay | Admitting: Orthopaedic Surgery

## 2017-09-07 ENCOUNTER — Encounter: Payer: Self-pay | Admitting: Neurology

## 2017-09-07 ENCOUNTER — Encounter: Payer: Self-pay | Admitting: Internal Medicine

## 2017-09-08 DIAGNOSIS — G90512 Complex regional pain syndrome I of left upper limb: Secondary | ICD-10-CM | POA: Diagnosis not present

## 2017-09-08 DIAGNOSIS — M24132 Other articular cartilage disorders, left wrist: Secondary | ICD-10-CM | POA: Diagnosis not present

## 2017-09-08 DIAGNOSIS — M79642 Pain in left hand: Secondary | ICD-10-CM | POA: Diagnosis not present

## 2017-09-08 DIAGNOSIS — M792 Neuralgia and neuritis, unspecified: Secondary | ICD-10-CM | POA: Diagnosis not present

## 2017-09-09 ENCOUNTER — Encounter: Payer: Self-pay | Admitting: Neurology

## 2017-09-09 ENCOUNTER — Encounter (INDEPENDENT_AMBULATORY_CARE_PROVIDER_SITE_OTHER): Payer: Self-pay | Admitting: Orthopaedic Surgery

## 2017-09-10 ENCOUNTER — Ambulatory Visit (INDEPENDENT_AMBULATORY_CARE_PROVIDER_SITE_OTHER): Payer: Medicare Other | Admitting: Internal Medicine

## 2017-09-10 ENCOUNTER — Ambulatory Visit: Payer: Self-pay

## 2017-09-10 ENCOUNTER — Encounter: Payer: Self-pay | Admitting: Internal Medicine

## 2017-09-10 ENCOUNTER — Other Ambulatory Visit (INDEPENDENT_AMBULATORY_CARE_PROVIDER_SITE_OTHER): Payer: Medicare Other

## 2017-09-10 VITALS — BP 124/64 | HR 66 | Temp 98.2°F | Ht 64.0 in | Wt 202.0 lb

## 2017-09-10 DIAGNOSIS — R600 Localized edema: Secondary | ICD-10-CM | POA: Diagnosis not present

## 2017-09-10 DIAGNOSIS — I10 Essential (primary) hypertension: Secondary | ICD-10-CM | POA: Diagnosis not present

## 2017-09-10 DIAGNOSIS — R609 Edema, unspecified: Secondary | ICD-10-CM | POA: Diagnosis not present

## 2017-09-10 DIAGNOSIS — E782 Mixed hyperlipidemia: Secondary | ICD-10-CM

## 2017-09-10 DIAGNOSIS — G8929 Other chronic pain: Secondary | ICD-10-CM | POA: Insufficient documentation

## 2017-09-10 DIAGNOSIS — R7303 Prediabetes: Secondary | ICD-10-CM

## 2017-09-10 DIAGNOSIS — M25532 Pain in left wrist: Secondary | ICD-10-CM

## 2017-09-10 DIAGNOSIS — M7989 Other specified soft tissue disorders: Secondary | ICD-10-CM | POA: Diagnosis not present

## 2017-09-10 LAB — CBC WITH DIFFERENTIAL/PLATELET
BASOS PCT: 1.8 % (ref 0.0–3.0)
Basophils Absolute: 0.1 10*3/uL (ref 0.0–0.1)
EOS PCT: 2.9 % (ref 0.0–5.0)
Eosinophils Absolute: 0.1 10*3/uL (ref 0.0–0.7)
HEMATOCRIT: 41.5 % (ref 36.0–46.0)
HEMOGLOBIN: 14.3 g/dL (ref 12.0–15.0)
LYMPHS PCT: 33.3 % (ref 12.0–46.0)
Lymphs Abs: 1.6 10*3/uL (ref 0.7–4.0)
MCHC: 34.3 g/dL (ref 30.0–36.0)
MCV: 93.6 fl (ref 78.0–100.0)
Monocytes Absolute: 0.6 10*3/uL (ref 0.1–1.0)
Monocytes Relative: 12.4 % — ABNORMAL HIGH (ref 3.0–12.0)
Neutro Abs: 2.4 10*3/uL (ref 1.4–7.7)
Neutrophils Relative %: 49.6 % (ref 43.0–77.0)
Platelets: 208 10*3/uL (ref 150.0–400.0)
RBC: 4.44 Mil/uL (ref 3.87–5.11)
RDW: 12.7 % (ref 11.5–15.5)
WBC: 4.9 10*3/uL (ref 4.0–10.5)

## 2017-09-10 LAB — BASIC METABOLIC PANEL
BUN: 12 mg/dL (ref 6–23)
CHLORIDE: 101 meq/L (ref 96–112)
CO2: 31 mEq/L (ref 19–32)
Calcium: 8.9 mg/dL (ref 8.4–10.5)
Creatinine, Ser: 0.74 mg/dL (ref 0.40–1.20)
GFR: 82.13 mL/min (ref >60.00)
Glucose, Bld: 102 mg/dL — ABNORMAL HIGH (ref 70–99)
POTASSIUM: 3.6 meq/L (ref 3.5–5.1)
SODIUM: 138 meq/L (ref 135–145)

## 2017-09-10 LAB — LIPID PANEL
CHOL/HDL RATIO: 3
Cholesterol: 194 mg/dL (ref 0–200)
HDL: 63.3 mg/dL (ref >39.00)
LDL CALC: 94 mg/dL (ref 0–99)
NonHDL: 131.19
TRIGLYCERIDES: 185 mg/dL — AB (ref 0.0–149.0)
VLDL: 37 mg/dL (ref 0.0–40.0)

## 2017-09-10 LAB — HEMOGLOBIN A1C: HEMOGLOBIN A1C: 5.5 % (ref 4.6–6.5)

## 2017-09-10 MED ORDER — FUROSEMIDE 40 MG PO TABS: ORAL_TABLET | ORAL | 1 refills | Status: DC

## 2017-09-10 NOTE — Assessment & Plan Note (Signed)
stable overall by history and exam, recent data reviewed with pt, and pt to continue medical treatment as before,  to f/u any worsening symptoms or concerns BP Readings from Last 3 Encounters:  09/10/17 124/64  08/14/17 136/74  07/22/17 118/67

## 2017-09-10 NOTE — Progress Notes (Signed)
Subjective:    Patient ID: Amanda Turner, female    DOB: Oct 03, 1945, 72 y.o.   MRN: [id number]  HPI  Here to f/u, PCP is Dr Quay Burow, with c/o worsening LE edema.  Pt has gained 12 lbs with intentionally drinking more fluids in a bid supported by husband to avoid constipation, dehydration.  2010 echo with normal EF.  Husband very concerned that maybe her current meds are a factor in fluid retention, especially since she has been on higher dose gabapentin  He is now distraught today over the turn of events, complicated by his concern over recent events regarding chronic pain (complex regional pain disorder) she is "left with" after wrist surgury, with pt and husband asking for second opinion, as they are concerned that she needs an MRI but was referred to pain management, with an interim plan to wean off the gabapentin over 2 wks then start topamax.   Wt Readings from Last 3 Encounters:  09/10/17 202 lb (91.6 kg)  08/14/17 195 lb (88.5 kg)  07/22/17 190 lb (86.2 kg)  Pt denies chest pain, increased sob or doe, wheezing, orthopnea, PND, palpitations, dizziness or syncope.   Pt denies fever, wt loss, night sweats, loss of appetite, or other constitutional symptoms.  Denies worsening depressive symptoms, suicidal ideation, or panic.  They are not seeing narcotic management today Past Medical History:  Diagnosis Date  . Anxiety   . 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 Depression   . Elevated LFT's   . HA (headache)   . Hyperlipemia   . Hypertension   . Intracranial hemorrhage, spontaneous intraparenchymal, associated with coagulopathy, remote, resolved 06/23/2011  . Lupus anticoagulant disorder ([place])   . Lupus anticoagulant with hypercoagulable state ([place]) 06/23/2011  . Lupus erythematosus 06/23/2011  . Stroke [facility])    Past Surgical History:  Procedure Laterality Date  . ABDOMINAL HYSTERECTOMY    . TRANSPHENOIDAL PITUITARY RESECTION      reports that  she has never smoked. She has never used smokeless tobacco. She reports that she does not drink alcohol or use drugs. family history includes Bladder Cancer in her brother; Dementia in her mother; Diabetes in her brother and sister; Hypertension in her mother. Allergies  Allergen Reactions  . Gabapentin Nausea Only    Dizziness,   . Cymbalta [Duloxetine Hcl] Itching and Nausea Only    headaches  . Lisinopril Cough  . Losartan Swelling  . Peanut Oil     REACTION: hives  . Shrimp Flavor     hives  . Sulfonamide Derivatives     REACTION: shortness of breath   Current Outpatient Medications on File Prior to Visit  Medication Sig Dispense Refill  . ALPRAZolam (XANAX) 0.5 MG tablet Take 0.5 tab in morning and one tab in evening 135 tablet 0  . aspirin 81 MG tablet Take 81 mg by mouth daily.      . DULoxetine (CYMBALTA) 30 MG capsule Take 1 capsule (30 mg total) by mouth 2 (two) times daily. 180 capsule 1  . famotidine (PEPCID) 20 MG tablet TAKE 1 TABLET BY MOUTH TWO  TIMES DAILY 180 tablet 3  . gabapentin (NEURONTIN) 300 MG capsule Take 600 mg by mouth 3 (three) times daily.  0  . lisinopril (PRINIVIL,ZESTRIL) 5 MG tablet TAKE 1 TABLET BY MOUTH  DAILY 90 tablet 3  . potassium chloride SA (K-DUR,KLOR-CON) 20 MEQ tablet TAKE 1 TABLET BY MOUTH TWO  TIMES DAILY 180 tablet 3  .  simvastatin (ZOCOR) 20 MG tablet TAKE 1 TABLET BY MOUTH AT  BEDTIME 90 tablet 3  . traMADol (ULTRAM) 50 MG tablet Take 50 mg by mouth every 6 (six) hours as needed.    . warfarin (COUMADIN) 5 MG tablet Take 1 tablet (5 mg total) by mouth daily. 90 tablet 1  . topiramate (TOPAMAX) 50 MG tablet Take by mouth.     No current facility-administered medications on file prior to visit.    Review of Systems  Constitutional: Negative for other unusual diaphoresis or sweats HENT: Negative for ear discharge or swelling Eyes: Negative for other worsening visual disturbances Respiratory: Negative for stridor or other swelling    Gastrointestinal: Negative for worsening distension or other blood Genitourinary: Negative for retention or other urinary change Musculoskeletal: Negative for other MSK pain or swelling Skin: Negative for color change or other new lesions Neurological: Negative for worsening tremors and other numbness  Psychiatric/Behavioral: Negative for worsening agitation or other fatigue All other system neg per pt    Objective:   Physical Exam BP 124/64 (BP Location: Left Arm, Patient Position: Sitting, Cuff Size: Normal)   Pulse 66   Temp 98.2 F (36.8 C) (Oral)   Ht 5\' 4"  (1.626 m)   Wt 202 lb (91.6 kg)   SpO2 96%   BMI 34.67 kg/m  VS noted,  Constitutional: Pt appears in NAD HENT: Head: NCAT.  Right Ear: External ear normal.  Left Ear: External ear normal.  Eyes: . Pupils are equal, round, and reactive to light. Conjunctivae and EOM are normal Nose: without d/c or deformity Neck: Neck supple. Gross normal ROM Cardiovascular: Normal rate and regular rhythm.   Pulmonary/Chest: Effort normal and breath sounds without rales or wheezing.  Abd:  Soft, NT, ND, + BS, no organomegaly with pt examined seated in wheelchair Neurological: Pt is alert. At baseline orientation, motor grossly intact Skin: Skin is warm. No rashes, other new lesions, 2+ LE edema to knees bilat with some tenseness to calves Psychiatric: Pt behavior is normal without agitation  No other exam findings    Assessment & Plan:

## 2017-09-10 NOTE — Assessment & Plan Note (Addendum)
ecg reviewed, d/w pt and husband that forcing po fluids at this point is counterproductive towards lessening her leg swelling; I doubt this is med related issue, agree with current med management except ok to increase the lasix to 40 bid (probably temporarily), check daily wts at home, call for wt increase over 5 lbs, cont K replacement, for repeat echo to r/o diastolic dysfxn, close f/u with PCP, and suspect with less forced fluids she will be able to reduce lasix to 40 qd or even prn  Note:  Total time for pt hx, exam, review of record with pt in the room, determination of diagnoses and plan for further eval and tx is > 40 min, with over 50% spent in coordination and counseling of patient including the differential dx, tx, further evaluation and other management of leg swelling, left wrist pain and HTN

## 2017-09-10 NOTE — Patient Instructions (Addendum)
Ok to increase the lasix to 40 mg twice per day, but you can reduce to 40 mg in the AM when the swelling and weight have decreased  Please check your weight at home every day first thing in the AM after the bathroom; please call for further weight gain of 5 lbs  Please do not force fluids; in other words, if you are thirsty you should take fluids, but no fluids if you are not thirsty  Your EKG was OK today  Please continue all other medications as before, except for the weaning off of the gabapentin and change to topamax as per Dr Burney Gauze  Please have the pharmacy call with any other refills you may need.  Please continue your efforts at being more active, low cholesterol diet, and weight control.  Please keep your appointments with your specialists as you may have planned  You will be contacted regarding the referral for: Echocardiogram, and Dr Amedeo Plenty for a second opinion  Please go to the LAB in the Basement (turn left off the elevator) for the tests to be done today  You will be contacted by phone if any changes need to be made immediately.  Otherwise, you will receive a letter about your results with an explanation, but please check with MyChart first.  Please remember to sign up for MyChart if you have not done so, as this will be important to you in the future with finding out test results, communicating by private email, and scheduling acute appointments online when needed.  Please return in 2 weeks, or sooner if needed, to Dr Quay Burow

## 2017-09-10 NOTE — Assessment & Plan Note (Signed)
Agree with pain management for now, will also refer Hand Surg[facility] for second opinion per pt request

## 2017-09-10 NOTE — Telephone Encounter (Signed)
Patient's husband called in with c/o "swelling all over." He says "she had wrist surgery and had problems with pain and swelling. The doctor put her on Gabapentin about 4 weeks ago and 1 week after, the swelling started. So she's been getting more and more swollen for the past 3 weeks. I just want someone to tell me what is going on and what we need to do from here. She can barely go up steps with the pain she has from the swelling in her legs." I asked about her breathing, she says "I am a little SOB, especially trying to get upstairs or walking around, because it hurts so bad." Her husband says "can we see any provider today?" According to protocol, see PCP within 4 hours, no availability with PCP, appointment scheduled for 1620 with [name], care advice given, husband verbalized understanding.  Reason for Disposition . Nursing judgment . SEVERE leg swelling (e.g., swelling extends above knee, entire leg is swollen, weeping fluid)  Answer Assessment - Initial Assessment Questions 1. ONSET: "When did the swelling start?" (e.g., minutes, hours, days)     3 weeks 2. LOCATION: "What part of the leg is swollen?"  "Are both legs swollen or just one leg?"     Ankles and legs 3. SEVERITY: "How bad is the swelling?" (e.g., localized; mild, moderate, severe)  - Localized - small area of swelling localized to one leg  - MILD pedal edema - swelling limited to foot and ankle, pitting edema < 1/4 inch (6 mm) deep, rest and elevation eliminate most or all swelling  - MODERATE edema - swelling of lower leg to knee, pitting edema > 1/4 inch (6 mm) deep, rest and elevation only partially reduce swelling  - SEVERE edema - swelling extends above knee, facial or hand swelling present      Severe 4. REDNESS: "Does the swelling look red or infected?"     Skin pinkish where she's swollen, all over from head to feet 5. PAIN: "Is the swelling painful to touch?" If so, ask: "How painful is it?"   (Scale 1-10; mild,  moderate or severe)     Chronic pain 6. FEVER: "Do you have a fever?" If so, ask: "What is it, how was it measured, and when did it start?"      No 7. CAUSE: "What do you think is causing the leg swelling?"     Gabapentin 8. MEDICAL HISTORY: "Do you have a history of heart failure, kidney disease, liver failure, or cancer?"     No 9. RECURRENT SYMPTOM: "Have you had leg swelling before?" If so, ask: "When was the last time?" "What happened that time?"     Yes, not this bad 10. OTHER SYMPTOMS: "Do you have any other symptoms?" (e.g., chest pain, difficulty breathing)      Some difficulty breathing 11. PREGNANCY: "Is there any chance you are pregnant?" "When was your last menstrual period?"      No  Protocols used: NO GUIDELINE OR REFERENCE AVAILABLE-A-AH, LEG SWELLING AND EDEMA-A-AH

## 2017-09-11 ENCOUNTER — Encounter: Payer: Self-pay | Admitting: Internal Medicine

## 2017-09-11 LAB — BRAIN NATRIURETIC PEPTIDE: Pro B Natriuretic peptide (BNP): 31 pg/mL (ref 0.0–100.0)

## 2017-09-11 LAB — HEPATIC FUNCTION PANEL
ALBUMIN: 3.8 g/dL (ref 3.5–5.2)
ALT: 44 U/L — ABNORMAL HIGH (ref 0–35)
AST: 29 U/L (ref 0–37)
Alkaline Phosphatase: 85 U/L (ref 39–117)
Bilirubin, Direct: 0.1 mg/dL (ref 0.0–0.3)
TOTAL PROTEIN: 6.6 g/dL (ref 6.0–8.3)
Total Bilirubin: 0.4 mg/dL (ref 0.2–1.2)

## 2017-09-14 ENCOUNTER — Other Ambulatory Visit: Payer: Self-pay | Admitting: Anesthesiology

## 2017-09-14 DIAGNOSIS — G90512 Complex regional pain syndrome I of left upper limb: Secondary | ICD-10-CM

## 2017-09-15 ENCOUNTER — Ambulatory Visit (HOSPITAL_COMMUNITY): Payer: Medicare Other | Attending: Internal Medicine

## 2017-09-15 ENCOUNTER — Other Ambulatory Visit: Payer: Self-pay

## 2017-09-15 ENCOUNTER — Ambulatory Visit
Admission: RE | Admit: 2017-09-15 | Discharge: 2017-09-15 | Disposition: A | Discharge status: Home / Self Care | Payer: Medicare Other | Source: Ambulatory Visit | Attending: Anesthesiology | Admitting: Anesthesiology

## 2017-09-15 DIAGNOSIS — I10 Essential (primary) hypertension: Secondary | ICD-10-CM | POA: Insufficient documentation

## 2017-09-15 DIAGNOSIS — R609 Edema, unspecified: Secondary | ICD-10-CM | POA: Insufficient documentation

## 2017-09-15 DIAGNOSIS — G90512 Complex regional pain syndrome I of left upper limb: Secondary | ICD-10-CM

## 2017-09-15 DIAGNOSIS — Z8673 Personal history of transient ischemic attack (TIA), and cerebral infarction without residual deficits: Secondary | ICD-10-CM | POA: Diagnosis not present

## 2017-09-15 DIAGNOSIS — E785 Hyperlipidemia, unspecified: Secondary | ICD-10-CM | POA: Diagnosis not present

## 2017-09-15 DIAGNOSIS — R600 Localized edema: Secondary | ICD-10-CM | POA: Diagnosis not present

## 2017-09-20 ENCOUNTER — Other Ambulatory Visit: Payer: Self-pay

## 2017-09-24 NOTE — Progress Notes (Deleted)
Office Visit Note  Patient: Amanda Turner             Date of Birth: [birth year]           MRN: [id number]             PCP: [name], MD Referring: [name], MD Visit Date: 10/06/2017 Occupation: @GUAROCC @    Subjective:  No chief complaint on file.   History of Present Illness: Justyn Klang is a 72 y.o. female ***   Activities of Daily Living:  Patient reports morning stiffness for *** {minute/hour:19697}.   Patient {ACTIONS;DENIES/REPORTS:21021675::"Denies"} nocturnal pain.  Difficulty dressing/grooming: {ACTIONS;DENIES/REPORTS:21021675::"Denies"} Difficulty climbing stairs: {ACTIONS;DENIES/REPORTS:21021675::"Denies"} Difficulty getting out of chair: {ACTIONS;DENIES/REPORTS:21021675::"Denies"} Difficulty using hands for taps, buttons, cutlery, and/or writing: {ACTIONS;DENIES/REPORTS:21021675::"Denies"}   No Rheumatology ROS completed.   PMFS History:  Patient Active Problem List   Diagnosis Date Noted  . Left wrist pain 09/10/2017  . Leg swelling 08/30/2017  . 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 Long term (current) use of anticoagulants 01/14/2017  . Chronic jaw pain 10/27/2016  . Prediabetes 08/06/2016  . Chest pain 05/07/2016  . Subluxation of extensor carpi ulnaris tendon, left, initial encounter 03/24/2016  . Left lumbar radiculopathy 02/12/2016  . Post-concussion headache 01/25/2016  . Achilles tendinosis 01/17/2016  . Pain in left wrist 01/17/2016  . Pain of left heel 12/26/2015  . Cervical radiculopathy at C5 12/13/2015  . Low back pain 12/13/2015  . Leg cramps 12/13/2015  . Anxiety 10/23/2015  . Gait instability 10/23/2015  . Edema 10/23/2015  . Encounter for therapeutic drug monitoring 10/30/2014  . Carotid stenosis 09/06/2012  . Metabolic syndrome 29/93/7169  . Lupus anticoagulant with hypercoagulable state ([place]) 06/23/2011  . CVA (cerebrovascular accident) ([place]) 03/06/2011  . Hematuria 02/11/2010  . 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 Hyperlipidemia 12/04/2006  . Depression 12/04/2006  . Essential hypertension 12/04/2006    Past Medical History:  Diagnosis Date  . Anxiety   . 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 Depression   . Elevated LFT's   . HA (headache)   . Hyperlipemia   . Hypertension   . Intracranial hemorrhage, spontaneous intraparenchymal, associated with coagulopathy, remote, resolved 06/23/2011  . Lupus anticoagulant disorder ([place])   . Lupus anticoagulant with hypercoagulable state ([place]) 06/23/2011  . Lupus erythematosus 06/23/2011  . Stroke [facility])     Family History  Problem Relation Age of Onset  . Diabetes Brother   . Diabetes Sister   . Hypertension Mother   . Dementia Mother   . Bladder Cancer Brother   . Colon cancer Neg Hx    Past Surgical History:  Procedure Laterality Date  . ABDOMINAL HYSTERECTOMY    . TRANSPHENOIDAL PITUITARY RESECTION     Social History   Social History Narrative   2 cups of coffee daily   Retired     Objective: Vital Signs: There were no vitals taken for this visit.   Physical Exam   Musculoskeletal Exam: ***  CDAI Exam: No CDAI exam completed.    Investigation: Findings:  08/12/17 TSH 0.84  MRI 09/15/17: Status post resection of the distal ulna since the prior examination. Marrow edema in the distal 2.5 cm of the diaphysis of the ulna may be postoperative but could be secondary to osteomyelitis. Recommend correlation with inflammatory markers No focal fluid collection is identified but there is extensive subcutaneous edema about the hand and soft tissue edema about the resection site of  the ulna which may be due to cellulitis or dependent change. Some improvement in edema in the lunate and triquetrum since the prior MRI. Tear of the disc of the triangular fibrocartilage seen on the prior examination is difficult to visualize on this study and may have been  repaired. No change in degeneration of the scapholunate ligament without tear.     Imaging: Mr Wrist Left Wo Contrast  Result Date: 09/15/2017 CLINICAL DATA:  Left wrist pain, tightness, numbness and swelling. History of left wrist surgery for ulnocarpal abutment on 07/22/2017. EXAM: MR OF THE LEFT WRIST WITHOUT CONTRAST TECHNIQUE: Multiplanar, multisequence MR imaging of the left wrist was performed. No intravenous contrast was administered. COMPARISON:  MRI left wrist 02/12/2017. FINDINGS: Ligaments: Intact. Degenerative signal in the scapholunate ligament noted. The lunotriquetral ligament appears intact. Triangular fibrocartilage: The disc of the triangular fibrocartilage is markedly thinned. Previously seen tear is not well seen on this examination and may have been repaired. Tendons: Intact. Carpal tunnel/median nerve: Normal. Guyon's canal: Normal. Joint/cartilage: First CMC degenerative disease is again seen. There is also some cartilage thinning at the articulation of the radius and lunate. Bones/carpal alignment: Since the prior examination, the patient has undergone resection of the distal ulna. There is marrow edema in the distal 2.5 cm of the ulnar diaphysis. Soft tissue edema is seen about the surgical site. Edema in the lunate and triquetrum are again seen and appear improved, particularly in the triquetrum. Other: Extensive subcutaneous edema about the wrist is worst on the dorsum. No focal fluid collection. IMPRESSION: Status post resection of the distal ulna since the prior examination. Marrow edema in the distal 2.5 cm of the diaphysis of the ulna may be postoperative but could be secondary to osteomyelitis. Recommend correlation with inflammatory markers. No focal fluid collection is identified but there is extensive subcutaneous edema about the hand and soft tissue edema about the resection site of the ulna which may be due to cellulitis or dependent change. Some improvement in edema  in the lunate and triquetrum since the prior MRI. Tear of the disc of the triangular fibrocartilage seen on the prior examination is difficult to visualize on this study and may have been repaired. No change in degeneration of the scapholunate ligament without tear. Electronically Signed   By: Inge Rise M.D.   On: 09/15/2017 16:22    Speciality Comments: No specialty comments available.    Procedures:  No procedures performed Allergies: Gabapentin; Cymbalta [duloxetine hcl]; Lisinopril; Losartan; Peanut oil; Shrimp flavor; and Sulfonamide derivatives   Assessment / Plan:     Visit Diagnoses: Myalgia  Lupus anticoagulant with hypercoagulable state ([place])  Subluxation of left extensor carpi ulnaris tendon, subsequent encounter  Cervical radiculopathy at C5  Left lumbar radiculopathy  Metabolic syndrome  History of craniopharyngioma  Essential hypertension  Stenosis of carotid artery, unspecified laterality  History of CVA (cerebrovascular accident)    Orders: No orders of the defined types were placed in this encounter.  No orders of the defined types were placed in this encounter.   Face-to-face time spent with patient was *** minutes. 50% of time was spent in counseling and coordination of care.  Follow-Up Instructions: No follow-ups on file.   Ofilia Neas, PA-C  Note - This record has been created using Dragon software.  Chart creation errors have been sought, but may not always  have been located. Such creation errors do not reflect on  the standard of medical care.

## 2017-09-29 DIAGNOSIS — Z79899 Other long term (current) drug therapy: Secondary | ICD-10-CM | POA: Diagnosis not present

## 2017-09-29 DIAGNOSIS — M79642 Pain in left hand: Secondary | ICD-10-CM | POA: Diagnosis not present

## 2017-09-29 DIAGNOSIS — M792 Neuralgia and neuritis, unspecified: Secondary | ICD-10-CM | POA: Diagnosis not present

## 2017-09-29 DIAGNOSIS — G90512 Complex regional pain syndrome I of left upper limb: Secondary | ICD-10-CM | POA: Diagnosis not present

## 2017-09-30 ENCOUNTER — Ambulatory Visit: Payer: Medicare Other | Admitting: Rheumatology

## 2017-09-30 ENCOUNTER — Encounter: Payer: Self-pay | Admitting: Internal Medicine

## 2017-10-05 ENCOUNTER — Ambulatory Visit (INDEPENDENT_AMBULATORY_CARE_PROVIDER_SITE_OTHER): Payer: Medicare Other | Admitting: General Practice

## 2017-10-05 DIAGNOSIS — Z7901 Long term (current) use of anticoagulants: Secondary | ICD-10-CM | POA: Diagnosis not present

## 2017-10-05 LAB — POCT INR: INR: 2.7 (ref 2.0–3.0)

## 2017-10-05 NOTE — Patient Instructions (Addendum)
Pre visit review using our clinic review tool, if applicable. No additional management support is needed unless otherwise documented below in the visit note.  Continue to take 5mg daily.  Re-check in 4 weeks.    

## 2017-10-06 ENCOUNTER — Ambulatory Visit: Payer: Medicare Other | Admitting: Rheumatology

## 2017-10-07 ENCOUNTER — Other Ambulatory Visit: Payer: Self-pay | Admitting: Internal Medicine

## 2017-10-07 ENCOUNTER — Encounter: Payer: Self-pay | Admitting: Internal Medicine

## 2017-10-07 NOTE — Telephone Encounter (Signed)
[place] Controlled Substance Database checked. Last filled on 07/06/17

## 2017-10-13 DIAGNOSIS — M24132 Other articular cartilage disorders, left wrist: Secondary | ICD-10-CM | POA: Diagnosis not present

## 2017-10-19 ENCOUNTER — Encounter: Payer: Self-pay | Admitting: Internal Medicine

## 2017-10-21 DIAGNOSIS — M79642 Pain in left hand: Secondary | ICD-10-CM | POA: Diagnosis not present

## 2017-10-27 DIAGNOSIS — M79642 Pain in left hand: Secondary | ICD-10-CM | POA: Diagnosis not present

## 2017-10-30 DIAGNOSIS — M79642 Pain in left hand: Secondary | ICD-10-CM | POA: Diagnosis not present

## 2017-11-02 ENCOUNTER — Ambulatory Visit (INDEPENDENT_AMBULATORY_CARE_PROVIDER_SITE_OTHER): Payer: Medicare Other | Admitting: General Practice

## 2017-11-02 DIAGNOSIS — Z7901 Long term (current) use of anticoagulants: Secondary | ICD-10-CM

## 2017-11-02 LAB — POCT INR: INR: 2.5 (ref 2.0–3.0)

## 2017-11-02 NOTE — Patient Instructions (Signed)
Pre visit review using our clinic review tool, if applicable. No additional management support is needed unless otherwise documented below in the visit note. 

## 2017-11-03 NOTE — Progress Notes (Signed)
Agree with management.  Marsel Gail J Bode Pieper, MD  

## 2017-11-04 DIAGNOSIS — M79642 Pain in left hand: Secondary | ICD-10-CM | POA: Diagnosis not present

## 2017-11-06 DIAGNOSIS — M79642 Pain in left hand: Secondary | ICD-10-CM | POA: Diagnosis not present

## 2017-11-09 DIAGNOSIS — M24139 Other articular cartilage disorders, unspecified wrist: Secondary | ICD-10-CM | POA: Diagnosis not present

## 2017-11-17 ENCOUNTER — Ambulatory Visit: Payer: Medicare Other | Admitting: Rheumatology

## 2017-11-18 ENCOUNTER — Encounter: Payer: Self-pay | Admitting: Internal Medicine

## 2017-11-19 DIAGNOSIS — M79642 Pain in left hand: Secondary | ICD-10-CM | POA: Diagnosis not present

## 2017-11-19 MED ORDER — ALPRAZOLAM 0.5 MG PO TABS: ORAL_TABLET | ORAL | 0 refills | Status: DC

## 2017-11-19 NOTE — Telephone Encounter (Signed)
[place] Controlled Substance Database checked. Last filled on 07/06/17. 90 day supply

## 2017-11-24 DIAGNOSIS — M79642 Pain in left hand: Secondary | ICD-10-CM | POA: Diagnosis not present

## 2017-11-26 DIAGNOSIS — M79642 Pain in left hand: Secondary | ICD-10-CM | POA: Diagnosis not present

## 2017-11-27 DIAGNOSIS — G90519 Complex regional pain syndrome I of unspecified upper limb: Secondary | ICD-10-CM | POA: Insufficient documentation

## 2017-11-27 DIAGNOSIS — G90512 Complex regional pain syndrome I of left upper limb: Secondary | ICD-10-CM | POA: Diagnosis not present

## 2017-11-30 ENCOUNTER — Ambulatory Visit (INDEPENDENT_AMBULATORY_CARE_PROVIDER_SITE_OTHER): Payer: Medicare Other | Admitting: General Practice

## 2017-11-30 DIAGNOSIS — Z7901 Long term (current) use of anticoagulants: Secondary | ICD-10-CM | POA: Diagnosis not present

## 2017-11-30 DIAGNOSIS — M79642 Pain in left hand: Secondary | ICD-10-CM | POA: Diagnosis not present

## 2017-12-02 ENCOUNTER — Other Ambulatory Visit: Payer: Self-pay | Admitting: *Deleted

## 2017-12-02 MED ORDER — DULOXETINE HCL 30 MG PO CPEP: 30.0000 mg | ORAL_CAPSULE | Freq: Two times a day (BID) | ORAL | 1 refills | Status: DC

## 2017-12-03 DIAGNOSIS — M79642 Pain in left hand: Secondary | ICD-10-CM | POA: Diagnosis not present

## 2017-12-09 DIAGNOSIS — M79642 Pain in left hand: Secondary | ICD-10-CM | POA: Diagnosis not present

## 2017-12-11 DIAGNOSIS — M79642 Pain in left hand: Secondary | ICD-10-CM | POA: Diagnosis not present

## 2017-12-14 DIAGNOSIS — G90512 Complex regional pain syndrome I of left upper limb: Secondary | ICD-10-CM | POA: Diagnosis not present

## 2017-12-22 ENCOUNTER — Telehealth: Payer: Self-pay | Admitting: Neurology

## 2017-12-22 NOTE — Telephone Encounter (Signed)
Patient would like to see if you could schedule a Doppler for her or should she see Dr. Tomi Likens first? Please Call. Thanks

## 2017-12-23 NOTE — Telephone Encounter (Signed)
Called and advised Pt to reach out to her PCP to set up carotid

## 2017-12-23 NOTE — Telephone Encounter (Signed)
This can be done by her PCP.  She doesn't need to see me

## 2017-12-24 DIAGNOSIS — M79642 Pain in left hand: Secondary | ICD-10-CM | POA: Diagnosis not present

## 2017-12-25 ENCOUNTER — Encounter: Payer: Self-pay | Admitting: Internal Medicine

## 2017-12-28 ENCOUNTER — Ambulatory Visit (INDEPENDENT_AMBULATORY_CARE_PROVIDER_SITE_OTHER): Payer: Medicare Other | Admitting: General Practice

## 2017-12-28 DIAGNOSIS — Z7901 Long term (current) use of anticoagulants: Secondary | ICD-10-CM

## 2017-12-28 LAB — POCT INR: INR: 2.8 (ref 2.0–3.0)

## 2017-12-28 NOTE — Patient Instructions (Signed)
Pre visit review using our clinic review tool, if applicable. No additional management support is needed unless otherwise documented below in the visit note.  Continue to take 5mg daily.  Re-check in 4 weeks.    

## 2017-12-31 DIAGNOSIS — G90512 Complex regional pain syndrome I of left upper limb: Secondary | ICD-10-CM | POA: Diagnosis not present

## 2018-01-02 DIAGNOSIS — G90512 Complex regional pain syndrome I of left upper limb: Secondary | ICD-10-CM | POA: Diagnosis not present

## 2018-01-08 DIAGNOSIS — G90512 Complex regional pain syndrome I of left upper limb: Secondary | ICD-10-CM | POA: Diagnosis not present

## 2018-01-11 ENCOUNTER — Encounter: Payer: Self-pay | Admitting: Internal Medicine

## 2018-01-11 DIAGNOSIS — G90512 Complex regional pain syndrome I of left upper limb: Secondary | ICD-10-CM

## 2018-01-15 DIAGNOSIS — M79642 Pain in left hand: Secondary | ICD-10-CM | POA: Diagnosis not present

## 2018-01-18 DIAGNOSIS — R310 Gross hematuria: Secondary | ICD-10-CM | POA: Diagnosis not present

## 2018-01-18 DIAGNOSIS — N816 Rectocele: Secondary | ICD-10-CM | POA: Diagnosis not present

## 2018-01-21 DIAGNOSIS — R310 Gross hematuria: Secondary | ICD-10-CM | POA: Diagnosis not present

## 2018-01-21 DIAGNOSIS — N200 Calculus of kidney: Secondary | ICD-10-CM | POA: Diagnosis not present

## 2018-01-22 DIAGNOSIS — M79642 Pain in left hand: Secondary | ICD-10-CM | POA: Diagnosis not present

## 2018-01-29 ENCOUNTER — Encounter: Payer: Self-pay | Admitting: Internal Medicine

## 2018-01-29 DIAGNOSIS — R918 Other nonspecific abnormal finding of lung field: Secondary | ICD-10-CM | POA: Diagnosis not present

## 2018-02-01 ENCOUNTER — Ambulatory Visit: Payer: Medicare Other

## 2018-02-02 ENCOUNTER — Encounter: Payer: Self-pay | Admitting: Internal Medicine

## 2018-02-02 ENCOUNTER — Ambulatory Visit: Payer: Self-pay | Admitting: Neurology

## 2018-02-11 DIAGNOSIS — R3121 Asymptomatic microscopic hematuria: Secondary | ICD-10-CM | POA: Diagnosis not present

## 2018-02-16 ENCOUNTER — Ambulatory Visit: Payer: Self-pay | Admitting: Internal Medicine

## 2018-02-19 ENCOUNTER — Other Ambulatory Visit: Payer: Self-pay | Admitting: Internal Medicine

## 2018-02-19 ENCOUNTER — Other Ambulatory Visit (INDEPENDENT_AMBULATORY_CARE_PROVIDER_SITE_OTHER): Payer: Medicare Other

## 2018-02-19 DIAGNOSIS — R7303 Prediabetes: Secondary | ICD-10-CM

## 2018-02-19 DIAGNOSIS — I10 Essential (primary) hypertension: Secondary | ICD-10-CM

## 2018-02-19 LAB — CBC WITH DIFFERENTIAL/PLATELET
BASOS PCT: 1.3 % (ref 0.0–3.0)
Basophils Absolute: 0.1 10*3/uL (ref 0.0–0.1)
EOS PCT: 2.2 % (ref 0.0–5.0)
Eosinophils Absolute: 0.1 10*3/uL (ref 0.0–0.7)
HCT: 43.6 % (ref 36.0–46.0)
HEMOGLOBIN: 14.8 g/dL (ref 12.0–15.0)
LYMPHS ABS: 1.7 10*3/uL (ref 0.7–4.0)
Lymphocytes Relative: 34.2 % (ref 12.0–46.0)
MCHC: 33.9 g/dL (ref 30.0–36.0)
MCV: 94 fl (ref 78.0–100.0)
MONO ABS: 0.5 10*3/uL (ref 0.1–1.0)
MONOS PCT: 10.2 % (ref 3.0–12.0)
Neutro Abs: 2.6 10*3/uL (ref 1.4–7.7)
Neutrophils Relative %: 52.1 % (ref 43.0–77.0)
Platelets: 198 10*3/uL (ref 150.0–400.0)
RBC: 4.64 Mil/uL (ref 3.87–5.11)
RDW: 13.2 % (ref 11.5–15.5)
WBC: 5 10*3/uL (ref 4.0–10.5)

## 2018-02-19 LAB — COMPREHENSIVE METABOLIC PANEL
ALT: 15 U/L (ref 0–35)
AST: 12 U/L (ref 0–37)
Albumin: 3.9 g/dL (ref 3.5–5.2)
Alkaline Phosphatase: 76 U/L (ref 39–117)
BUN: 21 mg/dL (ref 6–23)
CHLORIDE: 107 meq/L (ref 96–112)
CO2: 28 mEq/L (ref 19–32)
Calcium: 9.1 mg/dL (ref 8.4–10.5)
Creatinine, Ser: 0.86 mg/dL (ref 0.40–1.20)
GFR: 68.97 mL/min (ref >60.00)
GLUCOSE: 113 mg/dL — AB (ref 70–99)
POTASSIUM: 3.9 meq/L (ref 3.5–5.1)
SODIUM: 141 meq/L (ref 135–145)
TOTAL PROTEIN: 6.9 g/dL (ref 6.0–8.3)
Total Bilirubin: 0.6 mg/dL (ref 0.2–1.2)

## 2018-02-19 LAB — LIPID PANEL
CHOL/HDL RATIO: 2
Cholesterol: 174 mg/dL (ref 0–200)
HDL: 73.5 mg/dL (ref >39.00)
LDL CALC: 79 mg/dL (ref 0–99)
NONHDL: 100.78
Triglycerides: 108 mg/dL (ref 0.0–149.0)
VLDL: 21.6 mg/dL (ref 0.0–40.0)

## 2018-02-19 LAB — HEMOGLOBIN A1C: HEMOGLOBIN A1C: 5.3 % (ref 4.6–6.5)

## 2018-02-20 ENCOUNTER — Encounter: Payer: Self-pay | Admitting: Internal Medicine

## 2018-02-21 NOTE — Assessment & Plan Note (Signed)
Lipids well controlled  Continue statin 

## 2018-02-21 NOTE — Progress Notes (Signed)
Subjective:    Patient ID: Amanda Turner, female    DOB: October 11, 1945, 72 y.o.   MRN: [id number]  HPI The patient is here for follow up.  Hypertension: She is taking her medication daily. She is compliant with a low sodium diet.  She denies chest pain, palpitations, edema, shortness of breath and regular headaches. She is not exercising regularly.  She does not monitor her blood pressure at home.    Hyperlipidemia: She is taking her medication daily. She is compliant with a low fat/cholesterol diet. She is not exercising regularly. She denies myalgias.   Prediabetes:  She is compliant with a low sugar/carbohydrate diet.  She is not exercising regularly.  Depression: She is taking her medication daily as prescribed. She denies any side effects from the medication. She feels her depression is well controlled and she is happy with her current dose of medication.   Anxiety: She is taking her medication daily as prescribed. She denies any side effects from the medication. She feels her anxiety is well controlled and she is happy with her current dose of medication.   GERD:  She is taking her medication daily as prescribed.  She denies any GERD symptoms and feels her GERD is well controlled.   CRPS left wrist:  She has a lot of pain.  She is following with orthopedics.  She had an US done today.  She has done OT.  She had been told that the bone may not of healed.  She is not currently taking any vitamin D daily or calcium.  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  She does follow with our office for monitoring.  Nodules in CT:  Ct scan done by urology.  Advised f/yu in one year.  She will have a CT scan sent to me.  Medications and allergies reviewed with patient and updated if appropriate.  Patient Active Problem List   Diagnosis Date Noted  . Left wrist pain 09/10/2017  . Leg swelling 08/30/2017  . Ulnar impaction syndrome, left  02/23/2017  . Long term (current) use of anticoagulants 01/14/2017  . Chronic jaw pain 10/27/2016  . Prediabetes 08/06/2016  . Chest pain 05/07/2016  . Subluxation of extensor carpi ulnaris tendon, left, initial encounter 03/24/2016  . Left lumbar radiculopathy 02/12/2016  . Post-concussion headache 01/25/2016  . Achilles tendinosis 01/17/2016  . Pain in left wrist 01/17/2016  . Pain of left heel 12/26/2015  . Cervical radiculopathy at C5 12/13/2015  . Low back pain 12/13/2015  . Leg cramps 12/13/2015  . Anxiety 10/23/2015  . Gait instability 10/23/2015  . Edema 10/23/2015  . Encounter for therapeutic drug monitoring 10/30/2014  . Carotid stenosis 09/06/2012  . Metabolic syndrome [id number]/6384  . Lupus anticoagulant with hypercoagulable state ([place]) 06/23/2011  . CVA (cerebrovascular accident) ([place]) 03/06/2011  . Hematuria 02/11/2010  . 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 Hyperlipidemia 12/04/2006  . Depression 12/04/2006  . Essential hypertension 12/04/2006    Current Outpatient Medications on File Prior to Visit  Medication Sig Dispense Refill  . ALPRAZolam (XANAX) 0.5 MG tablet TAKE 1/2 TABLET BY MOUTH IN MORNING AND 1 TABLET IN  EVENING 135 tablet 0  . aspirin 81 MG tablet Take 81 mg by mouth daily.      . DULoxetine (CYMBALTA) 30 MG capsule Take 1 capsule (30 mg total) by mouth 2 (two) times daily. 180 capsule 1  . famotidine (PEPCID) 20 MG tablet TAKE 1 TABLET  BY MOUTH TWO  TIMES DAILY 180 tablet 3  . furosemide (LASIX) 40 MG tablet Take 40 mg twice per day 180 tablet 1  . lisinopril (PRINIVIL,ZESTRIL) 5 MG tablet TAKE 1 TABLET BY MOUTH  DAILY 90 tablet 3  . potassium chloride SA (K-DUR,KLOR-CON) 20 MEQ tablet TAKE 1 TABLET BY MOUTH TWO  TIMES DAILY 180 tablet 3  . simvastatin (ZOCOR) 20 MG tablet TAKE 1 TABLET BY MOUTH AT  BEDTIME 90 tablet 3  . topiramate (TOPAMAX) 50 MG tablet Take by mouth.    . traMADol (ULTRAM) 50 MG tablet Take 50 mg by mouth every 6 (six)  hours as needed.    . warfarin (COUMADIN) 5 MG tablet Take 1 tablet (5 mg total) by mouth daily. 90 tablet 1   No current facility-administered medications on file prior to visit.     Past Medical History:  Diagnosis Date  . Anxiety   . 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 Depression   . Elevated LFT's   . HA (headache)   . Hyperlipemia   . Hypertension   . Intracranial hemorrhage, spontaneous intraparenchymal, associated with coagulopathy, remote, resolved 06/23/2011  . Lupus anticoagulant disorder ([place])   . Lupus anticoagulant with hypercoagulable state ([place]) 06/23/2011  . Lupus erythematosus 06/23/2011  . Stroke [facility])     Past Surgical History:  Procedure Laterality Date  . ABDOMINAL HYSTERECTOMY    . TRANSPHENOIDAL PITUITARY RESECTION      Social History   Socioeconomic History  . Marital status: Married    Spouse name: Not on file  . Number of children: 2  . Years of education: Not on file  . Highest education level: Not on file  Occupational History    Employer: UNEMPLOYED  Social Needs  . Financial resource strain: Not on file  . Food insecurity:    Worry: Not on file    Inability: Not on file  . Transportation needs:    Medical: Not on file    Non-medical: Not on file  Tobacco Use  . Smoking status: Never Smoker  . Smokeless tobacco: Never Used  Substance and Sexual Activity  . Alcohol use: No    Alcohol/week: 0.0 standard drinks  . Drug use: No  . Sexual activity: Not on file  Lifestyle  . Physical activity:    Days per week: Not on file    Minutes per session: Not on file  . Stress: Not on file  Relationships  . Social connections:    Talks on phone: Not on file    Gets together: Not on file    Attends religious service: Not on file    Active member of club or organization: Not on file    Attends meetings of clubs or organizations: Not on file    Relationship status: Not on file  Other Topics Concern    . Not on file  Social History Narrative   2 cups of coffee daily   Retired    Family History  Problem Relation Age of Onset  . Diabetes Brother   . Diabetes Sister   . Hypertension Mother   . Dementia Mother   . Bladder Cancer Brother   . Colon cancer Neg Hx     Review of Systems  Constitutional: Negative for chills and fever.  Respiratory: Negative for cough, shortness of breath and wheezing.   Cardiovascular: Negative for chest pain, palpitations and leg swelling.  Neurological: Negative for light-headedness and  headaches.       Objective:   Vitals:   02/24/18 1527  BP: 124/68  Pulse: 62  Resp: 16  Temp: 98.1 F (36.7 C)  SpO2: 99%   BP Readings from Last 3 Encounters:  02/24/18 124/68  09/10/17 124/64  08/14/17 136/74   Wt Readings from Last 3 Encounters:  02/24/18 192 lb 12.8 oz (87.5 kg)  09/10/17 202 lb (91.6 kg)  08/14/17 195 lb (88.5 kg)   Body mass index is 33.09 kg/m.   Physical Exam    Constitutional: Appears well-developed and well-nourished. No distress.  HENT:  Head: Normocephalic and atraumatic.  Neck: Neck supple. No tracheal deviation present. No thyromegaly present.  No cervical lymphadenopathy Cardiovascular: Normal rate, regular rhythm and normal heart sounds.   No murmur heard. No carotid bruit .  No edema Pulmonary/Chest: Effort normal and breath sounds normal. No respiratory distress. No has no wheezes. No rales.  Skin: Skin is warm and dry. Not diaphoretic.  Psychiatric: Normal mood and affect. Behavior is normal.      Assessment & Plan:    See Problem List for Assessment and Plan of chronic medical problems.

## 2018-02-21 NOTE — Assessment & Plan Note (Addendum)
Sugars have been in the normal range Will continue to monitor Low sugar/carb diet Regular exercise encouraged

## 2018-02-22 MED ORDER — FUROSEMIDE 40 MG PO TABS: ORAL_TABLET | ORAL | 1 refills | Status: DC

## 2018-02-24 ENCOUNTER — Ambulatory Visit (INDEPENDENT_AMBULATORY_CARE_PROVIDER_SITE_OTHER): Payer: Medicare Other | Admitting: Internal Medicine

## 2018-02-24 ENCOUNTER — Encounter: Payer: Self-pay | Admitting: Internal Medicine

## 2018-02-24 VITALS — BP 124/68 | HR 62 | Temp 98.1°F | Resp 16 | Ht 64.0 in | Wt 192.8 lb

## 2018-02-24 DIAGNOSIS — E2839 Other primary ovarian failure: Secondary | ICD-10-CM

## 2018-02-24 DIAGNOSIS — R7303 Prediabetes: Secondary | ICD-10-CM

## 2018-02-24 DIAGNOSIS — M79642 Pain in left hand: Secondary | ICD-10-CM | POA: Diagnosis not present

## 2018-02-24 DIAGNOSIS — Z23 Encounter for immunization: Secondary | ICD-10-CM | POA: Diagnosis not present

## 2018-02-24 DIAGNOSIS — D6862 Lupus anticoagulant syndrome: Secondary | ICD-10-CM

## 2018-02-24 DIAGNOSIS — I10 Essential (primary) hypertension: Secondary | ICD-10-CM

## 2018-02-24 DIAGNOSIS — E782 Mixed hyperlipidemia: Secondary | ICD-10-CM

## 2018-02-24 DIAGNOSIS — F419 Anxiety disorder, unspecified: Secondary | ICD-10-CM | POA: Diagnosis not present

## 2018-02-24 DIAGNOSIS — K219 Gastro-esophageal reflux disease without esophagitis: Secondary | ICD-10-CM

## 2018-02-24 DIAGNOSIS — Z13820 Encounter for screening for osteoporosis: Secondary | ICD-10-CM

## 2018-02-24 DIAGNOSIS — F32A Depression, unspecified: Secondary | ICD-10-CM

## 2018-02-24 DIAGNOSIS — S62102S Fracture of unspecified carpal bone, left wrist, sequela: Secondary | ICD-10-CM

## 2018-02-24 DIAGNOSIS — S62109A Fracture of unspecified carpal bone, unspecified wrist, initial encounter for closed fracture: Secondary | ICD-10-CM | POA: Insufficient documentation

## 2018-02-24 DIAGNOSIS — F329 Major depressive disorder, single episode, unspecified: Secondary | ICD-10-CM

## 2018-02-24 MED ORDER — VITAMIN D (ERGOCALCIFEROL) 1.25 MG (50000 UNIT) PO CAPS: 50000.0000 [IU] | ORAL_CAPSULE | ORAL | 0 refills | Status: DC

## 2018-02-24 NOTE — Assessment & Plan Note (Signed)
Controlled, stable Continue current dose of medication  

## 2018-02-24 NOTE — Patient Instructions (Addendum)
  Reviewed blood work.    Flu immunization administered today.    Medications reviewed and updated.  Changes include :   Take weekly high dose vitamin D for two months and then start daily vitamin D 2000 units daily ( over the counter)    Please followup in 6 months

## 2018-02-24 NOTE — Assessment & Plan Note (Signed)
Controlled, stable Continue current dose of medication - cymbalta twice daily and xanax twice daily

## 2018-02-24 NOTE — Assessment & Plan Note (Signed)
Taking warfarin-we will need to be on it lifelong

## 2018-02-24 NOTE — Assessment & Plan Note (Signed)
Following with orthopedics ?  Truly healed or not Has developed chronic regional pain syndrome Due for bone density-ordered We will give high-dose vitamin D for 2 months-then start 2000 units of vitamin D daily

## 2018-02-24 NOTE — Assessment & Plan Note (Signed)
BP well controlled Current regimen effective and well tolerated Continue current medications at current doses cmp reviewed 

## 2018-02-24 NOTE — Assessment & Plan Note (Signed)
GERD controlled Continue daily medication  

## 2018-03-04 DIAGNOSIS — G90512 Complex regional pain syndrome I of left upper limb: Secondary | ICD-10-CM | POA: Diagnosis not present

## 2018-03-04 DIAGNOSIS — M24132 Other articular cartilage disorders, left wrist: Secondary | ICD-10-CM | POA: Diagnosis not present

## 2018-03-05 ENCOUNTER — Ambulatory Visit (INDEPENDENT_AMBULATORY_CARE_PROVIDER_SITE_OTHER)
Admission: RE | Admit: 2018-03-05 | Discharge: 2018-03-05 | Disposition: A | Discharge status: Home / Self Care | Payer: Medicare Other | Source: Ambulatory Visit | Attending: Internal Medicine | Admitting: Internal Medicine

## 2018-03-05 DIAGNOSIS — Z13820 Encounter for screening for osteoporosis: Secondary | ICD-10-CM

## 2018-03-05 DIAGNOSIS — E2839 Other primary ovarian failure: Secondary | ICD-10-CM
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
## 2018-03-07 ENCOUNTER — Encounter: Payer: Self-pay | Admitting: Internal Medicine

## 2018-03-07 DIAGNOSIS — M8580 Other specified disorders of bone density and structure, unspecified site: Secondary | ICD-10-CM | POA: Insufficient documentation

## 2018-03-11 ENCOUNTER — Ambulatory Visit: Payer: Medicare Other | Admitting: Neurology

## 2018-03-11 ENCOUNTER — Ambulatory Visit: Payer: Self-pay | Admitting: Neurology

## 2018-03-11 ENCOUNTER — Encounter: Payer: Self-pay | Admitting: Internal Medicine

## 2018-03-11 ENCOUNTER — Encounter: Payer: Self-pay | Admitting: Neurology

## 2018-03-11 VITALS — BP 112/74 | HR 72 | Ht 64.0 in | Wt 193.0 lb

## 2018-03-11 DIAGNOSIS — G90522 Complex regional pain syndrome I of left lower limb: Secondary | ICD-10-CM | POA: Diagnosis not present

## 2018-03-11 DIAGNOSIS — R404 Transient alteration of awareness: Secondary | ICD-10-CM

## 2018-03-11 DIAGNOSIS — I639 Cerebral infarction, unspecified: Secondary | ICD-10-CM

## 2018-03-11 DIAGNOSIS — M79602 Pain in left arm: Secondary | ICD-10-CM

## 2018-03-11 DIAGNOSIS — G90512 Complex regional pain syndrome I of left upper limb: Secondary | ICD-10-CM
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
## 2018-03-11 NOTE — Patient Instructions (Signed)
1.  We will check NCV-EMG of left upper extremity 2.  We will check EEG 3.  Recommend that you ask Dr. Quay Burow about referral to psychiatry

## 2018-03-12 ENCOUNTER — Other Ambulatory Visit: Payer: Self-pay | Admitting: Internal Medicine

## 2018-03-15 ENCOUNTER — Telehealth: Payer: Self-pay | Admitting: Neurology

## 2018-03-15 NOTE — Telephone Encounter (Signed)
Looking in her chart, it looks like she is transferring her care to [facility] and has an appointment with them next month.  I recommend that any testing going forward should be ordered by them since they will be taking over her care.

## 2018-03-15 NOTE — Telephone Encounter (Signed)
Called and LM for Pt to return my call

## 2018-03-15 NOTE — Telephone Encounter (Signed)
Pt called back, advised her will need to get this from [facility]. Pt states this is really for her Ortho, I advised her to have her Ortho order the test from them. Pt verbalized understanding

## 2018-03-15 NOTE — Telephone Encounter (Signed)
Patient called in regarding her EMG and wanting to know is she could have it done at the [facility]? She said they could see her sooner? She said the fax # is [phone]. Thanks

## 2018-03-16 ENCOUNTER — Other Ambulatory Visit: Payer: Self-pay

## 2018-03-18 ENCOUNTER — Encounter: Payer: Self-pay | Admitting: Internal Medicine

## 2018-03-19 ENCOUNTER — Other Ambulatory Visit: Payer: Self-pay | Admitting: General Practice

## 2018-03-19 ENCOUNTER — Telehealth: Payer: Self-pay | Admitting: Internal Medicine

## 2018-03-19 MED ORDER — WARFARIN SODIUM 5 MG PO TABS: ORAL_TABLET | ORAL | 0 refills | Status: DC

## 2018-03-19 NOTE — Telephone Encounter (Signed)
Medication was sent to [facility] per [facility].

## 2018-03-19 NOTE — Telephone Encounter (Signed)
Copied from [place] [id number]. Topic: Quick Communication - See Telephone Encounter >> Mar 19, 2018  2:10 PM Hewitt Shorts wrote: Pt is calling back to let cindy in the coumadin clinic that the warfarin is needing to be sent to [facility]  Best number [phone]

## 2018-03-19 NOTE — Telephone Encounter (Signed)
Noted  

## 2018-03-22 ENCOUNTER — Other Ambulatory Visit: Payer: Self-pay | Admitting: General Practice

## 2018-03-22 ENCOUNTER — Ambulatory Visit (INDEPENDENT_AMBULATORY_CARE_PROVIDER_SITE_OTHER): Payer: Medicare Other | Admitting: General Practice

## 2018-03-22 DIAGNOSIS — Z7901 Long term (current) use of anticoagulants: Secondary | ICD-10-CM | POA: Diagnosis not present

## 2018-03-22 DIAGNOSIS — I639 Cerebral infarction, unspecified: Secondary | ICD-10-CM

## 2018-03-22 LAB — POCT INR: INR: 2.4 (ref 2.0–3.0)

## 2018-03-22 NOTE — Patient Instructions (Addendum)
Pre visit review using our clinic review tool, if applicable. No additional management support is needed unless otherwise documented below in the visit note.  Continue to take 5 mg daily.  Re-check in 6 weeks.

## 2018-03-22 NOTE — Progress Notes (Signed)
I have reviewed and agree with this plan  

## 2018-03-23 NOTE — Progress Notes (Signed)
Agree with management.  Adante Courington J Katori Wirsing, MD  

## 2018-04-05 ENCOUNTER — Telehealth: Payer: Self-pay | Admitting: Neurology

## 2018-04-05 ENCOUNTER — Ambulatory Visit: Payer: Medicare Other | Admitting: Neurology

## 2018-04-05 ENCOUNTER — Encounter: Payer: Self-pay | Admitting: Neurology

## 2018-04-05 VITALS — BP 119/75 | HR 78 | Ht 64.0 in | Wt 194.4 lb

## 2018-04-05 DIAGNOSIS — R413 Other amnesia: Secondary | ICD-10-CM

## 2018-04-05 DIAGNOSIS — R6889 Other general symptoms and signs: Secondary | ICD-10-CM | POA: Diagnosis not present

## 2018-04-05 NOTE — Telephone Encounter (Signed)
[employer] medicare order sent to [facility]. No auth they will reach out to the pt to schedule.  °

## 2018-04-05 NOTE — Telephone Encounter (Signed)
Patient was seen for the first time at our office today to discuss her memory issues with Dr. [name]. At [place], patient asked me to see if she could be scheduled at our office for a NCV/EMG. She informed me that she is currently scheduled to have this test done at [facility]. Patient's arm issues were not discussed at this appointment. I messaged nurse Katrina to ask if this was possible. Nurse informed me that she is only being treated at our office for memory and that she could not be scheduled for NCV/EMG testing per Dr. [name].

## 2018-04-05 NOTE — Telephone Encounter (Signed)
Per Dr. [name] pt left arm and hand is a chronic condition not neurological. Pt had surgery in 07/2017 and is being treated by pain control and orthopedic for this issue. Patients complaints today was for memory issues only. We are not managing her left arm and hand issues.

## 2018-04-05 NOTE — Progress Notes (Signed)
[facility] [address] [place] [address] [phone]       OFFICE CONSULT NOTE  [name] Date of Birth:  June 14, 1945 Medical Record Number:  [id number]   Referring MD:  Billey Gosling Reason for Referral:   Stroke and memory loss  HPI: Amanda Turner is seen today for initial office consultation visit.  She is accompanied by her husband.  History is obtained from them and review of electronic medical records.  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  She is also hypercoagulable due to positive lupus anticoagulant and is on long-term warfarin.Long-standing history of anxiety and depression as well.  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  She has been followed by Dr. Metta Clines with [facility] but asked for a second opinion and transfer of neurological care to see me because she felt her symptoms were more consistent with new strokes that she may be having.  She states that for the last 1 year she has had multiple brief episodes where she is passing out and having diminished attention.  She is wide awake but will be watching television all of a sudden she lost her husband what happened was going on.  As if she blanks out.  There has been does not describe a blank look on her face or twitching's or automatic movements.  She does not appear to be sleepy after these episodes which are very short lasting.  She is also been known to get confused and disoriented easily.  She has trouble following commands.  The husband cannot leave her alone  anymore.  She has not had any brain imaging study or lab work done to look for reversible causes of memory loss.  His she denies any significant headaches, falls, delusions, hallucinations.  She has long-standing history of anxiety and depression and is currently taking Cymbalta.  The patient feels that her symptoms are not necessarily related to depression.  She is concerned she may be having more strokes.  She denies any prior history of seizures, loss of consciousness, tonic-clonic activity or tongue bite or injury.  She has remote history of left hemispheric hemorrhagic infarcts following complications from transsphenoidal pituitary resection for pituitary tumor which was complicated by thrombosis and spasm of the left middle cerebral artery.  She had residual aphasia and right hemiparesis but over the years has obtained near complete improvement.  In recent years she had left wrist pain and numbness for which she underwent surgery in March 2019 but still has persistent pain and paresthesias which have been thought to be complex regional pain syndrome.  ROS:   14 system review of systems is positive for fatigue, hearing loss, easy bruising and bleeding, joint pain and swelling, memory loss, confusion, headache, numbness, slurred speech, dizziness, depression, anxiety, not enough sleep, decreased energy, insomnia and all other systems negative PMH:  Past Medical History:  Diagnosis Date  . Anxiety   . 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 Depression   . Elevated LFT's   . HA (headache)   . Hyperlipemia   . Hypertension   .  Intracranial hemorrhage, spontaneous intraparenchymal, associated with coagulopathy, remote, resolved 06/23/2011  . Lupus anticoagulant disorder ([place])   . Lupus anticoagulant with hypercoagulable state ([place]) 06/23/2011  . Lupus erythematosus 06/23/2011  . Stroke [facility])     Social History:  Social History   Socioeconomic History  .  Marital status: Married    Spouse name: Not on file  . Number of children: 2  . Years of education: Not on file  . Highest education level: Not on file  Occupational History    Employer: UNEMPLOYED  Social Needs  . Financial resource strain: Not on file  . Food insecurity:    Worry: Not on file    Inability: Not on file  . Transportation needs:    Medical: Not on file    Non-medical: Not on file  Tobacco Use  . Smoking status: Never Smoker  . Smokeless tobacco: Never Used  Substance and Sexual Activity  . Alcohol use: No    Alcohol/week: 0.0 standard drinks  . Drug use: No  . Sexual activity: Not on file  Lifestyle  . Physical activity:    Days per week: Not on file    Minutes per session: Not on file  . Stress: Not on file  Relationships  . Social connections:    Talks on phone: Not on file    Gets together: Not on file    Attends religious service: Not on file    Active member of club or organization: Not on file    Attends meetings of clubs or organizations: Not on file    Relationship status: Not on file  . Intimate partner violence:    Fear of current or ex partner: Not on file    Emotionally abused: Not on file    Physically abused: Not on file    Forced sexual activity: Not on file  Other Topics Concern  . Not on file  Social History Narrative   2 cups of coffee daily   Retired    Medications:   Current Outpatient Medications on File Prior to Visit  Medication Sig Dispense Refill  . ALPRAZolam (XANAX) 0.5 MG tablet TAKE 1/2 TABLET BY MOUTH IN MORNING AND 1 TABLET IN  EVENING 135 tablet 0  . aspirin 81 MG tablet Take 81 mg by mouth daily.      . DULoxetine (CYMBALTA) 30 MG capsule TAKE 1 CAPSULE BY MOUTH TWO TIMES DAILY 180 capsule 1  . famotidine (PEPCID) 20 MG tablet TAKE 1 TABLET BY MOUTH TWO  TIMES DAILY 180 tablet 3  . furosemide (LASIX) 40 MG tablet Take 40 mg twice per day 180 tablet 1  . lisinopril (PRINIVIL,ZESTRIL) 5 MG tablet TAKE 1 TABLET BY  MOUTH  DAILY 90 tablet 3  . potassium chloride SA (K-DUR,KLOR-CON) 20 MEQ tablet TAKE 1 TABLET BY MOUTH TWO  TIMES DAILY 180 tablet 3  . simvastatin (ZOCOR) 20 MG tablet TAKE 1 TABLET BY MOUTH AT  BEDTIME 90 tablet 3  . traMADol (ULTRAM) 50 MG tablet Take 50 mg by mouth every 6 (six) hours as needed.    . Vitamin D, Ergocalciferol, (DRISDOL) 50000 units CAPS capsule Take 1 capsule (50,000 Units total) by mouth every 7 (seven) days. 8 capsule 0  . warfarin (COUMADIN) 5 MG tablet Take 1 tablet daily by mouth or As directed by anticoagulation clinic.  90 day 90 tablet 0  . warfarin (COUMADIN) 5 MG tablet warfarin 5 mg tablet   5 mg by oral route.  No current facility-administered medications on file prior to visit.     Allergies:   Allergies  Allergen Reactions  . Gabapentin Nausea Only    Dizziness,   . Cymbalta [Duloxetine Hcl] Itching and Nausea Only    headaches  . Lisinopril Cough  . Losartan Swelling  . Peanut Oil     REACTION: hives  . Shrimp Flavor     hives  . Sulfonamide Derivatives     REACTION: shortness of breath    Physical Exam General: well developed, well nourished elderly lady, seated, in no evident distress Head: head normocephalic and atraumatic.   Neck: supple with no carotid or supraclavicular bruits Cardiovascular: regular rate and rhythm, no murmurs Musculoskeletal: no deformity Skin:  no rash/petichiae Vascular:  Normal pulses all extremities  Neurologic Exam Mental Status: Awake and fully alert. Oriented to place and time. Recent and remote memory intact. Attention span, concentration and fund of knowledge appropriate. Mood and affect appropriate.  Mini-Mental status exam score 27/30 with deficits on the in orientation and recall.  Geriatric depression scale scored 14 suggestive of moderate depression.  Clock drawing 4/4.  Able to name 8 animals with [employer].  Unable to copy intersecting pentagons.. No aphasia but occasional word hesitancy and slight  nonfluent speech.  Able to name repeat comprehend quite well. Cranial Nerves: Fundoscopic exam reveals sharp disc margins. Pupils equal, briskly reactive to light. Extraocular movements full without nystagmus. Visual fields full to confrontation. Hearing intact. Facial sensation intact. Face, tongue, palate moves normally and symmetrically.  Motor: Normal bulk and tone. Normal strength in all tested extremity muscles. Sensory.:  Subjective diminished touch and pinprick sensation in the right upper and lower extremity.  She splits the forehead for touch pinprick as well as vibration sensation.  Diminished touch pinprick and vibration sensation in the left hand compared to the right..  Coordination: Rapid alternating movements normal in all extremities. Finger-to-nose and heel-to-shin performed accurately bilaterally. Gait and Station: Arises from chair without difficulty. Stance is normal. Gait demonstrates normal stride length and balance . Able to heel, toe and tandem walk without difficulty.  Reflexes: 1+ and symmetric. Toes downgoing.   NIHSS  0 Modified Rankin 2   ASSESSMENT: 72 year old lady with one year history of mild memory and cognitive difficulties as well as episodes of diminished attentiveness of unclear etiology.  Possibilities include complex partial seizures versus mild post stroke cognitive impairment.. 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  She is also hypercoagulable due to positive lupus anticoagulant and is on long-term warfarin.Long-standing history of anxiety and depression as well.  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  I recommend further evaluation by checking MRI scan of the brain, EEG and lab work for reversible causes of memory impairment.  Continue warfarin for her lupus anticoagulant state and maintain strict blood pressure control with blood pressure goal below 130/90 and lipids with LDL cholesterol goal below 70 mg percent for stroke prevention. She needs to optimize her treatment for depression  with her primary Md or seek referral to  psychiatrist .  Greater than 50% time during this 55-minute consultation visit was spent on counseling and coordination of care about her remote stroke, memory loss and episodes of decreased attentiveness, discussion about untreated depression and answering questions return for follow-up in 2 months with my nurse practitioner Janett Billow or call earlier if necessary. Antony Contras, MD  [facility] [address] [place], [place] [address]  Phone [phone] Fax [phone] Note: This document was prepared with digital dictation and possible smart phrase technology. Any transcriptional errors that result from this process are unintentional.

## 2018-04-05 NOTE — Patient Instructions (Addendum)
I had a long discussion with the patient and her husband regarding her episodes of decreased attentiveness and speech and memory difficulties which may represent complex partial seizures or mild cognitive impairment.  I recommend further evaluation by checking MRI scan of the brain, EEG and lab work for reversible causes of memory impairment.  Continue warfarin for her lupus anticoagulant state and maintain strict blood pressure control with blood pressure goal below 130/90 and lipids with LDL cholesterol goal below 70 mg percent for stroke prevention. She needs to optimize her treatment for depression with her primary Md or seek referral to  psychiatrist   Return for follow-up in 2 months with my nurse practitioner Janett Billow or call earlier if necessary.

## 2018-04-05 NOTE — Telephone Encounter (Signed)
I saw her for memory complaints only. Incase this was previuously scheduled  elsewhere she should keep this appointment. Her hand pain is managed by ortho pain management and I am not involved in care of this issue. No need to do this at [facility]

## 2018-04-06 DIAGNOSIS — G90512 Complex regional pain syndrome I of left upper limb: Secondary | ICD-10-CM | POA: Diagnosis not present

## 2018-04-06 DIAGNOSIS — M24139 Other articular cartilage disorders, unspecified wrist: Secondary | ICD-10-CM | POA: Diagnosis not present

## 2018-04-06 LAB — DEMENTIA PANEL
HOMOCYSTEINE: 13 umol/L (ref 0.0–15.0)
RPR Ser Ql: NONREACTIVE
TSH: 0.826 u[IU]/mL (ref 0.450–4.500)
VITAMIN B 12: 390 pg/mL (ref 232–1245)

## 2018-04-10 ENCOUNTER — Encounter: Payer: Self-pay | Admitting: Internal Medicine

## 2018-04-13 ENCOUNTER — Encounter

## 2018-04-13 ENCOUNTER — Encounter: Payer: Self-pay | Admitting: Neurology

## 2018-04-15 ENCOUNTER — Ambulatory Visit (INDEPENDENT_AMBULATORY_CARE_PROVIDER_SITE_OTHER): Payer: Medicare Other | Admitting: Neurology

## 2018-04-15 DIAGNOSIS — M79602 Pain in left arm: Secondary | ICD-10-CM | POA: Diagnosis not present

## 2018-04-15 NOTE — Procedures (Signed)
[facility]  [address]  [place], [place] [address] Tel: [phone] Fax:  [phone] Test Date:  04/15/2018  Patient: Amanda Turner DOB: [birth year] Physician: Narda Amber, DO  Sex: Female Height: 5\' 4"  Ref Phys: Metta Clines, DO  ID#: [id number] Temp: 33.0C Technician:    Patient Complaints: This is a 72 year old female referred for evaluation of left arm pain.  NCV & EMG Findings: Extensive electrodiagnostic testing of the left upper extremity shows:  1. Left median, ulnar, and mixed palmar sensory responses are within normal limits. 2. Left median and ulnar motor responses are within normal limits. 3. There is no evidence of active or chronic motor axonal loss changes affecting any of the tested muscles.  Motor unit configuration and recruitment pattern is within normal limits.  Impression: This is a normal study of the left upper extremity.  In particular, there is no evidence of carpal tunnel syndrome or cervical radiculopathy.   ___________________________ Narda Amber, DO    Nerve Conduction Studies Anti Sensory Summary Table   Site NR Peak (ms) Norm Peak (ms) P-T Amp (V) Norm P-T Amp  Left Median Anti Sensory (2nd Digit)  33C  Wrist    2.9 <3.8 64.0 >10  Left Ulnar Anti Sensory (5th Digit)  33C  Wrist    2.4 <3.2 47.8 >5   Motor Summary Table   Site NR Onset (ms) Norm Onset (ms) O-P Amp (mV) Norm O-P Amp Site1 Site2 Delta-0 (ms) Dist (cm) Vel (m/s) Norm Vel (m/s)  Left Median Motor (Abd Poll Brev)  33C  Wrist    3.0 <4.0 7.5 >5 Elbow Wrist 4.1 26.0 63 >50  Elbow    7.1  7.3         Left Ulnar Motor (Abd Dig Minimi)  33C  Wrist    2.2 <3.1 8.5 >7 B Elbow Wrist 3.3 21.0 64 >50  B Elbow    5.5  8.3  A Elbow B Elbow 1.6 10.0 63 >50  A Elbow    7.1  8.1          Comparison Summary Table   Site NR Peak (ms) Norm Peak (ms) P-T Amp (V) Site1 Site2 Delta-P (ms) Norm Delta (ms)  Left Median/Ulnar Palm Comparison (Wrist - 8cm)  33C   Median Palm    1.7 <2.2 83.9 Median Palm Ulnar Palm 0.3   Ulnar Palm    1.4 <2.2 18.7       EMG   Side Muscle Ins Act Fibs Psw Fasc Number Recrt Dur Dur. Amp Amp. Poly Poly. Comment  Left 1stDorInt Nml Nml Nml Nml Nml Nml Nml Nml Nml Nml Nml Nml N/A  Left PronatorTeres Nml Nml Nml Nml Nml Nml Nml Nml Nml Nml Nml Nml N/A  Left Biceps Nml Nml Nml Nml Nml Nml Nml Nml Nml Nml Nml Nml N/A  Left Triceps Nml Nml Nml Nml Nml Nml Nml Nml Nml Nml Nml Nml N/A  Left Deltoid Nml Nml Nml Nml Nml Nml Nml Nml Nml Nml Nml Nml N/A      Waveforms:

## 2018-04-20 ENCOUNTER — Encounter: Payer: Self-pay | Admitting: *Deleted

## 2018-04-29 ENCOUNTER — Other Ambulatory Visit: Payer: Self-pay

## 2018-05-04 ENCOUNTER — Encounter: Payer: Self-pay | Admitting: Neurology

## 2018-05-10 ENCOUNTER — Ambulatory Visit (INDEPENDENT_AMBULATORY_CARE_PROVIDER_SITE_OTHER): Payer: Medicare Other | Admitting: General Practice

## 2018-05-10 DIAGNOSIS — Z7901 Long term (current) use of anticoagulants: Secondary | ICD-10-CM

## 2018-05-10 LAB — POCT INR: INR: 2.5 (ref 2.0–3.0)

## 2018-05-10 NOTE — Patient Instructions (Addendum)
Pre visit review using our clinic review tool, if applicable. No additional management support is needed unless otherwise documented below in the visit note.  Continue to take 5 mg daily.  Re-check in 6 weeks.

## 2018-05-11 ENCOUNTER — Other Ambulatory Visit: Payer: Self-pay

## 2018-05-11 NOTE — Patient Outreach (Signed)
[facility]) Care Management  05/11/2018  Amanda Turner 1945/09/20 [id number]   Medication Adherence call to Mrs. Amanda Turner Left a message for patient to call back patient is due on Lisinopril 5 mg and Simvastatin 20 mg. Mrs. Amanda Turner is showing past due under [place].    Welcome Management Direct Dial [phone]  Fax [phone] [email or web address]

## 2018-05-13 DIAGNOSIS — Z1231 Encounter for screening mammogram for malignant neoplasm of breast: Secondary | ICD-10-CM | POA: Diagnosis not present

## 2018-05-13 LAB — HM MAMMOGRAPHY

## 2018-05-13 NOTE — Progress Notes (Signed)
Subjective:    Patient ID: Amanda Turner, female    DOB: 09-30-1945, 73 y.o.   MRN: [id number]  HPI The patient is here for follow up.  Left hand pain:  She has CRPS and an US showed CTS.  She continues to have chronic left wrist pain.  Dr Apolonio Schneiders is thinking about surgery for CTS. she is unsure how much this will help with her current symptoms.  She wonders about getting a second opinion.  Her wrist pain is significantly affecting the quality of her life.  Anxiety, depression: She is taking her medication daily as prescribed.  Her anxiety and depression is well controlled.  She is dealing with chronic pain primarily of her left wrist that is causing increased anxiety and depression.  Orthopedics have convinced her to see a therapist and she has agreed.  She does require referral.  Medications and allergies reviewed with patient and updated if appropriate.  Patient Active Problem List   Diagnosis Date Noted  . Osteopenia 03/07/2018  . Wrist fracture, closed 02/24/2018  . GERD (gastroesophageal reflux disease) 02/24/2018  . Left wrist pain 09/10/2017  . Leg swelling 08/30/2017  . 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 Long term (current) use of anticoagulants 01/14/2017  . Chronic jaw pain 10/27/2016  . Prediabetes 08/06/2016  . Chest pain 05/07/2016  . Subluxation of extensor carpi ulnaris tendon, left, initial encounter 03/24/2016  . Left lumbar radiculopathy 02/12/2016  . Post-concussion headache 01/25/2016  . Achilles tendinosis 01/17/2016  . Pain in left wrist 01/17/2016  . Pain of left heel 12/26/2015  . Cervical radiculopathy at C5 12/13/2015  . Low back pain 12/13/2015  . Leg cramps 12/13/2015  . Anxiety 10/23/2015  . Gait instability 10/23/2015  . Edema 10/23/2015  . Encounter for therapeutic drug monitoring 10/30/2014  . Carotid stenosis 09/06/2012  . Metabolic syndrome 26/33/3545  . Lupus anticoagulant with hypercoagulable state ([place]) 06/23/2011  . CVA  (cerebrovascular accident) ([employer]) 03/06/2011  . Hematuria 02/11/2010  . 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 Hyperlipidemia 12/04/2006  . Depression 12/04/2006  . Essential hypertension 12/04/2006    Current Outpatient Medications on File Prior to Visit  Medication Sig Dispense Refill  . ALPRAZolam (XANAX) 0.5 MG tablet TAKE 1/2 TABLET BY MOUTH IN MORNING AND 1 TABLET IN  EVENING 135 tablet 0  . aspirin 81 MG tablet Take 81 mg by mouth daily.      . DULoxetine (CYMBALTA) 30 MG capsule TAKE 1 CAPSULE BY MOUTH TWO TIMES DAILY 180 capsule 1  . famotidine (PEPCID) 20 MG tablet TAKE 1 TABLET BY MOUTH TWO  TIMES DAILY 180 tablet 3  . furosemide (LASIX) 40 MG tablet Take 40 mg twice per day 180 tablet 1  . lisinopril (PRINIVIL,ZESTRIL) 5 MG tablet TAKE 1 TABLET BY MOUTH  DAILY 90 tablet 3  . potassium chloride SA (K-DUR,KLOR-CON) 20 MEQ tablet TAKE 1 TABLET BY MOUTH TWO  TIMES DAILY 180 tablet 3  . simvastatin (ZOCOR) 20 MG tablet TAKE 1 TABLET BY MOUTH AT  BEDTIME 90 tablet 3  . traMADol (ULTRAM) 50 MG tablet Take 50 mg by mouth every 6 (six) hours as needed.    . warfarin (COUMADIN) 5 MG tablet Take 1 tablet daily by mouth or As directed by anticoagulation clinic.  90 day 90 tablet 0  . warfarin (COUMADIN) 5 MG tablet warfarin 5 mg tablet   5 mg by oral route.     No current facility-administered medications on file prior to  visit.     Past Medical History:  Diagnosis Date  . Anxiety   . 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 Depression   . Elevated LFT's   . HA (headache)   . Hyperlipemia   . Hypertension   . Intracranial hemorrhage, spontaneous intraparenchymal, associated with coagulopathy, remote, resolved 06/23/2011  . Lupus anticoagulant disorder ([place])   . Lupus anticoagulant with hypercoagulable state ([place]) 06/23/2011  . Lupus erythematosus 06/23/2011  . Stroke [facility])     Past Surgical History:  Procedure Laterality Date  .  ABDOMINAL HYSTERECTOMY    . TRANSPHENOIDAL PITUITARY RESECTION      Social History   Socioeconomic History  . Marital status: Married    Spouse name: Not on file  . Number of children: 2  . Years of education: Not on file  . Highest education level: Not on file  Occupational History    Employer: UNEMPLOYED  Social Needs  . Financial resource strain: Not on file  . Food insecurity:    Worry: Not on file    Inability: Not on file  . Transportation needs:    Medical: Not on file    Non-medical: Not on file  Tobacco Use  . Smoking status: Never Smoker  . Smokeless tobacco: Never Used  Substance and Sexual Activity  . Alcohol use: No    Alcohol/week: 0.0 standard drinks  . Drug use: No  . Sexual activity: Not on file  Lifestyle  . Physical activity:    Days per week: Not on file    Minutes per session: Not on file  . Stress: Not on file  Relationships  . Social connections:    Talks on phone: Not on file    Gets together: Not on file    Attends religious service: Not on file    Active member of club or organization: Not on file    Attends meetings of clubs or organizations: Not on file    Relationship status: Not on file  Other Topics Concern  . Not on file  Social History Narrative   2 cups of coffee daily   Retired    Family History  Problem Relation Age of Onset  . Diabetes Brother   . Diabetes Sister   . Hypertension Mother   . Dementia Mother   . Bladder Cancer Brother   . Colon cancer Neg Hx     Review of Systems  Constitutional: Negative for fever.  Musculoskeletal: Positive for arthralgias and joint swelling.  Psychiatric/Behavioral: Positive for dysphoric mood. The patient is nervous/anxious.        Objective:   Vitals:   05/14/18 1522  BP: 124/70  Pulse: 72  Resp: 16  Temp: 98.4 F (36.9 C)  SpO2: 98%   BP Readings from Last 3 Encounters:  05/14/18 124/70  04/05/18 119/75  03/11/18 112/74   Wt Readings from Last 3 Encounters:    05/14/18 197 lb (89.4 kg)  04/05/18 194 lb 6.4 oz (88.2 kg)  03/11/18 193 lb (87.5 kg)   Body mass index is 33.81 kg/m.   Physical Exam    Constitutional: Appears well-developed and well-nourished. No distress.  Msk:  Left wrist with mild swelling, numbness, pain with movement Skin: Skin is warm and dry. Not diaphoretic.  Psychiatric: anxious mood and affect. Behavior is normal.      Assessment & Plan:    See Problem List for Assessment and Plan of chronic medical problems.

## 2018-05-14 ENCOUNTER — Encounter: Payer: Self-pay | Admitting: Internal Medicine

## 2018-05-14 ENCOUNTER — Ambulatory Visit (INDEPENDENT_AMBULATORY_CARE_PROVIDER_SITE_OTHER): Payer: Medicare Other | Admitting: Internal Medicine

## 2018-05-14 VITALS — BP 124/70 | HR 72 | Temp 98.4°F | Resp 16 | Ht 64.0 in | Wt 197.0 lb

## 2018-05-14 DIAGNOSIS — F329 Major depressive disorder, single episode, unspecified: Secondary | ICD-10-CM | POA: Diagnosis not present

## 2018-05-14 DIAGNOSIS — F419 Anxiety disorder, unspecified: Secondary | ICD-10-CM

## 2018-05-14 DIAGNOSIS — M25532 Pain in left wrist: Secondary | ICD-10-CM

## 2018-05-14 DIAGNOSIS — F32A Depression, unspecified: Secondary | ICD-10-CM

## 2018-05-14 NOTE — Patient Instructions (Addendum)
A referral was ordered for [facility] Dr Sharlett Iles and for therapy.

## 2018-05-15 NOTE — Assessment & Plan Note (Signed)
Chronic left wrist pain-pain is multifactorial Has complex regional pain syndrome, carpal tunnel syndrome, has had prior surgery Would like to get a second opinion regarding having CTS surgery and other treatment options for chronic pain Referral ordered

## 2018-05-15 NOTE — Assessment & Plan Note (Signed)
Currently taking Cymbalta-we will continue Depression not ideally controlled and would benefit from therapy-she requires a referral-ordered today

## 2018-05-15 NOTE — Assessment & Plan Note (Signed)
Chronic anxiety Taking Cymbalta daily and alprazolam twice daily Not ideally controlled-most of which is related to her chronic medical problems Has agreed to see a therapist-requires referral-ordered

## 2018-05-17 ENCOUNTER — Encounter: Payer: Self-pay | Admitting: Internal Medicine

## 2018-05-19 ENCOUNTER — Ambulatory Visit: Payer: Medicare Other | Admitting: Neurology

## 2018-05-19 DIAGNOSIS — R410 Disorientation, unspecified: Secondary | ICD-10-CM

## 2018-05-19 DIAGNOSIS — R413 Other amnesia: Secondary | ICD-10-CM

## 2018-05-20 ENCOUNTER — Encounter: Payer: Self-pay | Admitting: Internal Medicine

## 2018-05-20 NOTE — Progress Notes (Signed)
Abstracted and sent to scan  

## 2018-05-21 DIAGNOSIS — Z9889 Other specified postprocedural states: Secondary | ICD-10-CM | POA: Diagnosis not present

## 2018-05-21 DIAGNOSIS — M1812 Unilateral primary osteoarthritis of first carpometacarpal joint, left hand: Secondary | ICD-10-CM | POA: Diagnosis not present

## 2018-05-21 DIAGNOSIS — M25532 Pain in left wrist: Secondary | ICD-10-CM | POA: Diagnosis not present

## 2018-05-21 DIAGNOSIS — M19032 Primary osteoarthritis, left wrist: Secondary | ICD-10-CM | POA: Diagnosis not present

## 2018-05-21 DIAGNOSIS — G90512 Complex regional pain syndrome I of left upper limb: Secondary | ICD-10-CM | POA: Diagnosis not present

## 2018-05-22 ENCOUNTER — Other Ambulatory Visit: Payer: Self-pay

## 2018-05-25 ENCOUNTER — Encounter: Payer: Self-pay | Admitting: Internal Medicine

## 2018-05-27 ENCOUNTER — Encounter: Payer: Self-pay | Admitting: Internal Medicine

## 2018-05-29 ENCOUNTER — Ambulatory Visit
Admission: RE | Admit: 2018-05-29 | Discharge: 2018-05-29 | Disposition: A | Discharge status: Home / Self Care | Payer: Medicare Other | Source: Ambulatory Visit | Attending: Neurology | Admitting: Neurology

## 2018-05-29 DIAGNOSIS — R413 Other amnesia: Secondary | ICD-10-CM

## 2018-05-29 MED ORDER — GADOBENATE DIMEGLUMINE 529 MG/ML IV SOLN
18.0000 mL | Freq: Once | INTRAVENOUS | Status: AC | PRN
  Administered 2018-05-29: 18 mL via INTRAVENOUS

## 2018-06-01 ENCOUNTER — Telehealth (INDEPENDENT_AMBULATORY_CARE_PROVIDER_SITE_OTHER): Payer: Self-pay | Admitting: Orthopaedic Surgery

## 2018-06-01 NOTE — Telephone Encounter (Signed)
Received vm from patient requesting copy of her records. Stated she would sign release when she came to pick them up. I have copy ready, however unable to reach pt. When called,automated service stated number was disconnected [phone])

## 2018-06-02 ENCOUNTER — Ambulatory Visit: Payer: Medicare Other | Admitting: Adult Health

## 2018-06-02 DIAGNOSIS — M25532 Pain in left wrist: Secondary | ICD-10-CM | POA: Diagnosis not present

## 2018-06-02 DIAGNOSIS — G90512 Complex regional pain syndrome I of left upper limb: Secondary | ICD-10-CM | POA: Diagnosis not present

## 2018-06-04 ENCOUNTER — Telehealth: Payer: Self-pay

## 2018-06-04 NOTE — Telephone Encounter (Signed)
-----   Message from Garvin Fila, MD sent at 06/03/2018  5:28 PM EST ----- Kindly inform the patient that MRI scan of the brain shows very old left sided strokes with expected changes with age and no new or worrisome findings.  No significant unexpected changes compared with previous MRI from 02/22/2016

## 2018-06-04 NOTE — Telephone Encounter (Signed)
Notes recorded by Marval Regal, RN on 06/04/2018 at 2:21 PM EST Left vm for patient to call on MOnday with MRI results. ------

## 2018-06-07 NOTE — Telephone Encounter (Signed)
Spoke with the patient and she verbalized understanding her results. No other questions or concerns at this time.  

## 2018-06-07 NOTE — Telephone Encounter (Signed)
Pt returned RN's call °

## 2018-06-14 ENCOUNTER — Other Ambulatory Visit: Payer: Self-pay

## 2018-06-14 ENCOUNTER — Encounter: Payer: Self-pay | Admitting: Internal Medicine

## 2018-06-14 MED ORDER — DULOXETINE HCL 30 MG PO CPEP: ORAL_CAPSULE | ORAL | 1 refills | Status: DC

## 2018-06-21 ENCOUNTER — Ambulatory Visit (INDEPENDENT_AMBULATORY_CARE_PROVIDER_SITE_OTHER): Payer: Medicare Other | Admitting: General Practice

## 2018-06-21 DIAGNOSIS — Z7901 Long term (current) use of anticoagulants: Secondary | ICD-10-CM | POA: Diagnosis not present

## 2018-06-21 LAB — POCT INR: INR: 2.1 (ref 2.0–3.0)

## 2018-06-21 NOTE — Patient Instructions (Addendum)
Pre visit review using our clinic review tool, if applicable. No additional management support is needed unless otherwise documented below in the visit note.  Continue to take 5 mg daily.  Re-check in 6 weeks.

## 2018-07-08 ENCOUNTER — Ambulatory Visit: Payer: Medicare Other | Admitting: Psychology

## 2018-07-10 ENCOUNTER — Encounter: Payer: Self-pay | Admitting: Internal Medicine

## 2018-07-12 ENCOUNTER — Encounter: Payer: Self-pay | Admitting: Internal Medicine

## 2018-07-12 MED ORDER — WARFARIN SODIUM 5 MG PO TABS: ORAL_TABLET | ORAL | 0 refills | Status: DC

## 2018-07-12 MED ORDER — LISINOPRIL 5 MG PO TABS: ORAL_TABLET | ORAL | 1 refills | Status: DC

## 2018-07-12 MED ORDER — ALPRAZOLAM 0.5 MG PO TABS: ORAL_TABLET | ORAL | 0 refills | Status: DC

## 2018-07-12 MED ORDER — SIMVASTATIN 20 MG PO TABS: 20.0000 mg | ORAL_TABLET | Freq: Every day | ORAL | 1 refills | Status: DC

## 2018-07-12 NOTE — Telephone Encounter (Signed)
Pls advise on warfarin and check [place] registry last filled alprazolam 03/12/2018

## 2018-07-13 ENCOUNTER — Other Ambulatory Visit: Payer: Self-pay | Admitting: Internal Medicine

## 2018-07-13 MED ORDER — WARFARIN SODIUM 5 MG PO TABS: ORAL_TABLET | ORAL | 0 refills | Status: DC

## 2018-07-13 MED ORDER — ALPRAZOLAM 0.5 MG PO TABS: ORAL_TABLET | ORAL | 0 refills | Status: DC

## 2018-07-13 NOTE — Telephone Encounter (Signed)
Rx pended with [facility] pharmacy. Please advise.

## 2018-07-13 NOTE — Telephone Encounter (Signed)
Copied from [place] [id number]. Topic: Quick Communication - Rx Refill/Question >> Jul 13, 2018  1:03 PM Scherrie Gerlach wrote: Medication: ALPRAZolam Duanne Moron) 0.5 MG tablet warfarin (COUMADIN) 5 MG tablet  Pt states these meds [facility] local [facility].  Pt states this keeps happening and she is not happy about this. Also happens for her husband. Pt wants to know are you able to send Xanax to mailorder? Pt is not going to pick up at [facility], as it cost too [place].  Please cancel these Rx and resend to  [place], [place] [phone] (Phone) [phone] (Fax)  Would like you to send a mychart message when this is done. Please, ALL maintenance meds go to [facility]

## 2018-07-14 ENCOUNTER — Other Ambulatory Visit: Payer: Self-pay

## 2018-07-14 ENCOUNTER — Ambulatory Visit: Payer: Medicare Other | Admitting: Adult Health

## 2018-07-14 ENCOUNTER — Encounter: Payer: Self-pay | Admitting: Adult Health

## 2018-07-14 VITALS — BP 123/72 | HR 60 | Ht 64.0 in | Wt 198.2 lb

## 2018-07-14 DIAGNOSIS — Z87820 Personal history of traumatic brain injury: Secondary | ICD-10-CM | POA: Diagnosis not present

## 2018-07-14 DIAGNOSIS — Z8673 Personal history of transient ischemic attack (TIA), and cerebral infarction without residual deficits: Secondary | ICD-10-CM | POA: Diagnosis not present

## 2018-07-14 DIAGNOSIS — R413 Other amnesia: Secondary | ICD-10-CM
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
## 2018-07-14 NOTE — Patient Instructions (Signed)
Your Plan:  Referral to see a neuro psychiatrist - in the mean time, continue to follow with your established psychologist        Thank you for coming to see [place] at [facility]. I hope we have been able to provide you high quality care today.  You may receive a patient satisfaction survey over the next few weeks. We would appreciate your feedback and comments so that we may continue to improve ourselves and the health of our patients.

## 2018-07-15 NOTE — Progress Notes (Signed)
I agree with the above plan 

## 2018-07-30 ENCOUNTER — Encounter: Payer: Self-pay | Admitting: Internal Medicine

## 2018-08-02 ENCOUNTER — Ambulatory Visit: Payer: Medicare Other

## 2018-08-08 ENCOUNTER — Other Ambulatory Visit: Payer: Self-pay | Admitting: Internal Medicine

## 2018-08-11 DIAGNOSIS — Z79891 Long term (current) use of opiate analgesic: Secondary | ICD-10-CM | POA: Diagnosis not present

## 2018-08-17 ENCOUNTER — Encounter: Payer: Self-pay | Admitting: Internal Medicine

## 2018-08-20 ENCOUNTER — Encounter: Payer: Self-pay | Admitting: Internal Medicine

## 2018-08-24 ENCOUNTER — Encounter: Payer: Self-pay | Admitting: Internal Medicine

## 2018-08-24 ENCOUNTER — Telehealth: Payer: Self-pay | Admitting: Internal Medicine

## 2018-08-24 DIAGNOSIS — R918 Other nonspecific abnormal finding of lung field: Secondary | ICD-10-CM

## 2018-08-24 NOTE — Telephone Encounter (Signed)
Patient is scheduled for a visit on Friday, 08/27/2018 at 3:15pm for a 6 month follow. They are unable to do this as a virtual visit. Can this be done as a phone call to the patient on their home number [phone])? Please advise. Patient was okay with doing this as a phone call.

## 2018-08-24 NOTE — Telephone Encounter (Signed)
Pt would like to reschedule appointment. Did not want to come in the office at the time due to COVID-19.

## 2018-08-24 NOTE — Telephone Encounter (Signed)
Either but will not do a phone call visit

## 2018-08-24 NOTE — Telephone Encounter (Signed)
I know you do not do follow ups as a phone call. Do you want her to come in or reschedule?

## 2018-08-27 ENCOUNTER — Ambulatory Visit: Payer: Self-pay | Admitting: Internal Medicine

## 2018-08-29 ENCOUNTER — Encounter: Payer: Self-pay | Admitting: Internal Medicine

## 2018-09-01 ENCOUNTER — Encounter: Payer: Self-pay | Admitting: Internal Medicine

## 2018-09-01 ENCOUNTER — Institutional Professional Consult (permissible substitution): Payer: Self-pay | Admitting: Internal Medicine

## 2018-09-02 ENCOUNTER — Encounter: Payer: Self-pay | Admitting: Internal Medicine

## 2018-09-28 ENCOUNTER — Encounter: Payer: Self-pay | Admitting: Internal Medicine

## 2018-10-08 ENCOUNTER — Encounter: Payer: Self-pay | Admitting: Internal Medicine

## 2018-10-09 MED ORDER — WARFARIN SODIUM 5 MG PO TABS: ORAL_TABLET | ORAL | 0 refills | Status: DC

## 2018-10-13 ENCOUNTER — Ambulatory Visit (INDEPENDENT_AMBULATORY_CARE_PROVIDER_SITE_OTHER): Payer: Medicare Other | Admitting: General Practice

## 2018-10-13 ENCOUNTER — Other Ambulatory Visit: Payer: Self-pay

## 2018-10-13 DIAGNOSIS — Z7901 Long term (current) use of anticoagulants: Secondary | ICD-10-CM | POA: Diagnosis not present

## 2018-10-13 LAB — POCT INR: INR: 3.1 — AB (ref 2.0–3.0)

## 2018-10-13 NOTE — Progress Notes (Signed)
I have reviewed the results and agree with this plan   

## 2018-10-13 NOTE — Patient Instructions (Addendum)
Pre visit review using our clinic review tool, if applicable. No additional management support is needed unless otherwise documented below in the visit note.  Take 1/2 dose today (6/10) and then continue to take 5 mg daily.  Re-check in 4 weeks. Pt has been using CBD oil for 2 weeks.

## 2018-10-14 ENCOUNTER — Encounter: Payer: Self-pay | Admitting: Internal Medicine

## 2018-11-10 ENCOUNTER — Ambulatory Visit (INDEPENDENT_AMBULATORY_CARE_PROVIDER_SITE_OTHER): Payer: Medicare Other | Admitting: General Practice

## 2018-11-10 ENCOUNTER — Other Ambulatory Visit: Payer: Self-pay

## 2018-11-10 DIAGNOSIS — Z7901 Long term (current) use of anticoagulants: Secondary | ICD-10-CM

## 2018-11-10 LAB — POCT INR: INR: 4.1 — AB (ref 2.0–3.0)

## 2018-11-10 NOTE — Patient Instructions (Signed)
Pre visit review using our clinic review tool, if applicable. No additional management support is needed unless otherwise documented below in the visit note.  Hold dosage today and tomorrow and then change dosage and take 1 tablet daily except 1/2 tablet on Wednesdays.  Re-check in 3 weeks.

## 2018-11-18 ENCOUNTER — Encounter: Payer: Self-pay | Admitting: Internal Medicine

## 2018-11-22 ENCOUNTER — Encounter: Payer: Self-pay | Admitting: Internal Medicine

## 2018-11-25 ENCOUNTER — Encounter: Payer: Self-pay | Admitting: Internal Medicine

## 2018-11-26 ENCOUNTER — Encounter: Payer: Self-pay | Admitting: Internal Medicine

## 2018-11-26 ENCOUNTER — Institutional Professional Consult (permissible substitution): Payer: Self-pay | Admitting: Emergency Medicine

## 2018-11-29 ENCOUNTER — Institutional Professional Consult (permissible substitution): Payer: Medicare Other | Admitting: Pulmonary Disease

## 2018-11-29 ENCOUNTER — Other Ambulatory Visit: Payer: Medicare Other | Admitting: Family Medicine

## 2018-11-29 ENCOUNTER — Ambulatory Visit (INDEPENDENT_AMBULATORY_CARE_PROVIDER_SITE_OTHER): Payer: Medicare Other | Admitting: Family Medicine

## 2018-11-29 ENCOUNTER — Other Ambulatory Visit: Payer: Self-pay

## 2018-11-29 ENCOUNTER — Ambulatory Visit: Payer: Self-pay

## 2018-11-29 VITALS — BP 128/70 | Ht 64.0 in | Wt 185.0 lb

## 2018-11-29 DIAGNOSIS — M25532 Pain in left wrist: Secondary | ICD-10-CM

## 2018-11-29 DIAGNOSIS — G90511 Complex regional pain syndrome I of right upper limb: Secondary | ICD-10-CM | POA: Diagnosis not present

## 2018-11-29 NOTE — Patient Instructions (Signed)
Your ultrasound looks good. Besides some mild changes of your ECU tendon that are likely postsurgical there are no other abnormal findings and no evidence carpal tunnel syndrome. Follow up with your primary care physician. Call us if you have any questions or concerns.

## 2018-11-29 NOTE — Progress Notes (Signed)
Amanda Turner - 73 y.o. female MRN [id number]  Date of birth: August 16, 1945  PCP and consultation requested by Billey Gosling MD  SUBJECTIVE:   CC: left wrist pain  73 yo female with history of complex regional pain syndrome following arthroscopic TFCC debridement and distal ulnar resection in left arm in March 2019 presenting with chronic left wrist and forearm pain and numbness in left hand and forearm. She would like to have ultrasound of hand and wrist today as she was told that she has carpel tunnel syndrome and may benefit from surgery.  Notes swelling on her wrist and forearm that has been present since surgery. Notes numbness in all of her fingers on her left hand, pain that shoots up into her arm. Has been doing OT with minimal improvement.   [facility], on cymbalta, topamax, tramadol, lidocaine ointment.   EMG 05/19/18 of LUE was unremarkable- no evidence of median mononeuropathy. Underwent carpal tunnel injection on 03/04/18 with some benefit.   ROS: No unexpected weight loss, fever, chills, instability, redness + swelling, numbness/tingling, muscle pain in forearm  otherwise see HPI   PMHx - Updated and reviewed. CVA- on warfarin.  PSHx - Updated and reviewed.  Arthroscopic TFCC debridement and distal ulnar resection in March 2019 FHx - Updated and reviewed.  Contributory factors include:  Negative Social Hx - Updated and reviewed. Contributory factors include: Negative Medications - reviewed   DATA REVIEWED: Prior records   PHYSICAL EXAM:  VS: BP:128/70  HR: bpm  TEMP: ( )  RESP:   HT:5\' 4"  (162.6 cm)   WT:185 lb (83.9 kg)  BMI:31.74 PHYSICAL EXAM: Gen: NAD, alert, cooperative with exam, well-appearing HEENT: clear conjunctiva,  CV:  no edema, capillary refill brisk, normal rate Resp: non-labored Skin: no rashes, normal turgor  Neuro: numbness and tingling in arm as noted below Psych:  alert and oriented  Left Hand: Inspection: Swelling  over dorsal surface of forearm, no erythema or bruising Palpation: TTP throughout forearm, hand ROM: Limited flexion of digits- unable to make fist. Limited ROM in wrist- unable to fully supinate. Strength: 4/5 strength in the forearm, wrist and interosseus muscles Neurovascular: reports numbness over dorsum of arm, in dorsal and palmar aspect of hand, fingers  Special tests:positive tinel's at the carpal   Ultrasound of the left wrist: CMC: No arthritic changes or effusion Compartment 1: Normal-appearing abductor pollicis longus and extensor pollicis brevis without tenosynovitis Compartment 2: Extensor carpi radialis longus and brevis are normal in appearance without tenosynovitis.  No inflammatory changes seen at the intersection of compartments 1 and 2. Compartment 3: Extensor pollicis longus appears normal. Compartment 4: Extensor digitorum tendons and extensor indicis appear normal. Of note, compartments 3-4 are grouped together Compartment 5: Extensor digiti minimi I normal appearance without tenosynovitis Compartment 6: Extensor carpi ulnaris with post surgical changes noted within tendon.  No tenosynovitis or apparent subluxation  Distal radius visualized with no abnormalities. Scapho-lunate articulation visualized with normal appearance. Scaphoid body visualized with normal appearance.   Median nerve visualized at carpal tunnel, area measured 0.12 cm. Nerve followed into the forearm with no sign of entrapment throughout its course noted on trans or long views.  Impression: normal wrist and hand ultrasound with post-surgical changes noted within ECU tendon.  Median nerve volume at upper limit of normal but no impingement visualized.   ASSESSMENT & PLAN:   73 yo female with history of complex regional pain syndrome following arthroscopic TFCC debridement and distal ulnar resection in left  arm in March 2019 presenting with chronic pain and paresthesias in hand, wrist, and forearm  consistent with her diagnosis. Exam findings consistent with CRPS. Ultrasound of dorsal and volar aspect of left wrist were normal, no sign of median nerve entrapment. Discussed that carpal tunnel surgery would likely not be beneficial for her as it would likely not improve pain and could worsen CRPS. Will continue with current therapy.

## 2018-11-30 ENCOUNTER — Encounter: Payer: Self-pay | Admitting: Family Medicine

## 2018-11-30 ENCOUNTER — Telehealth: Payer: Self-pay | Admitting: Internal Medicine

## 2018-11-30 NOTE — Progress Notes (Signed)
Subjective:    Patient ID: Amanda Turner, female    DOB: 01-13-1946, 73 y.o.   MRN: [id number]  HPI The patient is here for follow up.  She is not exercising regularly.     Hypertension: She is taking her medication daily. She is compliant with a low sodium diet.  She denies chest pain, palpitations, shortness of breath and regular headaches.  Hyperlipidemia: She is taking her medication daily. She is compliant with a low fat/cholesterol diet. She denies myalgias.   Depression: She is taking her medication daily as prescribed. She denies any side effects from the medication. She feels her depression is well controlled and she is happy with her current dose of medication.   GERD:  She is taking her medication daily as prescribed.  She denies any GERD symptoms and feels her GERD is well controlled.   Anxiety: She is taking her medication daily as prescribed. She denies any side effects from the medication. She feels her anxiety is well controlled and she is happy with her current dose of medication.   Prediabetes:  She is compliant with a low sugar/carbohydrate diet.  She has been taking vitamin Gummies and that does have sugar in it.  She is exercising regularly.  CRPS left wrist, left wrist pain: She saw sports medicine recently and had a localized ultrasound.  She would like to have an MRI.  She continues to have swelling, pain, numbness, burning sensation.  Some of the pain she knows is related to the CRPS, but she is concerned something is wrong with the bone because it is a different type of pain.     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  It was elevated recently related to taking CBD-she understands she cannot take that.  It was still slightly elevated and her dose was just adjusted.   Medications and allergies reviewed with patient and updated if appropriate.  Patient Active Problem List   Diagnosis Date Noted   . Osteopenia 03/07/2018  . Wrist fracture, closed 02/24/2018  . GERD (gastroesophageal reflux disease) 02/24/2018  . 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 Left wrist pain 09/10/2017  . Leg swelling 08/30/2017  . 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 Long term (current) use of anticoagulants 01/14/2017  . Chronic jaw pain 10/27/2016  . Prediabetes 08/06/2016  . Chest pain 05/07/2016  . Subluxation of extensor carpi ulnaris tendon, left, initial encounter 03/24/2016  . Left lumbar radiculopathy 02/12/2016  . Post-concussion headache 01/25/2016  . Achilles tendinosis 01/17/2016  . Pain in left wrist 01/17/2016  . Pain of left heel 12/26/2015  . Cervical radiculopathy at C5 12/13/2015  . Low back pain 12/13/2015  . Leg cramps 12/13/2015  . Anxiety 10/23/2015  . Gait instability 10/23/2015  . Edema 10/23/2015  . Encounter for therapeutic drug monitoring 10/30/2014  . Carotid stenosis 09/06/2012  . Metabolic syndrome 65/46/5035  . Lupus anticoagulant with hypercoagulable state ([place]) 06/23/2011  . CVA (cerebrovascular accident) ([place]) 03/06/2011  . Hematuria 02/11/2010  . 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 Hyperlipidemia 12/04/2006  . Depression 12/04/2006  . Essential hypertension 12/04/2006    Current Outpatient Medications on File Prior to Visit  Medication Sig Dispense Refill  . ALPRAZolam (XANAX) 0.5 MG tablet TAKE 1/2 TABLET BY MOUTH IN MORNING AND 1 TABLET IN  EVENING 135 tablet 0  . aspirin 81 MG tablet Take 81 mg by mouth  daily.      . Aspirin Buf,CaCarb-MgCarb-MgO, 81 MG TABS aspirin 81 mg tablet   81 mg by oral route.    . DULoxetine (CYMBALTA) 30 MG capsule TAKE 1 CAPSULE BY MOUTH TWO TIMES DAILY 180 capsule 1  . famotidine (PEPCID) 20 MG tablet TAKE 1 TABLET BY MOUTH TWO  TIMES DAILY 180 tablet 3  . furosemide (LASIX) 40 MG tablet TAKE 1 TABLET BY MOUTH TWO  TIMES DAILY 180 tablet 1  . lisinopril (PRINIVIL,ZESTRIL) 5 MG tablet  TAKE 1 TABLET BY MOUTH  DAILY 90 tablet 1  . potassium chloride SA (K-DUR,KLOR-CON) 20 MEQ tablet TAKE 1 TABLET BY MOUTH TWO  TIMES DAILY 180 tablet 3  . simvastatin (ZOCOR) 20 MG tablet Take 1 tablet (20 mg total) by mouth at bedtime. 90 tablet 1  . topiramate (TOPAMAX) 50 MG tablet Take 50 mg by mouth 2 (two) times daily.    . traMADol (ULTRAM) 50 MG tablet Take 50 mg by mouth every 6 (six) hours as needed.    . warfarin (COUMADIN) 5 MG tablet TAKE 1 TABLET BY MOUTH  DAILY OR AS DIRECTED BY  ANTICOAGULATION CLINIC. 90 tablet 0   No current facility-administered medications on file prior to visit.     Past Medical History:  Diagnosis Date  . Anxiety   . 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 Depression   . Elevated LFT's   . HA (headache)   . Hyperlipemia   . Hypertension   . Intracranial hemorrhage, spontaneous intraparenchymal, associated with coagulopathy, remote, resolved 06/23/2011  . Lupus anticoagulant disorder ([place])   . Lupus anticoagulant with hypercoagulable state ([place]) 06/23/2011  . Lupus erythematosus 06/23/2011  . Stroke [facility])     Past Surgical History:  Procedure Laterality Date  . ABDOMINAL HYSTERECTOMY    . TRANSPHENOIDAL PITUITARY RESECTION      Social History   Socioeconomic History  . Marital status: Married    Spouse name: Not on file  . Number of children: 2  . Years of education: Not on file  . Highest education level: Not on file  Occupational History    Employer: UNEMPLOYED  Social Needs  . Financial resource strain: Not on file  . Food insecurity    Worry: Not on file    Inability: Not on file  . Transportation needs    Medical: Not on file    Non-medical: Not on file  Tobacco Use  . Smoking status: Never Smoker  . Smokeless tobacco: Never Used  Substance and Sexual Activity  . Alcohol use: No    Alcohol/week: 0.0 standard drinks  . Drug use: No  . Sexual activity: Not on file  Lifestyle  .  Physical activity    Days per week: Not on file    Minutes per session: Not on file  . Stress: Not on file  Relationships  . Social Herbalist on phone: Not on file    Gets together: Not on file    Attends religious service: Not on file    Active member of club or organization: Not on file    Attends meetings of clubs or organizations: Not on file    Relationship status: Not on file  Other Topics Concern  . Not on file  Social History Narrative   2 cups of coffee daily   Retired    Family History  Problem Relation Age of Onset  . Diabetes Brother   .  Diabetes Sister   . Hypertension Mother   . Dementia Mother   . Bladder Cancer Brother   . Colon cancer Neg Hx     Review of Systems  Constitutional: Negative for chills and fever.  Respiratory: Negative for cough, shortness of breath and wheezing.   Cardiovascular: Positive for leg swelling (with heat). Negative for chest pain and palpitations.  Genitourinary: Positive for dysuria.  Neurological: Negative for light-headedness and headaches.       Objective:   Vitals:   12/01/18 1529  BP: 130/82  Pulse: 71  Resp: 16  Temp: 98.6 F (37 C)  SpO2: 96%   BP Readings from Last 3 Encounters:  12/01/18 130/82  11/29/18 128/70  07/14/18 123/72   Wt Readings from Last 3 Encounters:  12/01/18 199 lb (90.3 kg)  11/29/18 185 lb (83.9 kg)  07/14/18 198 lb 3.2 oz (89.9 kg)   Body mass index is 34.16 kg/m.   Physical Exam    Constitutional: Appears well-developed and well-nourished. No distress.  HENT:  Head: Normocephalic and atraumatic.  Neck: Neck supple. No tracheal deviation present. No thyromegaly present.  No cervical lymphadenopathy Cardiovascular: Normal rate, regular rhythm and normal heart sounds.   No murmur heard. No carotid bruit .  Trace bilateral lower extremity edema Pulmonary/Chest: Effort normal and breath sounds normal. No respiratory distress. No has no wheezes. No rales.  Skin: Skin  is warm and dry. Not diaphoretic.  Psychiatric: Normal mood and affect. Behavior is normal.      Assessment & Plan:    See Problem List for Assessment and Plan of chronic medical problems.

## 2018-11-30 NOTE — Telephone Encounter (Signed)
There is already blood work in from October that pt did not get done. Orders are not expired.

## 2018-11-30 NOTE — Patient Instructions (Addendum)
  Tests ordered today. Your results will be released to [place] (or called to you) after review.  If any changes need to be made, you will be notified at that same time.    Medications reviewed and updated.  Changes include :   none  Your prescription(s) have been submitted to your pharmacy. Please take as directed and contact our office if you believe you are having problem(s) with the medication(s).  A referral was ordered for ortho and pain management.  Please followup in 6 months

## 2018-11-30 NOTE — Telephone Encounter (Signed)
Pt needs blood work ordered for appt tomorrow Please advise

## 2018-12-01 ENCOUNTER — Ambulatory Visit (INDEPENDENT_AMBULATORY_CARE_PROVIDER_SITE_OTHER): Payer: Medicare Other | Admitting: Internal Medicine

## 2018-12-01 ENCOUNTER — Ambulatory Visit: Payer: Medicare Other

## 2018-12-01 ENCOUNTER — Other Ambulatory Visit: Payer: Self-pay | Admitting: Internal Medicine

## 2018-12-01 ENCOUNTER — Ambulatory Visit (INDEPENDENT_AMBULATORY_CARE_PROVIDER_SITE_OTHER): Payer: Medicare Other | Admitting: General Practice

## 2018-12-01 ENCOUNTER — Other Ambulatory Visit: Payer: Self-pay

## 2018-12-01 ENCOUNTER — Other Ambulatory Visit (INDEPENDENT_AMBULATORY_CARE_PROVIDER_SITE_OTHER): Payer: Medicare Other

## 2018-12-01 ENCOUNTER — Encounter: Payer: Self-pay | Admitting: Internal Medicine

## 2018-12-01 VITALS — BP 130/82 | HR 71 | Temp 98.6°F | Resp 16 | Ht 64.0 in | Wt 199.0 lb

## 2018-12-01 DIAGNOSIS — S62102S Fracture of unspecified carpal bone, left wrist, sequela: Secondary | ICD-10-CM | POA: Diagnosis not present

## 2018-12-01 DIAGNOSIS — R7303 Prediabetes: Secondary | ICD-10-CM

## 2018-12-01 DIAGNOSIS — E782 Mixed hyperlipidemia: Secondary | ICD-10-CM

## 2018-12-01 DIAGNOSIS — K219 Gastro-esophageal reflux disease without esophagitis: Secondary | ICD-10-CM | POA: Diagnosis not present

## 2018-12-01 DIAGNOSIS — R300 Dysuria: Secondary | ICD-10-CM | POA: Diagnosis not present

## 2018-12-01 DIAGNOSIS — F419 Anxiety disorder, unspecified: Secondary | ICD-10-CM

## 2018-12-01 DIAGNOSIS — I10 Essential (primary) hypertension: Secondary | ICD-10-CM

## 2018-12-01 DIAGNOSIS — Z7901 Long term (current) use of anticoagulants: Secondary | ICD-10-CM

## 2018-12-01 DIAGNOSIS — G8929 Other chronic pain: Secondary | ICD-10-CM

## 2018-12-01 DIAGNOSIS — R600 Localized edema: Secondary | ICD-10-CM

## 2018-12-01 DIAGNOSIS — F329 Major depressive disorder, single episode, unspecified: Secondary | ICD-10-CM | POA: Diagnosis not present

## 2018-12-01 DIAGNOSIS — G90512 Complex regional pain syndrome I of left upper limb: Secondary | ICD-10-CM

## 2018-12-01 DIAGNOSIS — M25532 Pain in left wrist: Secondary | ICD-10-CM

## 2018-12-01 DIAGNOSIS — F32A Depression, unspecified: Secondary | ICD-10-CM

## 2018-12-01 DIAGNOSIS — D6862 Lupus anticoagulant syndrome: Secondary | ICD-10-CM

## 2018-12-01 LAB — CBC WITH DIFFERENTIAL/PLATELET
Basophils Absolute: 0.1 10*3/uL (ref 0.0–0.1)
Basophils Relative: 1.5 % (ref 0.0–3.0)
Eosinophils Absolute: 0.1 10*3/uL (ref 0.0–0.7)
Eosinophils Relative: 1.8 % (ref 0.0–5.0)
HCT: 45.7 % (ref 36.0–46.0)
Hemoglobin: 15.5 g/dL — ABNORMAL HIGH (ref 12.0–15.0)
Lymphocytes Relative: 32.6 % (ref 12.0–46.0)
Lymphs Abs: 2.4 10*3/uL (ref 0.7–4.0)
MCHC: 33.9 g/dL (ref 30.0–36.0)
MCV: 94.8 fl (ref 78.0–100.0)
Monocytes Absolute: 0.8 10*3/uL (ref 0.1–1.0)
Monocytes Relative: 10.2 % (ref 3.0–12.0)
Neutro Abs: 3.9 10*3/uL (ref 1.4–7.7)
Neutrophils Relative %: 53.9 % (ref 43.0–77.0)
Platelets: 266 10*3/uL (ref 150.0–400.0)
RBC: 4.82 Mil/uL (ref 3.87–5.11)
RDW: 12.7 % (ref 11.5–15.5)
WBC: 7.3 10*3/uL (ref 4.0–10.5)

## 2018-12-01 LAB — POCT INR: INR: 3.4 — AB (ref 2.0–3.0)

## 2018-12-01 NOTE — Assessment & Plan Note (Signed)
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 Denice Paradise will place referral

## 2018-12-01 NOTE — Assessment & Plan Note (Signed)
Chronic Overall controlled with Lasix
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
## 2018-12-01 NOTE — Assessment & Plan Note (Signed)
BP well controlled Current regimen effective and well tolerated Continue current medications at current doses CMP 

## 2018-12-01 NOTE — Assessment & Plan Note (Signed)
Anxiety controlled Continue Cymbalta and alprazolam at current doses

## 2018-12-01 NOTE — Patient Instructions (Addendum)
Pre visit review using our clinic review tool, if applicable. No additional management support is needed unless otherwise documented below in the visit note.  Hold dosage today and then change dosage and take 1 tablet daily except 1/2 tablet on Wednesdays and Saturdays.  Re-check in 3 weeks.

## 2018-12-01 NOTE — Assessment & Plan Note (Signed)
Controlled, stable Continue current dose of medication  

## 2018-12-01 NOTE — Assessment & Plan Note (Signed)
Check a1c Low sugar / carb diet Stressed regular exercise   

## 2018-12-01 NOTE — Assessment & Plan Note (Signed)
GERD controlled Continue daily medication  

## 2018-12-01 NOTE — Assessment & Plan Note (Signed)
Check lipid panel  Continue daily statin Regular exercise and healthy diet encouraged  

## 2018-12-01 NOTE — Assessment & Plan Note (Signed)
UA, urine culture

## 2018-12-02 ENCOUNTER — Encounter: Payer: Self-pay | Admitting: Internal Medicine

## 2018-12-02 LAB — LIPID PANEL
Cholesterol: 241 mg/dL — ABNORMAL HIGH (ref 0–200)
HDL: 74.7 mg/dL (ref >39.00)
LDL Cholesterol: 128 mg/dL — ABNORMAL HIGH (ref 0–99)
NonHDL: 166.79
Total CHOL/HDL Ratio: 3
Triglycerides: 195 mg/dL — ABNORMAL HIGH (ref 0.0–149.0)
VLDL: 39 mg/dL (ref 0.0–40.0)

## 2018-12-02 LAB — HEMOGLOBIN A1C: Hgb A1c MFr Bld: 5.8 % (ref 4.6–6.5)

## 2018-12-02 LAB — URINALYSIS, ROUTINE W REFLEX MICROSCOPIC
Bilirubin Urine: NEGATIVE
Ketones, ur: NEGATIVE
Leukocytes,Ua: NEGATIVE
Nitrite: NEGATIVE
Specific Gravity, Urine: 1.02 (ref 1.000–1.030)
Total Protein, Urine: NEGATIVE
Urine Glucose: NEGATIVE
Urobilinogen, UA: 0.2 (ref 0.0–1.0)
pH: 7 (ref 5.0–8.0)

## 2018-12-02 LAB — COMPREHENSIVE METABOLIC PANEL
ALT: 30 U/L (ref 0–35)
AST: 23 U/L (ref 0–37)
Albumin: 4.4 g/dL (ref 3.5–5.2)
Alkaline Phosphatase: 90 U/L (ref 39–117)
BUN: 14 mg/dL (ref 6–23)
CO2: 30 mEq/L (ref 19–32)
Calcium: 9.2 mg/dL (ref 8.4–10.5)
Chloride: 103 mEq/L (ref 96–112)
Creatinine, Ser: 0.87 mg/dL (ref 0.40–1.20)
GFR: 63.89 mL/min (ref >60.00)
Glucose, Bld: 107 mg/dL — ABNORMAL HIGH (ref 70–99)
Potassium: 3.6 mEq/L (ref 3.5–5.1)
Sodium: 143 mEq/L (ref 135–145)
Total Bilirubin: 0.4 mg/dL (ref 0.2–1.2)
Total Protein: 7.7 g/dL (ref 6.0–8.3)

## 2018-12-02 LAB — VITAMIN D 25 HYDROXY (VIT D DEFICIENCY, FRACTURES): VITD: 29.97 ng/mL — ABNORMAL LOW (ref 30.00–100.00)

## 2018-12-03 ENCOUNTER — Ambulatory Visit: Payer: Medicare Other | Admitting: Internal Medicine

## 2018-12-03 LAB — URINE CULTURE
MICRO NUMBER:: 716144
Result:: NO GROWTH
SPECIMEN QUALITY:: ADEQUATE

## 2018-12-07 DIAGNOSIS — M25532 Pain in left wrist: Secondary | ICD-10-CM | POA: Diagnosis not present

## 2018-12-08 ENCOUNTER — Other Ambulatory Visit: Payer: Medicare Other | Admitting: Family Medicine

## 2018-12-10 ENCOUNTER — Telehealth: Payer: Self-pay | Admitting: Internal Medicine

## 2018-12-10 NOTE — Telephone Encounter (Signed)
Rec'd from [facility] forwarded 2 pages to Dr. Celso Amy

## 2018-12-15 ENCOUNTER — Other Ambulatory Visit: Payer: Self-pay | Admitting: Internal Medicine

## 2018-12-15 DIAGNOSIS — M25532 Pain in left wrist: Secondary | ICD-10-CM | POA: Diagnosis not present

## 2018-12-16 DIAGNOSIS — M25532 Pain in left wrist: Secondary | ICD-10-CM | POA: Diagnosis not present

## 2018-12-16 NOTE — Telephone Encounter (Signed)
Check  registry last filled Alprazolam 08/10/2018.Marland KitchenJohny Chess

## 2018-12-21 ENCOUNTER — Telehealth: Payer: Self-pay | Admitting: Internal Medicine

## 2018-12-21 NOTE — Telephone Encounter (Signed)
Recv'd medical records from [facility] forwarded 2 pages to Dr. Billey Gosling 8/13/20fbg.

## 2018-12-22 ENCOUNTER — Ambulatory Visit (INDEPENDENT_AMBULATORY_CARE_PROVIDER_SITE_OTHER): Payer: Medicare Other | Admitting: General Practice

## 2018-12-22 ENCOUNTER — Other Ambulatory Visit: Payer: Self-pay

## 2018-12-22 DIAGNOSIS — Z7901 Long term (current) use of anticoagulants: Secondary | ICD-10-CM | POA: Diagnosis not present

## 2018-12-22 LAB — POCT INR: INR: 2.4 (ref 2.0–3.0)

## 2018-12-22 NOTE — Progress Notes (Signed)
I have reviewed the results and agree with this plan   

## 2018-12-22 NOTE — Patient Instructions (Signed)
Pre visit review using our clinic review tool, if applicable. No additional management support is needed unless otherwise documented below in the visit note.  Continue to take 1 tablet daily except 1/2 tablet on Wednesdays and Saturdays.  Re-check in 4 weeks.

## 2018-12-30 ENCOUNTER — Telehealth: Payer: Self-pay | Admitting: Adult Health

## 2018-12-30 NOTE — Telephone Encounter (Signed)
°  Amanda Turner, Amanda Turner        We have been unable to reach patient to schedule an appt with Dr. Sima Matas. We will be closing referral at this time.     I called Patient and her husband and left a message as well relayed she needed to call them to schedule.

## 2019-01-14 ENCOUNTER — Other Ambulatory Visit: Payer: Self-pay | Admitting: Internal Medicine

## 2019-01-19 ENCOUNTER — Other Ambulatory Visit: Payer: Self-pay

## 2019-01-19 ENCOUNTER — Ambulatory Visit (INDEPENDENT_AMBULATORY_CARE_PROVIDER_SITE_OTHER): Payer: Medicare Other | Admitting: General Practice

## 2019-01-19 DIAGNOSIS — Z7901 Long term (current) use of anticoagulants: Secondary | ICD-10-CM

## 2019-01-19 LAB — POCT INR: INR: 5 — AB (ref 2.0–3.0)

## 2019-01-19 NOTE — Patient Instructions (Addendum)
Pre visit review using our clinic review tool, if applicable. No additional management support is needed unless otherwise documented below in the visit note.  Hold coumadin through Friday.  On Saturday continue to take 1 tablet daily except 1/2 tablet on Wednesdays and Saturdays.  Re-check in 2 weeks.

## 2019-01-19 NOTE — Progress Notes (Signed)
I have reviewed the results and agree with this plan   

## 2019-02-02 ENCOUNTER — Ambulatory Visit (INDEPENDENT_AMBULATORY_CARE_PROVIDER_SITE_OTHER): Payer: Medicare Other | Admitting: General Practice

## 2019-02-02 ENCOUNTER — Other Ambulatory Visit: Payer: Self-pay

## 2019-02-02 DIAGNOSIS — Z7901 Long term (current) use of anticoagulants: Secondary | ICD-10-CM

## 2019-02-02 LAB — POCT INR: INR: 3.5 — AB (ref 2.0–3.0)

## 2019-02-02 NOTE — Patient Instructions (Addendum)
Pre visit review using our clinic review tool, if applicable. No additional management support is needed unless otherwise documented below in the visit note.  Hold coumadin today and then change dosage and take 1 tablet daily except 1/2 tablet on Mondays Wednesdays and Saturdays.  Re-check in 3 weeks.

## 2019-02-02 NOTE — Progress Notes (Signed)
I have reviewed the results and agree with this plan   

## 2019-02-15 ENCOUNTER — Encounter: Payer: Self-pay | Admitting: Internal Medicine

## 2019-02-17 ENCOUNTER — Other Ambulatory Visit: Payer: Self-pay

## 2019-02-17 ENCOUNTER — Ambulatory Visit (INDEPENDENT_AMBULATORY_CARE_PROVIDER_SITE_OTHER): Payer: Medicare Other

## 2019-02-17 DIAGNOSIS — Z23 Encounter for immunization: Secondary | ICD-10-CM | POA: Diagnosis not present

## 2019-02-23 ENCOUNTER — Ambulatory Visit (INDEPENDENT_AMBULATORY_CARE_PROVIDER_SITE_OTHER): Payer: Medicare Other | Admitting: General Practice

## 2019-02-23 ENCOUNTER — Other Ambulatory Visit: Payer: Self-pay

## 2019-02-23 DIAGNOSIS — Z7901 Long term (current) use of anticoagulants: Secondary | ICD-10-CM | POA: Diagnosis not present

## 2019-02-23 LAB — POCT INR: INR: 2.3 (ref 2.0–3.0)

## 2019-02-23 NOTE — Patient Instructions (Addendum)
Pre visit review using our clinic review tool, if applicable. No additional management support is needed unless otherwise documented below in the visit note.   Continue to take 1 tablet daily except 1/2 tablet on Mondays Wednesdays and Saturdays.  Re-check in 4 weeks.

## 2019-03-01 ENCOUNTER — Other Ambulatory Visit: Payer: Self-pay

## 2019-03-01 ENCOUNTER — Encounter: Payer: Self-pay | Admitting: Internal Medicine

## 2019-03-01 MED ORDER — DULOXETINE HCL 30 MG PO CPEP: ORAL_CAPSULE | ORAL | 1 refills | Status: DC

## 2019-03-22 DIAGNOSIS — H02831 Dermatochalasis of right upper eyelid: Secondary | ICD-10-CM | POA: Diagnosis not present

## 2019-03-22 DIAGNOSIS — H02834 Dermatochalasis of left upper eyelid: Secondary | ICD-10-CM | POA: Diagnosis not present

## 2019-03-22 DIAGNOSIS — H5203 Hypermetropia, bilateral: Secondary | ICD-10-CM | POA: Diagnosis not present

## 2019-03-22 DIAGNOSIS — H2513 Age-related nuclear cataract, bilateral: Secondary | ICD-10-CM | POA: Diagnosis not present

## 2019-03-23 ENCOUNTER — Ambulatory Visit (INDEPENDENT_AMBULATORY_CARE_PROVIDER_SITE_OTHER): Payer: Medicare Other | Admitting: General Practice

## 2019-03-23 ENCOUNTER — Other Ambulatory Visit: Payer: Self-pay

## 2019-03-23 DIAGNOSIS — Z7901 Long term (current) use of anticoagulants: Secondary | ICD-10-CM

## 2019-03-23 LAB — POCT INR: INR: 2.1 (ref 2.0–3.0)

## 2019-03-23 NOTE — Patient Instructions (Addendum)
Pre visit review using our clinic review tool, if applicable. No additional management support is needed unless otherwise documented below in the visit note.  Continue to take 1 tablet daily except 1/2 tablet on Mondays Wednesdays and Saturdays.  Re-check in 4 weeks.

## 2019-03-23 NOTE — Progress Notes (Signed)
I have reviewed the results and agree with this plan   

## 2019-04-02 ENCOUNTER — Other Ambulatory Visit: Payer: Self-pay | Admitting: Internal Medicine

## 2019-04-04 NOTE — Telephone Encounter (Signed)
[place] Controlled Database Checked Last filled: 12/17/18 # 135 LOV w/you: 12/01/18 Next appt w/you: 06/06/19

## 2019-04-05 ENCOUNTER — Encounter: Payer: Self-pay | Admitting: Internal Medicine

## 2019-04-20 ENCOUNTER — Ambulatory Visit (INDEPENDENT_AMBULATORY_CARE_PROVIDER_SITE_OTHER): Payer: Medicare Other | Admitting: General Practice

## 2019-04-20 ENCOUNTER — Other Ambulatory Visit: Payer: Self-pay

## 2019-04-20 DIAGNOSIS — Z7901 Long term (current) use of anticoagulants: Secondary | ICD-10-CM | POA: Diagnosis not present

## 2019-04-20 LAB — POCT INR: INR: 2 (ref 2.0–3.0)

## 2019-04-20 NOTE — Patient Instructions (Signed)
Pre visit review using our clinic review tool, if applicable. No additional management support is needed unless otherwise documented below in the visit note.  Continue to take 1 tablet daily except 1/2 tablet on Mondays Wednesdays and Saturdays.  Re-check in 6 weeks.

## 2019-04-20 NOTE — Progress Notes (Signed)
I have reviewed the results and agree with this plan   

## 2019-05-10 DIAGNOSIS — G894 Chronic pain syndrome: Secondary | ICD-10-CM | POA: Diagnosis not present

## 2019-05-10 DIAGNOSIS — Z79891 Long term (current) use of opiate analgesic: Secondary | ICD-10-CM | POA: Diagnosis not present

## 2019-05-13 ENCOUNTER — Encounter: Payer: Self-pay | Admitting: Internal Medicine

## 2019-06-01 ENCOUNTER — Other Ambulatory Visit: Payer: Self-pay

## 2019-06-01 ENCOUNTER — Telehealth: Payer: Self-pay | Admitting: Internal Medicine

## 2019-06-01 ENCOUNTER — Ambulatory Visit (INDEPENDENT_AMBULATORY_CARE_PROVIDER_SITE_OTHER): Payer: Medicare Other | Admitting: General Practice

## 2019-06-01 DIAGNOSIS — Z7901 Long term (current) use of anticoagulants: Secondary | ICD-10-CM | POA: Diagnosis not present

## 2019-06-01 DIAGNOSIS — E782 Mixed hyperlipidemia: Secondary | ICD-10-CM

## 2019-06-01 DIAGNOSIS — R7303 Prediabetes: Secondary | ICD-10-CM

## 2019-06-01 DIAGNOSIS — I10 Essential (primary) hypertension: Secondary | ICD-10-CM

## 2019-06-01 DIAGNOSIS — E559 Vitamin D deficiency, unspecified: Secondary | ICD-10-CM

## 2019-06-01 LAB — POCT INR: INR: 1.8 — AB (ref 2.0–3.0)

## 2019-06-01 NOTE — Telephone Encounter (Signed)
Would like to know if she could have labs entered before her appt on Monday?

## 2019-06-01 NOTE — Patient Instructions (Addendum)
Pre visit review using our clinic review tool, if applicable. No additional management support is needed unless otherwise documented below in the visit note.  Increase dose today to 5mg  then continue to take 1 tablet daily except 1/2 tablet on Mondays Wednesdays and Saturdays.  Re-check in 4 weeks.

## 2019-06-01 NOTE — Telephone Encounter (Signed)
LVM for pt to let her know labs were put in and she needs an appointment to have them done at the new location. Did leave the option of going to [facility] to have labs checked as well.

## 2019-06-01 NOTE — Telephone Encounter (Signed)
Blood work ordered-needs to be scheduled at lab

## 2019-06-02 ENCOUNTER — Other Ambulatory Visit (INDEPENDENT_AMBULATORY_CARE_PROVIDER_SITE_OTHER): Payer: Medicare Other

## 2019-06-02 ENCOUNTER — Other Ambulatory Visit: Payer: Self-pay | Admitting: Internal Medicine

## 2019-06-02 DIAGNOSIS — R7303 Prediabetes: Secondary | ICD-10-CM

## 2019-06-02 DIAGNOSIS — I10 Essential (primary) hypertension: Secondary | ICD-10-CM

## 2019-06-02 DIAGNOSIS — E559 Vitamin D deficiency, unspecified: Secondary | ICD-10-CM

## 2019-06-02 DIAGNOSIS — E782 Mixed hyperlipidemia: Secondary | ICD-10-CM

## 2019-06-02 LAB — CBC WITH DIFFERENTIAL/PLATELET
Basophils Absolute: 0.1 10*3/uL (ref 0.0–0.1)
Basophils Relative: 1.7 % (ref 0.0–3.0)
Eosinophils Absolute: 0.1 10*3/uL (ref 0.0–0.7)
Eosinophils Relative: 2 % (ref 0.0–5.0)
HCT: 44.1 % (ref 36.0–46.0)
Hemoglobin: 15.1 g/dL — ABNORMAL HIGH (ref 12.0–15.0)
Lymphocytes Relative: 24 % (ref 12.0–46.0)
Lymphs Abs: 1.3 10*3/uL (ref 0.7–4.0)
MCHC: 34.1 g/dL (ref 30.0–36.0)
MCV: 93.1 fl (ref 78.0–100.0)
Monocytes Absolute: 0.5 10*3/uL (ref 0.1–1.0)
Monocytes Relative: 10.1 % (ref 3.0–12.0)
Neutro Abs: 3.4 10*3/uL (ref 1.4–7.7)
Neutrophils Relative %: 62.2 % (ref 43.0–77.0)
Platelets: 249 10*3/uL (ref 150.0–400.0)
RBC: 4.74 Mil/uL (ref 3.87–5.11)
RDW: 12.6 % (ref 11.5–15.5)
WBC: 5.4 10*3/uL (ref 4.0–10.5)

## 2019-06-02 LAB — COMPREHENSIVE METABOLIC PANEL
ALT: 30 U/L (ref 0–35)
AST: 19 U/L (ref 0–37)
Albumin: 4.2 g/dL (ref 3.5–5.2)
Alkaline Phosphatase: 86 U/L (ref 39–117)
BUN: 18 mg/dL (ref 6–23)
CO2: 28 mEq/L (ref 19–32)
Calcium: 9.3 mg/dL (ref 8.4–10.5)
Chloride: 106 mEq/L (ref 96–112)
Creatinine, Ser: 0.85 mg/dL (ref 0.40–1.20)
GFR: 65.54 mL/min (ref >60.00)
Glucose, Bld: 129 mg/dL — ABNORMAL HIGH (ref 70–99)
Potassium: 3.5 mEq/L (ref 3.5–5.1)
Sodium: 142 mEq/L (ref 135–145)
Total Bilirubin: 0.6 mg/dL (ref 0.2–1.2)
Total Protein: 7.2 g/dL (ref 6.0–8.3)

## 2019-06-02 LAB — LIPID PANEL
Cholesterol: 209 mg/dL — ABNORMAL HIGH (ref 0–200)
HDL: 78.8 mg/dL (ref >39.00)
LDL Cholesterol: 103 mg/dL — ABNORMAL HIGH (ref 0–99)
NonHDL: 130.31
Total CHOL/HDL Ratio: 3
Triglycerides: 137 mg/dL (ref 0.0–149.0)
VLDL: 27.4 mg/dL (ref 0.0–40.0)

## 2019-06-02 LAB — VITAMIN D 25 HYDROXY (VIT D DEFICIENCY, FRACTURES): VITD: 26.21 ng/mL — ABNORMAL LOW (ref 30.00–100.00)

## 2019-06-02 LAB — HEMOGLOBIN A1C: Hgb A1c MFr Bld: 5.9 % (ref 4.6–6.5)

## 2019-06-04 ENCOUNTER — Other Ambulatory Visit: Payer: Self-pay | Admitting: Internal Medicine

## 2019-06-05 NOTE — Progress Notes (Signed)
Subjective:    Patient ID: Amanda Turner, female    DOB: 01/29/1946, 74 y.o.   MRN: [id number]  HPI The patient is here for follow up of their chronic medical problems, including hypertension, hyperlipidemia, gerd, anxiety, depression, prediabetes, lupus anticoagulant on warfarin with h/o cva.   She is taking all of her medications as prescribed.   She is not exercising regularly.     She has some depression.  She has anxiety. She is taking he medications as prescribed.   She is currently following with pain management.  She is taking 1 tramadol a day and she thinks it helps.  She is also taking the Cymbalta, which she thinks helps.  She knows there is not much else the pain management can do for her and she wondered if I could prescribe the tramadol.  Medications and allergies reviewed with patient and updated if appropriate.  Patient Active Problem List   Diagnosis Date Noted  . Vitamin D deficiency 06/01/2019  . Dysuria 12/01/2018  . Osteopenia 03/07/2018  . Wrist fracture, closed 02/24/2018  . GERD (gastroesophageal reflux disease) 02/24/2018  . 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 Chronic wrist pain, left 09/10/2017  . Leg swelling 08/30/2017  . 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 Long term (current) use of anticoagulants 01/14/2017  . Chronic jaw pain 10/27/2016  . Prediabetes 08/06/2016  . Subluxation of extensor carpi ulnaris tendon, left, initial encounter 03/24/2016  . Left lumbar radiculopathy 02/12/2016  . Achilles tendinosis 01/17/2016  . Cervical radiculopathy at C5 12/13/2015  . Low back pain 12/13/2015  . Leg cramps 12/13/2015  . Anxiety 10/23/2015  . Gait instability 10/23/2015  . Edema 10/23/2015  . Encounter for therapeutic drug monitoring 10/30/2014  . Carotid stenosis 09/06/2012  . Lupus anticoagulant with hypercoagulable state ([place]) 06/23/2011  . H/O: CVA (cerebrovascular accident) 03/06/2011  . Hematuria  02/11/2010  . 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 Hyperlipidemia 12/04/2006  . Depression 12/04/2006  . Essential hypertension 12/04/2006    Current Outpatient Medications on File Prior to Visit  Medication Sig Dispense Refill  . ALPRAZolam (XANAX) 0.5 MG tablet TAKE 1/2 TABLET BY MOUTH IN THE MORNING AND 1 TABLET BY MOUTH IN THE EVENING 135 tablet 0  . aspirin 81 MG tablet Take 81 mg by mouth daily.      . Aspirin Buf,CaCarb-MgCarb-MgO, 81 MG TABS aspirin 81 mg tablet   81 mg by oral route.    . DULoxetine (CYMBALTA) 30 MG capsule TAKE 1 CAPSULE BY MOUTH TWO TIMES DAILY 180 capsule 1  . famotidine (PEPCID) 20 MG tablet Take 1 tablet (20 mg total) by mouth 2 (two) times daily. Annual appt due in July must see provider for future refills 180 tablet 1  . furosemide (LASIX) 40 MG tablet TAKE 1 TABLET BY MOUTH  TWICE DAILY 180 tablet 3  . lisinopril (ZESTRIL) 5 MG tablet TAKE 1 TABLET BY MOUTH  DAILY 90 tablet 3  . potassium chloride SA (KLOR-CON) 20 MEQ tablet TAKE 1 TABLET BY MOUTH  TWICE DAILY 180 tablet 1  . topiramate (TOPAMAX) 50 MG tablet Take 50 mg by mouth 2 (two) times daily.    . traMADol (ULTRAM) 50 MG tablet Take 50 mg by mouth every 6 (six) hours as needed.    . warfarin (COUMADIN) 5 MG tablet TAKE 1 TABLET BY MOUTH  DAILY OR AS DIRECTED BY  ANTICOAGULATION CLINIC. 90 tablet 0  . [DISCONTINUED] simvastatin (ZOCOR) 20  MG tablet TAKE 1 TABLET BY MOUTH AT  BEDTIME 90 tablet 3   No current facility-administered medications on file prior to visit.    Past Medical History:  Diagnosis Date  . Anxiety   . 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 Depression   . Elevated LFT's   . HA (headache)   . Hyperlipemia   . Hypertension   . Intracranial hemorrhage, spontaneous intraparenchymal, associated with coagulopathy, remote, resolved 06/23/2011  . Lupus anticoagulant disorder ([place])   . Lupus anticoagulant with hypercoagulable state ([place])  06/23/2011  . Lupus erythematosus 06/23/2011  . Stroke [facility])     Past Surgical History:  Procedure Laterality Date  . ABDOMINAL HYSTERECTOMY    . TRANSPHENOIDAL PITUITARY RESECTION      Social History   Socioeconomic History  . Marital status: Married    Spouse name: Not on file  . Number of children: 2  . Years of education: Not on file  . Highest education level: Not on file  Occupational History    Employer: UNEMPLOYED  Tobacco Use  . Smoking status: Never Smoker  . Smokeless tobacco: Never Used  Substance and Sexual Activity  . Alcohol use: No    Alcohol/week: 0.0 standard drinks  . Drug use: No  . Sexual activity: Not on file  Other Topics Concern  . Not on file  Social History Narrative   2 cups of coffee daily   Retired   Scientist, physiological Strain:   . Difficulty of Paying Living Expenses: Not on file  Food Insecurity:   . Worried About Charity fundraiser in the Last Year: Not on file  . Ran Out of Food in the Last Year: Not on file  Transportation Needs:   . Lack of Transportation (Medical): Not on file  . Lack of Transportation (Non-Medical): Not on file  Physical Activity:   . Days of Exercise per Week: Not on file  . Minutes of Exercise per Session: Not on file  Stress:   . Feeling of Stress : Not on file  Social Connections:   . Frequency of Communication with Friends and Family: Not on file  . Frequency of Social Gatherings with Friends and Family: Not on file  . Attends Religious Services: Not on file  . Active Member of Clubs or Organizations: Not on file  . Attends Archivist Meetings: Not on file  . Marital Status: Not on file    Family History  Problem Relation Age of Onset  . Diabetes Brother   . Diabetes Sister   . Hypertension Mother   . Dementia Mother   . Bladder Cancer Brother   . Colon cancer Neg Hx     Review of Systems  Constitutional: Negative for chills and fever.  HENT:  Positive for ear pain (left ).   Respiratory: Negative for cough, shortness of breath and wheezing.   Cardiovascular: Positive for leg swelling. Negative for chest pain and palpitations.  Neurological: Negative for dizziness, light-headedness and headaches.       Objective:   Vitals:   06/06/19 1517  BP: 128/72  Pulse: 76  Temp: 98.9 F (37.2 C)  SpO2: 98%   BP Readings from Last 3 Encounters:  06/06/19 128/72  12/01/18 130/82  11/29/18 128/70   Wt Readings from Last 3 Encounters:  06/06/19 202 lb 12.8 oz (92 kg)  12/01/18 199 lb (90.3 kg)  11/29/18 185 lb (  83.9 kg)   Body mass index is 34.81 kg/m.   Physical Exam    Constitutional: Appears well-developed and well-nourished. No distress.  HENT:  Head: Normocephalic and atraumatic.  Neck: Neck supple. No tracheal deviation present. No thyromegaly present.  No cervical lymphadenopathy Cardiovascular: Normal rate, regular rhythm and normal heart sounds.   No murmur heard. No carotid bruit .  1 + pitting RLE edema, mild LLE edema Pulmonary/Chest: Effort normal and breath sounds normal. No respiratory distress. No has no wheezes. No rales.  Skin: Skin is warm and dry. Not diaphoretic.  Psychiatric: Normal mood and affect. Behavior is normal.      Assessment & Plan:    See Problem List for Assessment and Plan of chronic medical problems.    This visit occurred during the SARS-CoV-2 public health emergency.  Safety protocols were in place, including screening questions prior to the visit, additional usage of staff PPE, and extensive cleaning of exam room while observing appropriate contact time as indicated for disinfecting solutions.

## 2019-06-05 NOTE — Patient Instructions (Addendum)
  Blood work was reviewed.     Medications reviewed and updated.  Changes include :    Stop the simvastatin.  Start lipitor 40 mg daily.    Start buspirone three times a day for anxiety.   Increase the vitamin D to 2000 units daily    Your prescription(s) have been submitted to your pharmacy. Please take as directed and contact our office if you believe you are having problem(s) with the medication(s).     Please followup in 6 months

## 2019-06-06 ENCOUNTER — Other Ambulatory Visit: Payer: Self-pay

## 2019-06-06 ENCOUNTER — Encounter: Payer: Self-pay | Admitting: Internal Medicine

## 2019-06-06 ENCOUNTER — Ambulatory Visit (INDEPENDENT_AMBULATORY_CARE_PROVIDER_SITE_OTHER): Payer: Medicare Other | Admitting: Internal Medicine

## 2019-06-06 VITALS — BP 128/72 | HR 76 | Temp 98.9°F | Ht 64.0 in | Wt 202.8 lb

## 2019-06-06 DIAGNOSIS — G90512 Complex regional pain syndrome I of left upper limb: Secondary | ICD-10-CM

## 2019-06-06 DIAGNOSIS — R7303 Prediabetes: Secondary | ICD-10-CM

## 2019-06-06 DIAGNOSIS — E559 Vitamin D deficiency, unspecified: Secondary | ICD-10-CM

## 2019-06-06 DIAGNOSIS — F419 Anxiety disorder, unspecified: Secondary | ICD-10-CM

## 2019-06-06 DIAGNOSIS — F329 Major depressive disorder, single episode, unspecified: Secondary | ICD-10-CM

## 2019-06-06 DIAGNOSIS — I10 Essential (primary) hypertension: Secondary | ICD-10-CM | POA: Diagnosis not present

## 2019-06-06 DIAGNOSIS — D6862 Lupus anticoagulant syndrome: Secondary | ICD-10-CM

## 2019-06-06 DIAGNOSIS — E782 Mixed hyperlipidemia: Secondary | ICD-10-CM | POA: Diagnosis not present

## 2019-06-06 DIAGNOSIS — K219 Gastro-esophageal reflux disease without esophagitis: Secondary | ICD-10-CM

## 2019-06-06 DIAGNOSIS — Z8673 Personal history of transient ischemic attack (TIA), and cerebral infarction without residual deficits: Secondary | ICD-10-CM

## 2019-06-06 DIAGNOSIS — F32A Depression, unspecified: Secondary | ICD-10-CM

## 2019-06-06 MED ORDER — ATORVASTATIN CALCIUM 40 MG PO TABS: 40.0000 mg | ORAL_TABLET | Freq: Every day | ORAL | 3 refills | Status: DC

## 2019-06-06 MED ORDER — BUSPIRONE HCL 5 MG PO TABS: 5.0000 mg | ORAL_TABLET | Freq: Three times a day (TID) | ORAL | 5 refills | Status: DC

## 2019-06-06 NOTE — Assessment & Plan Note (Signed)
Lab Results  Component Value Date   HGBA1C 5.9 06/02/2019   Stressed the importance of regular exercise, weight loss and decreasing her sugars/carbohydrates

## 2019-06-06 NOTE — Assessment & Plan Note (Addendum)
Chronic  Has been seeing pain management - I will start prescribing the tramadol - only taking one a day-advised to keep tramadol to a minimum since she is also on Cymbalta She will let me know when she needs a new prescription

## 2019-06-06 NOTE — Assessment & Plan Note (Signed)
Chronic BP well controlled Current regimen effective and well tolerated Continue current medications at current doses cmp reviewed  

## 2019-06-06 NOTE — Assessment & Plan Note (Signed)
Chronic GERD controlled Continue daily medication  

## 2019-06-06 NOTE — Assessment & Plan Note (Signed)
Chronic, not controlled Continue duloxetine 30 mg twice daily Continue alprazolam at current dose Start BuSpar 5 mg 3 times daily

## 2019-06-06 NOTE — Assessment & Plan Note (Signed)
Chronic Taking warfarin-we will need this lifelong Monitored by Jenny Reichmann our Coumadin nurse  Continue current dose

## 2019-06-06 NOTE — Assessment & Plan Note (Signed)
Chronic Continue warfarin BP well controlled LDL not controlled-stop simvastatin, start atorvastatin 40 mg

## 2019-06-06 NOTE — Assessment & Plan Note (Signed)
Chronic LDL not at goal we will hold the wall Simvastatin 20 mg Lipitor 40 mg Encouraged weight loss and regular exercise

## 2019-06-06 NOTE — Assessment & Plan Note (Signed)
Chronic Not ideally controlled-she still has some depression We will continue Cymbalta 30 mg twice daily for now since she is on the tramadol, but can try increasing to 90 mg at her next visit if depression is not improved Encouraged regular exercise

## 2019-06-06 NOTE — Assessment & Plan Note (Signed)
Chronic Vitamin D level slightly low Currently taking 1000 units daily-increase to 2000 units daily Will recheck vitamin D level at next visit

## 2019-06-07 ENCOUNTER — Encounter: Payer: Self-pay | Admitting: Internal Medicine

## 2019-06-25 ENCOUNTER — Encounter: Payer: Self-pay | Admitting: Internal Medicine

## 2019-06-25 DIAGNOSIS — L249 Irritant contact dermatitis, unspecified cause: Secondary | ICD-10-CM | POA: Diagnosis not present

## 2019-06-28 DIAGNOSIS — B372 Candidiasis of skin and nail: Secondary | ICD-10-CM | POA: Diagnosis not present

## 2019-06-29 ENCOUNTER — Other Ambulatory Visit: Payer: Self-pay

## 2019-06-29 ENCOUNTER — Ambulatory Visit (INDEPENDENT_AMBULATORY_CARE_PROVIDER_SITE_OTHER): Payer: Medicare Other | Admitting: General Practice

## 2019-06-29 DIAGNOSIS — Z7901 Long term (current) use of anticoagulants: Secondary | ICD-10-CM | POA: Diagnosis not present

## 2019-06-29 DIAGNOSIS — Z8673 Personal history of transient ischemic attack (TIA), and cerebral infarction without residual deficits: Secondary | ICD-10-CM

## 2019-06-29 LAB — POCT INR: INR: 1.5 — AB (ref 2.0–3.0)

## 2019-06-29 NOTE — Progress Notes (Signed)
I have reviewed the results and agree with this plan   

## 2019-06-29 NOTE — Patient Instructions (Signed)
Pre visit review using our clinic review tool, if applicable. No additional management support is needed unless otherwise documented below in the visit note.  Take 1 tablet today (2/24) and take 1 1/2 tablets tomorros (2/25).  On Friday increase dosage and take 1 tablet daily except 1/2 tablet on Mondays and Saturdays.  Re-check in 3 weeks.

## 2019-07-03 ENCOUNTER — Ambulatory Visit: Payer: Medicare Other | Attending: Internal Medicine

## 2019-07-03 ENCOUNTER — Other Ambulatory Visit: Payer: Self-pay

## 2019-07-03 DIAGNOSIS — Z23 Encounter for immunization: Secondary | ICD-10-CM | POA: Insufficient documentation

## 2019-07-03 NOTE — Progress Notes (Signed)
   Covid-19 Vaccination Clinic  Name:  Taleena Farling    MRN: [id number] DOB: [birth year]  07/03/2019  Ms. Boyzo was observed post Covid-19 immunization for 15 minutes without incidence. She was provided with Vaccine Information Sheet and instruction to access the V-Safe system.   Ms. Kaczanowski was instructed to call 911 with any severe reactions post vaccine: Marland Kitchen Difficulty breathing  . Swelling of your face and throat  . A fast heartbeat  . A bad rash all over your body  . Dizziness and weakness    Immunizations Administered    Name Date Dose VIS Date Route   Pfizer COVID-19 Vaccine 07/03/2019  3:59 PM 0.3 mL 04/15/2019 Intramuscular   Manufacturer: [place]   Lot: [id number]   [place]: [id number]

## 2019-07-07 ENCOUNTER — Telehealth: Payer: Self-pay

## 2019-07-07 NOTE — Telephone Encounter (Signed)
LVM with response below.  

## 2019-07-07 NOTE — Telephone Encounter (Signed)
New message    Pt c/o medication issue:  1. Name of Medication: atorvastatin (LIPITOR) 40 MG tablet  2. How are you currently taking this medication (dosage and times per day)? Daily   3. Are you having a reaction (difficulty breathing--STAT)? Dizzy   4. What is your medication issue? Wondering should the mg be decrease , please advise.

## 2019-07-07 NOTE — Telephone Encounter (Signed)
Decrease dose to 20 mg ( 1/2 of a pill) daily and see if dizziness goes away.  Call if it does not

## 2019-07-17 ENCOUNTER — Encounter: Payer: Self-pay | Admitting: Internal Medicine

## 2019-07-19 ENCOUNTER — Other Ambulatory Visit: Payer: Self-pay

## 2019-07-20 ENCOUNTER — Other Ambulatory Visit: Payer: Self-pay

## 2019-07-20 ENCOUNTER — Ambulatory Visit (INDEPENDENT_AMBULATORY_CARE_PROVIDER_SITE_OTHER): Payer: Medicare Other | Admitting: General Practice

## 2019-07-20 ENCOUNTER — Encounter: Payer: Self-pay | Admitting: Internal Medicine

## 2019-07-20 DIAGNOSIS — Z8673 Personal history of transient ischemic attack (TIA), and cerebral infarction without residual deficits: Secondary | ICD-10-CM

## 2019-07-20 DIAGNOSIS — Z7901 Long term (current) use of anticoagulants: Secondary | ICD-10-CM | POA: Diagnosis not present

## 2019-07-20 LAB — POCT INR: INR: 1.5 — AB (ref 2.0–3.0)

## 2019-07-20 NOTE — Progress Notes (Signed)
I have reviewed the results and agree with this plan   

## 2019-07-20 NOTE — Patient Instructions (Signed)
Pre visit review using our clinic review tool, if applicable. No additional management support is needed unless otherwise documented below in the visit note.  Take 1 1/2 tablets today and tomorrow.  On Friday start taking 1 tablet daily.  Re-check in 2 weeks.

## 2019-07-21 ENCOUNTER — Other Ambulatory Visit: Payer: Self-pay | Admitting: General Practice

## 2019-07-21 DIAGNOSIS — D6862 Lupus anticoagulant syndrome: Secondary | ICD-10-CM

## 2019-07-21 DIAGNOSIS — Z7901 Long term (current) use of anticoagulants: Secondary | ICD-10-CM

## 2019-07-21 MED ORDER — WARFARIN SODIUM 5 MG PO TABS: ORAL_TABLET | ORAL | 1 refills | Status: DC

## 2019-07-21 NOTE — Telephone Encounter (Signed)
Forwarding msg to Cindy([facility]) to refill warfarin.Marland KitchenJohny Turner

## 2019-08-03 ENCOUNTER — Ambulatory Visit: Payer: Medicare Other | Attending: Internal Medicine

## 2019-08-03 DIAGNOSIS — Z23 Encounter for immunization: Secondary | ICD-10-CM

## 2019-08-03 NOTE — Progress Notes (Signed)
   Covid-19 Vaccination Clinic  Name:  Amanda Turner    MRN: [id number] DOB: 24-Sep-1945  08/03/2019  [name] was observed post Covid-19 immunization for 15 minutes without incident. She was provided with Vaccine Information Sheet and instruction to access the V-Safe system.   [name] was instructed to call 911 with any severe reactions post vaccine: Marland Kitchen Difficulty breathing  . Swelling of face and throat  . A fast heartbeat  . A bad rash all over body  . Dizziness and weakness   Immunizations Administered    Name Date Dose VIS Date Route   Pfizer COVID-19 Vaccine 08/03/2019  3:39 PM 0.3 mL 04/15/2019 Intramuscular   Manufacturer: [place]   Lot: [id number]   [place]: [id number]

## 2019-08-04 ENCOUNTER — Ambulatory Visit (INDEPENDENT_AMBULATORY_CARE_PROVIDER_SITE_OTHER): Payer: Medicare Other

## 2019-08-04 ENCOUNTER — Other Ambulatory Visit: Payer: Self-pay | Admitting: Internal Medicine

## 2019-08-04 ENCOUNTER — Ambulatory Visit (INDEPENDENT_AMBULATORY_CARE_PROVIDER_SITE_OTHER): Payer: Medicare Other | Admitting: General Practice

## 2019-08-04 ENCOUNTER — Encounter: Payer: Self-pay | Admitting: Internal Medicine

## 2019-08-04 ENCOUNTER — Other Ambulatory Visit: Payer: Self-pay

## 2019-08-04 ENCOUNTER — Ambulatory Visit (INDEPENDENT_AMBULATORY_CARE_PROVIDER_SITE_OTHER): Payer: Medicare Other | Admitting: Internal Medicine

## 2019-08-04 VITALS — BP 136/72 | HR 66 | Temp 98.2°F | Wt 210.0 lb

## 2019-08-04 DIAGNOSIS — Z7901 Long term (current) use of anticoagulants: Secondary | ICD-10-CM

## 2019-08-04 DIAGNOSIS — S6721XA Crushing injury of right hand, initial encounter: Secondary | ICD-10-CM

## 2019-08-04 DIAGNOSIS — Z8673 Personal history of transient ischemic attack (TIA), and cerebral infarction without residual deficits: Secondary | ICD-10-CM | POA: Diagnosis not present

## 2019-08-04 DIAGNOSIS — M79641 Pain in right hand: Secondary | ICD-10-CM | POA: Diagnosis not present

## 2019-08-04 LAB — POCT INR: INR: 2.3 (ref 2.0–3.0)

## 2019-08-04 NOTE — Progress Notes (Signed)
Agree with management.  Amanda Turner J [name], MD  

## 2019-08-04 NOTE — Progress Notes (Signed)
Subjective:  Patient ID: Amanda Turner, female    DOB: 11-Apr-1946  Age: 74 y.o. MRN: [id number]  CC: Hand Injury  This visit occurred during the SARS-CoV-2 public health emergency.  Safety protocols were in place, including screening questions prior to the visit, additional usage of staff PPE, and extensive cleaning of exam room while observing appropriate contact time as indicated for disinfecting solutions.    HPI Amanda Turner presents for concerns about her right hand.  She describes a minor injury 3 days ago when her right hand got stuck between a computer in a chair.  She is concerned about discomfort and bruising.  She has not noticed any swelling.  She has normal use of her fingers and the rest of her hand.  She denies numbness, weakness, or tingling.  Outpatient Medications Prior to Visit  Medication Sig Dispense Refill  . ALPRAZolam (XANAX) 0.5 MG tablet TAKE 1/2 TABLET BY MOUTH IN THE MORNING AND 1 TABLET BY MOUTH IN THE EVENING 135 tablet 0  . aspirin 81 MG tablet Take 81 mg by mouth daily.      . Aspirin Buf,CaCarb-MgCarb-MgO, 81 MG TABS aspirin 81 mg tablet   81 mg by oral route.    Marland Kitchen atorvastatin (LIPITOR) 40 MG tablet Take 1 tablet (40 mg total) by mouth daily. 90 tablet 3  . busPIRone (BUSPAR) 5 MG tablet Take 1 tablet (5 mg total) by mouth 3 (three) times daily. 90 tablet 5  . DULoxetine (CYMBALTA) 30 MG capsule TAKE 1 CAPSULE BY MOUTH TWO TIMES DAILY 180 capsule 1  . famotidine (PEPCID) 20 MG tablet Take 1 tablet (20 mg total) by mouth 2 (two) times daily. Annual appt due in July must see provider for future refills 180 tablet 1  . furosemide (LASIX) 40 MG tablet TAKE 1 TABLET BY MOUTH  TWICE DAILY 180 tablet 3  . lisinopril (ZESTRIL) 5 MG tablet TAKE 1 TABLET BY MOUTH  DAILY 90 tablet 3  . potassium chloride SA (KLOR-CON) 20 MEQ tablet TAKE 1 TABLET BY MOUTH  TWICE DAILY 180 tablet 1  . topiramate (TOPAMAX) 50 MG tablet Take 50 mg by mouth 2 (two) times daily.    .  traMADol (ULTRAM) 50 MG tablet Take 50 mg by mouth every 6 (six) hours as needed.    . warfarin (COUMADIN) 5 MG tablet Take 1 tablet daily or as directed by anticoagulation clinic 100 tablet 1   No facility-administered medications prior to visit.    ROS Review of Systems  All other systems reviewed and are negative.   Objective:  BP 136/72 (BP Location: Left Arm, Patient Position: Sitting, Cuff Size: Normal)   Pulse 66   Temp 98.2 F (36.8 C)   Wt 210 lb (95.3 kg)   SpO2 95%   BMI 36.05 kg/m   BP Readings from Last 3 Encounters:  08/04/19 136/72  06/06/19 128/72  12/01/18 130/82    Wt Readings from Last 3 Encounters:  08/04/19 210 lb (95.3 kg)  06/06/19 202 lb 12.8 oz (92 kg)  12/01/18 199 lb (90.3 kg)    Physical Exam Musculoskeletal:     Comments: Dorsum of right hand shows mild swelling and dense ecchymosis.  There is no warmth, induration, fluctuance, streaking, erythema, or exudate.  There is mild diffuse tenderness to palpation but no bony derangements.  Distally there is good sensation and capillary refill.  See photo.     Lab Results  Component Value Date   WBC 5.4  06/02/2019   HGB 15.1 (H) 06/02/2019   HCT 44.1 06/02/2019   PLT 249.0 06/02/2019   GLUCOSE 129 (H) 06/02/2019   CHOL 209 (H) 06/02/2019   TRIG 137.0 06/02/2019   HDL 78.80 06/02/2019   LDLDIRECT 112.6 04/23/2011   LDLCALC 103 (H) 06/02/2019   ALT 30 06/02/2019   AST 19 06/02/2019   NA 142 06/02/2019   K 3.5 06/02/2019   CL 106 06/02/2019   CREATININE 0.85 06/02/2019   BUN 18 06/02/2019   CO2 28 06/02/2019   TSH 0.826 04/05/2018   INR 2.3 08/04/2019   HGBA1C 5.9 06/02/2019    MR BRAIN W WO CONTRAST  Result Date: 05/30/2018  [facility] [address], [place], [place] [address] [phone] NEUROIMAGING REPORT STUDY DATE: 05/29/2018 PATIENT NAME: Amanda Turner DOB: [birth year] MRN: [id number] ORDERING CLINICIAN: Dr [name] CLINICAL HISTORY:  68 year patient with  memory loss COMPARISON FILMS: MRI Brain 02/22/2016 EXAM: MRI Brain w/wo TECHNIQUE: MRI of the brain with and without contrast was obtained utilizing 5 mm axial slices with T1, T2, T2 flair, T2 star gradient echo and diffusion weighted views.  T1 sagittal, T2 coronal and postcontrast views in the axial and coronal plane were obtained. CONTRAST: 18 ml iv multihance IMAGING SITE: [facility] FINDINGS:  Left cerebral hemisphere shows remote age old cortical and subcortical infarctions in the frontal and parietal regions which have progressed to atrophy, encephalomalacia and gliosis. There is some old hemosiderin residua.  There is compensatory dilatation of the left lateral ventricle.  There are mild changes of periventricular and subcortical chronic microvascular ischemia.  No acute abnormalities are noted.  Diffusion-weighted imaging is negative for acute ischemia.  Extra-axial brain structures appear normal.  Orbits appear unremarkable.  Paranasal sinuses show mild chronic inflammatory changes. .  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  The flow voids of the large vessels of the intracranial circulation appear to be patent.  Postcontrast images do not result in abnormal areas of enhancement.  Visualized portion of the cervical spine and craniovertebral junction show no significant abnormalities.   Abnormal MRI scan of the brain showing remote age left frontal and parietal cortical and subcortical infarcts with encephalomalacia and gliosis and mild changes of chronic microvascular ischemia.  No acute abnormalities.  Compared to previous MRI from 02/22/2016 expected evolutionary changes are noted.  Postcontrast images do not show any abnormal areas of enhancement. INTERPRETING PHYSICIAN: Antony Contras, MD Certified in  Neuroimaging by [place] [employer] and [employer]   DG Hand Complete Right  Result Date: 08/04/2019  CLINICAL DATA:  Right hand pain and bruising after recent injury EXAM: RIGHT HAND - COMPLETE 3+ VIEW COMPARISON:  None. FINDINGS: No acute fracture or dislocation. Joint spaces are overall preserved. No focal erosion. Soft tissues are within normal limits. No radiopaque foreign body identified. IMPRESSION: No acute osseous abnormality, right hand. Electronically Signed   By: Davina Poke D.O.   On: 08/04/2019 16:04    Assessment & Plan:   Joslyne was seen today for hand injury.  Diagnoses and all orders for this visit:  Crushing injury of right hand, initial encounter- Based on the description of the injury, her symptoms, the exam, and normal plain films this is a contusion that has resulted in ecchymosis because she is anticoagulated with Coumadin.  Her INR is therapeutic at 2.3.  I recommended that she keep the right hand elevated as much as possible and take Tylenol  for any discomfort. -     DG Hand Complete Right; Future   I am having Makaylia Scalici maintain her aspirin, traMADol, topiramate, Aspirin Buf(CaCarb-MgCarb-MgO), furosemide, lisinopril, DULoxetine, ALPRAZolam, potassium chloride SA, famotidine, atorvastatin, busPIRone, and warfarin.  No orders of the defined types were placed in this encounter.    Follow-up: Return if symptoms worsen or fail to improve.  Scarlette Calico, MD

## 2019-08-04 NOTE — Patient Instructions (Signed)
Hand Contusion A hand contusion is a deep bruise to the hand. Contusions are the result of a blunt injury to tissues and muscle fibers under the skin. The injury causes bleeding under the skin. The skin overlying the contusion may turn blue, purple, or yellow. Minor injuries may cause a painless contusion, but more severe injuries may cause contusions that stay painful and swollen for a few weeks. What are the causes? This condition is usually caused by a hard hit or direct force to your hand, such as having a heavy object fall on your hand. What are the signs or symptoms? Symptoms of this condition include:  A swollen hand.  Pain and tenderness in your hand.  Discoloration of your hand. The area may have redness and then turn blue, purple, or yellow. How is this diagnosed? This condition is diagnosed based on:  A physical exam.  Your medical history.  Imaging studies, such as: ? An X-ray. This may be needed to check for other injuries, such as broken bones (fractures). ? A CT scan or an MRI. This may be done if your health care provider thinks you have torn or injured ligaments. How is this treated? This condition may be treated with:  Rest, ice, pressure (compression), and raising (elevating) the injured area. This is often called RICE therapy.  An elastic wrap to support your hand.  Over-the-counter medicines to control pain. Follow these instructions at home: RICE therapy   Rest the injured area.  If directed, put ice on the injured area. ? Put ice in a plastic bag. ? Place a towel between your skin and the bag. ? Leave the ice on for 20 minutes, 2-3 times a day.  If directed, apply light compression to the injured area using an elastic wrap. ? Make sure the wrap is not too tight. ? If your fingers become numb or turn cold or blue, take the wrap off and reapply it more loosely. ? Remove and reapply the wrap as told by your health care provider.  Raise (elevate) the  injured area above the level of your heart while you are sitting or lying down. General instructions  Take over-the-counter and prescription medicines only as told by your health care provider.  Protect your hand from getting injured further.  Keep all follow-up visits as told by your health care provider. This is important. Contact a health care provider if:  Your symptoms do not improve after several days of treatment.  You have increased redness, swelling, or pain in your hand or fingers.  You have difficulty moving the injured area.  Your swelling or pain is not relieved with medicines. Get help right away if:  You have severe pain.  Your hand or fingers become numb.  Your hand or fingers turn pale, blue, or cold.  You cannot move your hand or wrist.  Your hand is warm to the touch. Summary  A hand contusion is a deep bruise to the hand.  Contusions are the result of a blunt injury to tissues and muscle fibers under the skin.  This injury is treated with rest, ice, compression and elevation. This information is not intended to replace advice given to you by your health care provider. Make sure you discuss any questions you have with your health care provider. Document Revised: 02/17/2018 Document Reviewed: 02/17/2018 Elsevier Patient Education  [place].

## 2019-08-04 NOTE — Patient Instructions (Addendum)
Pre visit review using our clinic review tool, if applicable. No additional management support is needed unless otherwise documented below in the visit note.  Continue to take 1 tablet daily.  Re-check in 4 weeks.

## 2019-08-10 ENCOUNTER — Encounter: Payer: Self-pay | Admitting: Internal Medicine

## 2019-08-11 MED ORDER — ALPRAZOLAM 0.5 MG PO TABS: ORAL_TABLET | ORAL | 0 refills | Status: DC

## 2019-08-11 MED ORDER — ATORVASTATIN CALCIUM 40 MG PO TABS: 40.0000 mg | ORAL_TABLET | Freq: Every day | ORAL | 3 refills | Status: DC

## 2019-08-11 MED ORDER — DULOXETINE HCL 30 MG PO CPEP: ORAL_CAPSULE | ORAL | 3 refills | Status: DC

## 2019-08-15 ENCOUNTER — Encounter: Payer: Self-pay | Admitting: Internal Medicine

## 2019-08-15 ENCOUNTER — Other Ambulatory Visit: Payer: Self-pay | Admitting: Internal Medicine

## 2019-08-15 DIAGNOSIS — Z8673 Personal history of transient ischemic attack (TIA), and cerebral infarction without residual deficits: Secondary | ICD-10-CM

## 2019-08-15 DIAGNOSIS — E782 Mixed hyperlipidemia: Secondary | ICD-10-CM

## 2019-08-15 DIAGNOSIS — I10 Essential (primary) hypertension: Secondary | ICD-10-CM

## 2019-08-15 DIAGNOSIS — R7303 Prediabetes: Secondary | ICD-10-CM

## 2019-08-16 ENCOUNTER — Encounter: Payer: Self-pay | Admitting: Internal Medicine

## 2019-08-17 ENCOUNTER — Telehealth: Payer: Self-pay

## 2019-08-17 ENCOUNTER — Encounter: Payer: Self-pay | Admitting: Internal Medicine

## 2019-08-17 NOTE — Telephone Encounter (Signed)
LVM letting pt know that both message have been responded to by Dr. Quay Burow through [facility]. Asked her to call back with any questions or concerns.

## 2019-08-17 NOTE — Telephone Encounter (Signed)
New message    The patient is checking on the mychart messages she sent to Dr. Quay Burow, waiting on a response.    Asking the nurse to call her back.

## 2019-08-23 ENCOUNTER — Telehealth: Payer: Self-pay

## 2019-08-23 NOTE — Telephone Encounter (Signed)
New message    The patient declined an appt to see the nutritionist due to the reason they advise her she was not a Diabetic no appt was not made.    Asking can Referral for her husband instead.

## 2019-08-24 NOTE — Telephone Encounter (Signed)
FYI. Sent in husbands chart.

## 2019-09-01 ENCOUNTER — Telehealth: Payer: Self-pay | Admitting: Internal Medicine

## 2019-09-01 ENCOUNTER — Ambulatory Visit (INDEPENDENT_AMBULATORY_CARE_PROVIDER_SITE_OTHER): Payer: Medicare Other | Admitting: General Practice

## 2019-09-01 ENCOUNTER — Other Ambulatory Visit: Payer: Self-pay

## 2019-09-01 DIAGNOSIS — Z7901 Long term (current) use of anticoagulants: Secondary | ICD-10-CM | POA: Diagnosis not present

## 2019-09-01 DIAGNOSIS — I6529 Occlusion and stenosis of unspecified carotid artery: Secondary | ICD-10-CM

## 2019-09-01 DIAGNOSIS — Z8673 Personal history of transient ischemic attack (TIA), and cerebral infarction without residual deficits: Secondary | ICD-10-CM

## 2019-09-01 LAB — POCT INR: INR: 2.4 (ref 2.0–3.0)

## 2019-09-01 NOTE — Progress Notes (Signed)
  Chronic Care Management   Outreach Note  09/01/2019 Name: Amanda Turner MRN: [id number] DOB: 04/17/1946  Referred by: [name], MD Reason for referral : No chief complaint on file.   An unsuccessful telephone outreach was attempted today. The patient was referred to the pharmacist for assistance with care management and care coordination.    This note is not being shared with the patient for the following reason: To respect privacy (The patient or proxy has requested that the information not be shared).  Follow Up Plan:   [name] [employer] Scheduler

## 2019-09-01 NOTE — Patient Instructions (Signed)
Pre visit review using our clinic review tool, if applicable. No additional management support is needed unless otherwise documented below in the visit note.  Continue to take 1 tablet daily.  Re-check in 6 weeks.

## 2019-09-01 NOTE — Progress Notes (Signed)
  Chronic Care Management   Note  09/01/2019 Name: Amanda Turner MRN: [id number] DOB: Feb 25, 1946  Amanda Turner is a 74 y.o. year old female who is a primary care patient of Burns, Claudina Lick, MD. I reached out to [employer] by phone today in response to a referral sent by Amanda Turner's PCP, Burns, Claudina Lick, MD.   [name] was given information about Chronic Care Management services today including:  1. CCM service includes personalized support from designated clinical staff supervised by her physician, including individualized plan of care and coordination with other care providers 2. 24/7 contact phone numbers for assistance for urgent and routine care needs. 3. Service will only be billed when office clinical staff spend 20 minutes or more in a month to coordinate care. 4. Only one practitioner may furnish and bill the service in a calendar month. 5. The patient may stop CCM services at any time (effective at the end of the month) by phone call to the office staff.   Patient agreed to services and verbal consent obtained.    This note is not being shared with the patient for the following reason: To respect privacy (The patient or proxy has requested that the information not be shared).  Follow up plan:   [name] [employer] Scheduler

## 2019-09-06 ENCOUNTER — Encounter: Payer: Self-pay | Admitting: Sports Medicine

## 2019-09-06 ENCOUNTER — Ambulatory Visit: Payer: Medicare Other | Admitting: Sports Medicine

## 2019-09-06 ENCOUNTER — Other Ambulatory Visit: Payer: Self-pay

## 2019-09-06 DIAGNOSIS — L84 Corns and callosities: Secondary | ICD-10-CM | POA: Diagnosis not present

## 2019-09-06 DIAGNOSIS — I739 Peripheral vascular disease, unspecified: Secondary | ICD-10-CM

## 2019-09-06 DIAGNOSIS — L909 Atrophic disorder of skin, unspecified: Secondary | ICD-10-CM | POA: Diagnosis not present

## 2019-09-06 DIAGNOSIS — M79671 Pain in right foot: Secondary | ICD-10-CM

## 2019-09-06 DIAGNOSIS — B359 Dermatophytosis, unspecified: Secondary | ICD-10-CM

## 2019-09-06 DIAGNOSIS — M79672 Pain in left foot: Secondary | ICD-10-CM

## 2019-09-06 DIAGNOSIS — R7303 Prediabetes: Secondary | ICD-10-CM | POA: Diagnosis not present

## 2019-09-06 MED ORDER — CLOTRIMAZOLE 1 % EX SOLN: 1.0000 "application " | Freq: Every day | CUTANEOUS | 5 refills | Status: AC

## 2019-09-06 NOTE — Progress Notes (Signed)
Subjective: Amanda Turner is a 74 y.o. female patient who presents to office for evaluation of Right> Left foot pain secondary to callus skin. Patient complains of pain at the lesion present Right>Left foot at the ball of the foot. Patient has tried pedicures with no relief in symptoms. Patient denies any other pedal complaints.   Last blood sugar 101, not diabetic per patient, considered pre-daibetic  Review of Systems  All other systems reviewed and are negative.   Patient Active Problem List   Diagnosis Date Noted  . Crushing injury of right hand 08/04/2019  . Vitamin D deficiency 06/01/2019  . Dysuria 12/01/2018  . Osteopenia 03/07/2018  . Wrist fracture, closed 02/24/2018  . GERD (gastroesophageal reflux disease) 02/24/2018  . 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 Chronic wrist pain, left 09/10/2017  . Leg swelling 08/30/2017  . Decreased range of motion of left wrist 07/29/2017  . 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 Long term (current) use of anticoagulants 01/14/2017  . Chronic jaw pain 10/27/2016  . Prediabetes 08/06/2016  . Subluxation of extensor carpi ulnaris tendon, left, initial encounter 03/24/2016  . Left lumbar radiculopathy 02/12/2016  . Achilles tendinosis 01/17/2016  . Cervical radiculopathy at C5 12/13/2015  . Low back pain 12/13/2015  . Leg cramps 12/13/2015  . Anxiety 10/23/2015  . Gait instability 10/23/2015  . Edema 10/23/2015  . Encounter for therapeutic drug monitoring 10/30/2014  . Carotid stenosis 09/06/2012  . Lupus anticoagulant with hypercoagulable state ([place]) 06/23/2011  . H/O: CVA (cerebrovascular accident) 03/06/2011  . Hematuria 02/11/2010  . 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 Hyperlipidemia 12/04/2006  . Depression 12/04/2006  . Essential hypertension 12/04/2006    Current Outpatient Medications on File Prior to Visit   Medication Sig Dispense Refill  . ALPRAZolam (XANAX) 0.5 MG tablet TAKE 1/2 TABLET BY MOUTH IN THE MORNING AND 1 TABLET BY MOUTH IN THE EVENING 135 tablet 0  . aspirin 81 MG tablet Take 81 mg by mouth daily.      . Aspirin Buf,CaCarb-MgCarb-MgO, 81 MG TABS aspirin 81 mg tablet   81 mg by oral route.    Marland Kitchen atorvastatin (LIPITOR) 40 MG tablet Take 1 tablet (40 mg total) by mouth daily. 90 tablet 3  . BAYER ASPIRIN EC LOW DOSE 81 MG EC tablet     . [facility] CORTISONE MAXIMUM STRENGTH 1 % APPLY TO AFFECTED AREA TWICE A DAY FOR 14 DAYS    . DULoxetine (CYMBALTA) 30 MG capsule TAKE 1 CAPSULE BY MOUTH  TWICE DAILY 180 capsule 3  . famotidine (PEPCID) 20 MG tablet Take 1 tablet (20 mg total) by mouth 2 (two) times daily. Annual appt due in July must see provider for future refills 180 tablet 1  . furosemide (LASIX) 40 MG tablet TAKE 1 TABLET BY MOUTH  TWICE DAILY 180 tablet 3  . lisinopril (ZESTRIL) 5 MG tablet TAKE 1 TABLET BY MOUTH  DAILY 90 tablet 3  . mupirocin ointment (BACTROBAN) 2 % mupirocin 2 % topical ointment    . nystatin (MYCOSTATIN/NYSTOP) powder APPLY TWO OR THREE TIMES DAILY    . nystatin-triamcinolone ointment (MYCOLOG) APPLY TWO TIMES DAILY    . potassium chloride SA (KLOR-CON) 20 MEQ tablet TAKE 1 TABLET BY MOUTH  TWICE DAILY 180 tablet 1  . topiramate (TOPAMAX) 25 MG tablet Take 25 mg by mouth 2 (two) times daily.    . traMADol (ULTRAM) 50  MG tablet Take 50 mg by mouth every 6 (six) hours as needed.    . warfarin (COUMADIN) 5 MG tablet Take 1 tablet daily or as directed by anticoagulation clinic 100 tablet 1  . [DISCONTINUED] simvastatin (ZOCOR) 20 MG tablet TAKE 1 TABLET BY MOUTH AT  BEDTIME 90 tablet 3   No current facility-administered medications on file prior to visit.    Allergies  Allergen Reactions  . Gabapentin Nausea Only    Dizziness,   . Cymbalta [Duloxetine Hcl] Itching and Nausea Only    headaches  . Lisinopril Cough  . Losartan Swelling  . Peanut Oil      REACTION: hives  . Shrimp Flavor     hives  . Sulfonamide Derivatives     REACTION: shortness of breath    Objective:  General: Alert and oriented x3 in no acute distress  Dermatology: Keratotic lesion present sub met 2 bilateral R>L and left medial hallux with skin lines transversing the lesion, pain is present with direct pressure to the lesion with a central nucleated core noted, mild webspace macerations, no ecchymosis bilateral, all nails x 10 are mildly elongated but well manicured/polish.  Vascular: Dorsalis Pedis and Posterior Tibial pedal pulses 1/4, Capillary Fill Time 3 seconds, + pedal hair growth bilateral, Mild edema bilateral lower extremities, +varcosities, Temperature gradient within normal limits.  Neurology: Johney Maine sensation intact via light touch bilateral.  Musculoskeletal: Mild tenderness with palpation at the keratotic lesion site on Right>Left, Muscular strength 5/5 in all groups without pain or limitation on range of motion. + fat pad atrophy and hammertoe boney deformity noted bilateral.  Assessment and Plan: Problem List Items Addressed This Visit    None    Visit Diagnoses    Callus of foot    -  Primary   Foot pain, bilateral       Fat pad atrophy of foot       Borderline diabetes       Tinea       Relevant Medications   mupirocin ointment (BACTROBAN) 2 %   nystatin (MYCOSTATIN/NYSTOP) powder   nystatin-triamcinolone ointment (MYCOLOG)   clotrimazole (LOTRIMIN) 1 % external solution   PVD (peripheral vascular disease) (HCC)       Relevant Medications   BAYER ASPIRIN EC LOW DOSE 81 MG EC tablet      -Complete examination performed -Discussed treatment options -Parred keratoic lesion using a chisel blade x3; treated the area withSalinocaine covered with moleskin -Encouraged daily skin emollients; gave sample of foot miracle cream -Encouraged use of pumice stone -Advised good supportive shoes and inserts/applied offloading padding to shoes -Rx  Clotrimazole to use in between toes and advised open to air for maceration -Recommend OTC compression stockings for swelling  -Patient to return to office in 3 months or as needed or sooner if condition worsens.  Landis Martins, DPM

## 2019-09-06 NOTE — Patient Instructions (Signed)
Get OTC compression Stocking (mild compression or 24mmgh or below)

## 2019-09-07 ENCOUNTER — Encounter: Payer: Self-pay | Admitting: Sports Medicine

## 2019-09-07 ENCOUNTER — Encounter: Payer: Self-pay | Admitting: Internal Medicine

## 2019-09-08 NOTE — Telephone Encounter (Signed)
Mailed copy of Amanda Turner prescription for 15-11mmHg knee-high compression hose +11pairs/year, to pt.

## 2019-09-08 NOTE — Progress Notes (Signed)
Agree with management.  [place] Ganesh J Shamel Galyean, MD  

## 2019-09-09 NOTE — Telephone Encounter (Signed)
No appt on file.. sent pt msg back stating appt is need for evaluation.Marland KitchenJohny Chess

## 2019-09-23 NOTE — Addendum Note (Signed)
Addended by: Aviva Signs M on: 09/23/2019 02:03 PM   Modules accepted: Orders

## 2019-09-27 ENCOUNTER — Encounter: Payer: Self-pay | Admitting: Sports Medicine

## 2019-09-28 ENCOUNTER — Telehealth: Payer: Medicare Other

## 2019-09-29 ENCOUNTER — Telehealth: Payer: Self-pay | Admitting: Sports Medicine

## 2019-09-29 ENCOUNTER — Telehealth: Payer: Self-pay | Admitting: Internal Medicine

## 2019-09-29 NOTE — Progress Notes (Signed)
  Chronic Care Management   Outreach Note  09/29/2019 Name: Amanda Turner MRN: [id number] DOB: 1945-09-03  Referred by: [name], MD Reason for referral : No chief complaint on file.   An unsuccessful telephone outreach was attempted today. The patient was referred to the pharmacist for assistance with care management and care coordination.   This note is not being shared with the patient for the following reason: To respect privacy (The patient or proxy has requested that the information not be shared).  Follow Up Plan:   Earney Hamburg Upstream Scheduler

## 2019-09-29 NOTE — Telephone Encounter (Signed)
Called lvm for pt to sched

## 2019-10-05 ENCOUNTER — Telehealth: Payer: Self-pay | Admitting: Internal Medicine

## 2019-10-05 NOTE — Progress Notes (Signed)
  Chronic Care Management   Outreach Note  10/05/2019 Name: Mikiya Felici MRN: [id number] DOB: 1945/11/04  Referred by: [name], MD Reason for referral : No chief complaint on file.   An unsuccessful telephone outreach was attempted today. The patient was referred to the pharmacist for assistance with care management and care coordination.   This note is not being shared with the patient for the following reason: To respect privacy (The patient or proxy has requested that the information not be shared).  Follow Up Plan:   Earney Hamburg Upstream Scheduler

## 2019-10-06 DIAGNOSIS — Z1231 Encounter for screening mammogram for malignant neoplasm of breast: Secondary | ICD-10-CM | POA: Diagnosis not present

## 2019-10-06 LAB — HM MAMMOGRAPHY

## 2019-10-07 ENCOUNTER — Encounter: Payer: Self-pay | Admitting: Internal Medicine

## 2019-10-10 ENCOUNTER — Encounter: Payer: Self-pay | Admitting: Internal Medicine

## 2019-10-10 ENCOUNTER — Encounter: Payer: Self-pay | Admitting: Sports Medicine

## 2019-10-10 NOTE — Telephone Encounter (Signed)
    Patient requesting hydrochlorothiazide be sent to [facility]

## 2019-10-11 ENCOUNTER — Encounter: Payer: Self-pay | Admitting: Internal Medicine

## 2019-10-12 ENCOUNTER — Telehealth: Payer: Self-pay | Admitting: *Deleted

## 2019-10-12 NOTE — Telephone Encounter (Signed)
Called and left a message for the patient to come by the [place] office and get fitted for some surgigrip compressions. Lattie Haw

## 2019-10-12 NOTE — Telephone Encounter (Signed)
-----   Message from Landis Martins, [place] sent at 10/07/2019  4:58 PM EDT ----- She can come by [place] to be fitted for Surgigrip compression sleeves -Dr. Chauncey Cruel ----- Message ----- From: Viviana Simpler, [facility] Sent: 10/07/2019   2:57 PM EDT To: Landis Martins, DPM  Patient called and stated that the compression socks were a little too tight and wanted to see if she could have something a little bit bigger and can not really afford the socks. Please advise. Lattie Haw

## 2019-10-13 ENCOUNTER — Ambulatory Visit: Payer: Medicare Other | Admitting: General Practice

## 2019-10-13 NOTE — Progress Notes (Signed)
Subjective:    Patient ID: Amanda Turner, female    DOB: December 22, 1945, 74 y.o.   MRN: [id number]  HPI The patient is here for an acute visit.   Leg swelling;  It started a while ago.  It started > 3 weeks ago.  She is now having foot pain because of the swelling.  She denies any changes in meds/suupplements/acitivty that may have caused it.  She is compliant with low sodium diet.  She elevates her legs when sitting.  She is taking lasix bid.   She exercises some - Walking in house and at stores.    She tried compression socks with open toes and her toes became very swollen and painful.    The swelling improves only a little overnight  Medications and allergies reviewed with patient and updated if appropriate.  Patient Active Problem List   Diagnosis Date Noted  . Crushing injury of right hand 08/04/2019  . Vitamin D deficiency 06/01/2019  . Dysuria 12/01/2018  . Osteopenia 03/07/2018  . Wrist fracture, closed 02/24/2018  . GERD (gastroesophageal reflux disease) 02/24/2018  . 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 Chronic wrist pain, left 09/10/2017  . Leg swelling 08/30/2017  . Decreased range of motion of left wrist 07/29/2017  . 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 Long term (current) use of anticoagulants 01/14/2017  . Chronic jaw pain 10/27/2016  . Prediabetes 08/06/2016  . Subluxation of extensor carpi ulnaris tendon, left, initial encounter 03/24/2016  . Left lumbar radiculopathy 02/12/2016  . Achilles tendinosis 01/17/2016  . Cervical radiculopathy at C5 12/13/2015  . Low back pain 12/13/2015  . Leg cramps 12/13/2015  . Anxiety 10/23/2015  . Gait instability 10/23/2015  . Edema 10/23/2015  . Encounter for therapeutic drug monitoring 10/30/2014  . Carotid stenosis 09/06/2012  . Lupus anticoagulant with hypercoagulable state ([place]) 06/23/2011  . H/O: CVA  (cerebrovascular accident) 03/06/2011  . Hematuria 02/11/2010  . 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 Hyperlipidemia 12/04/2006  . Depression 12/04/2006  . Essential hypertension 12/04/2006    Current Outpatient Medications on File Prior to Visit  Medication Sig Dispense Refill  . ALPRAZolam (XANAX) 0.5 MG tablet TAKE 1/2 TABLET BY MOUTH IN THE MORNING AND 1 TABLET BY MOUTH IN THE EVENING 135 tablet 0  . aspirin 81 MG tablet Take 81 mg by mouth daily.      . Aspirin Buf,CaCarb-MgCarb-MgO, 81 MG TABS aspirin 81 mg tablet   81 mg by oral route.    Marland Kitchen BAYER ASPIRIN EC LOW DOSE 81 MG EC tablet     . clotrimazole (LOTRIMIN) 1 % external solution Apply 1 application topically at bedtime. In between toes 60 mL 5  . CVS CORTISONE MAXIMUM STRENGTH 1 % APPLY TO AFFECTED AREA TWICE A DAY FOR 14 DAYS    . DULoxetine (CYMBALTA) 30 MG capsule TAKE 1 CAPSULE BY MOUTH  TWICE DAILY 180 capsule 3  . famotidine (PEPCID) 20 MG tablet Take 1 tablet (20 mg total) by mouth 2 (two) times daily. Annual appt due in July must see provider for future refills 180 tablet 1  . furosemide (LASIX) 40 MG tablet TAKE 1 TABLET BY MOUTH  TWICE DAILY 180 tablet 3  . lisinopril (ZESTRIL) 5 MG tablet TAKE 1 TABLET BY MOUTH  DAILY 90 tablet 3  . mupirocin ointment (BACTROBAN) 2 % mupirocin 2 % topical ointment    .  nystatin (MYCOSTATIN/NYSTOP) powder APPLY TWO OR THREE TIMES DAILY    . nystatin-triamcinolone ointment (MYCOLOG) APPLY TWO TIMES DAILY    . potassium chloride SA (KLOR-CON) 20 MEQ tablet TAKE 1 TABLET BY MOUTH  TWICE DAILY 180 tablet 1  . topiramate (TOPAMAX) 25 MG tablet Take 25 mg by mouth 2 (two) times daily.    . traMADol (ULTRAM) 50 MG tablet Take 50 mg by mouth every 6 (six) hours as needed.    . warfarin (COUMADIN) 5 MG tablet Take 1 tablet daily or as directed by anticoagulation clinic 100 tablet 1  . [DISCONTINUED] simvastatin (ZOCOR) 20 MG tablet TAKE 1 TABLET BY MOUTH AT  BEDTIME 90 tablet 3   No  current facility-administered medications on file prior to visit.    Past Medical History:  Diagnosis Date  . Anxiety   . 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 Depression   . Elevated LFT's   . HA (headache)   . Hyperlipemia   . Hypertension   . Intracranial hemorrhage, spontaneous intraparenchymal, associated with coagulopathy, remote, resolved 06/23/2011  . Lupus anticoagulant disorder ([place])   . Lupus anticoagulant with hypercoagulable state ([place]) 06/23/2011  . Lupus erythematosus 06/23/2011  . Stroke [facility])     Past Surgical History:  Procedure Laterality Date  . ABDOMINAL HYSTERECTOMY    . TRANSPHENOIDAL PITUITARY RESECTION      Social History   Socioeconomic History  . Marital status: Married    Spouse name: Not on file  . Number of children: 2  . Years of education: Not on file  . Highest education level: Not on file  Occupational History    Employer: UNEMPLOYED  Tobacco Use  . Smoking status: Never Smoker  . Smokeless tobacco: Never Used  Substance and Sexual Activity  . Alcohol use: No    Alcohol/week: 0.0 standard drinks  . Drug use: No  . Sexual activity: Not on file  Other Topics Concern  . Not on file  Social History Narrative   2 cups of coffee daily   Retired   Scientist, physiological Strain:   . Difficulty of Paying Living Expenses:   Food Insecurity:   . Worried About Charity fundraiser in the Last Year:   . Arboriculturist in the Last Year:   Transportation Needs:   . Film/video editor (Medical):   Marland Kitchen Lack of Transportation (Non-Medical):   Physical Activity:   . Days of Exercise per Week:   . Minutes of Exercise per Session:   Stress:   . Feeling of Stress :   Social Connections:   . Frequency of Communication with Friends and Family:   . Frequency of Social Gatherings with Friends and Family:   . Attends Religious Services:   . Active Member of Clubs or  Organizations:   . Attends Archivist Meetings:   Marland Kitchen Marital Status:     Family History  Problem Relation Age of Onset  . Diabetes Brother   . Diabetes Sister   . Hypertension Mother   . Dementia Mother   . Bladder Cancer Brother   . Colon cancer Neg Hx     Review of Systems  Respiratory: Negative for shortness of breath.   Cardiovascular: Positive for leg swelling. Negative for chest pain and palpitations.       Objective:   Vitals:   10/14/19 1546  BP: 140/70  Pulse: 70  Temp: 98.3 F (36.8 C)  SpO2: 98%   BP Readings from Last 3 Encounters:  10/14/19 140/70  08/04/19 136/72  06/06/19 128/72   Wt Readings from Last 3 Encounters:  10/14/19 203 lb (92.1 kg)  08/04/19 210 lb (95.3 kg)  06/06/19 202 lb 12.8 oz (92 kg)   Body mass index is 34.84 kg/m.   Physical Exam    Constitutional: Appears well-developed and well-nourished. No distress.  Head: Normocephalic and atraumatic.  Neck: Neck supple. No tracheal deviation present. No thyromegaly present.  No cervical lymphadenopathy Cardiovascular: Normal rate, regular rhythm and normal heart sounds.  No murmur heard. No carotid bruit .  1+ pitting b/l LE edema Pulmonary/Chest: Effort normal and breath sounds normal. No respiratory distress. No has no wheezes. No rales.  Skin: Skin is warm and dry. Not diaphoretic.  Psychiatric: Normal mood and affect. Behavior is normal.       Assessment & Plan:    See Problem List for Assessment and Plan of chronic medical problems.    This visit occurred during the SARS-CoV-2 public health emergency.  Safety protocols were in place, including screening questions prior to the visit, additional usage of staff PPE, and extensive cleaning of exam room while observing appropriate contact time as indicated for disinfecting solutions.

## 2019-10-14 ENCOUNTER — Other Ambulatory Visit: Payer: Self-pay

## 2019-10-14 ENCOUNTER — Encounter: Payer: Self-pay | Admitting: Internal Medicine

## 2019-10-14 ENCOUNTER — Ambulatory Visit (INDEPENDENT_AMBULATORY_CARE_PROVIDER_SITE_OTHER): Payer: Medicare Other | Admitting: Internal Medicine

## 2019-10-14 VITALS — BP 140/70 | HR 70 | Temp 98.3°F | Ht 64.0 in | Wt 203.0 lb

## 2019-10-14 DIAGNOSIS — M7989 Other specified soft tissue disorders: Secondary | ICD-10-CM | POA: Diagnosis not present

## 2019-10-14 MED ORDER — FUROSEMIDE 40 MG PO TABS: ORAL_TABLET | ORAL | 3 refills | Status: DC

## 2019-10-14 NOTE — Assessment & Plan Note (Signed)
Chronic b/l LE edema that is worse for the past month ? No obvious cause Will inc lasix to 80 mg in am and 40 mg in pm for three days only Inc KCl to 2 tabs in am and 1 tab in pm for three days only New compression socks - full sock - not open toe Stressed low sodium diet Elevate legs when sitting Increase exercise Work on weight loss Follow up if no improvement

## 2019-10-14 NOTE — Patient Instructions (Addendum)
Take 80 mg of lasix in the morning and 40 mg in the afternoon for three days.   Take 2 potassium pills daily in the morning and one in the evening while on the higher dose.   Wear compression socks daily.  Elevate your legs when sitting.   Eat a low sodium diet.    Work on weight loss  Walk daily - the goal is 30 minutes 5 times a week.

## 2019-10-18 NOTE — Telephone Encounter (Signed)
   Scheduler left message voicemail to get appointment scheduled to discuss HCTZ and Lasiix

## 2019-10-24 ENCOUNTER — Other Ambulatory Visit: Payer: Self-pay

## 2019-10-24 ENCOUNTER — Ambulatory Visit (INDEPENDENT_AMBULATORY_CARE_PROVIDER_SITE_OTHER): Payer: Medicare Other | Admitting: General Practice

## 2019-10-24 DIAGNOSIS — Z7901 Long term (current) use of anticoagulants: Secondary | ICD-10-CM

## 2019-10-24 DIAGNOSIS — Z8673 Personal history of transient ischemic attack (TIA), and cerebral infarction without residual deficits: Secondary | ICD-10-CM

## 2019-10-24 LAB — POCT INR: INR: 2.6 (ref 2.0–3.0)

## 2019-10-24 NOTE — Patient Instructions (Addendum)
Pre visit review using our clinic review tool, if applicable. No additional management support is needed unless otherwise documented below in the visit note.  Continue to take 1 tablet daily.  Re-check in 6 weeks.

## 2019-10-27 ENCOUNTER — Encounter: Payer: Self-pay | Admitting: Internal Medicine

## 2019-10-27 NOTE — Progress Notes (Signed)
Outside notes received. Information abstracted. Notes sent to scan.  

## 2019-10-28 ENCOUNTER — Telehealth: Payer: Self-pay | Admitting: Internal Medicine

## 2019-10-28 NOTE — Telephone Encounter (Signed)
At this point - continue everything else we discussed-increase walking, weight loss, elevating legs and low-sodium diet.

## 2019-10-28 NOTE — Telephone Encounter (Signed)
Pt called and wanted to let Dr. Quay Burow know that after increasing the pt's dieretics. The selling in her feet have gone down but the swelling in her legs have stayed the same. She states she wears her compression socks everyday and would like a call back if there is anything else that she needs to do.

## 2019-10-28 NOTE — Telephone Encounter (Signed)
Message left for patient today with info 

## 2019-11-02 ENCOUNTER — Telehealth: Payer: Self-pay | Admitting: Internal Medicine

## 2019-11-02 NOTE — Progress Notes (Signed)
  Chronic Care Management   Outreach Note  11/02/2019 Name: Amanda Turner MRN: [id number] DOB: [birth year]  Referred by: [name], MD Reason for referral : No chief complaint on file.   An unsuccessful telephone outreach was attempted today. The patient was referred to the pharmacist for assistance with care management and care coordination. This note is not being shared with the patient for the following reason: To respect privacy (The patient or proxy has requested that the information not be shared).  Follow Up Plan:   Earney Hamburg Upstream Scheduler

## 2019-11-04 ENCOUNTER — Telehealth: Payer: Self-pay | Admitting: Internal Medicine

## 2019-11-04 NOTE — Progress Notes (Signed)
  Chronic Care Management   Outreach Note  11/04/2019 Name: Amanda Turner MRN: [id number] DOB: [birth year]  Referred by: [name], MD Reason for referral : No chief complaint on file.   An unsuccessful telephone outreach was attempted today. The patient was referred to the pharmacist for assistance with care management and care coordination.   Follow Up Plan:   Earney Hamburg Upstream Scheduler

## 2019-11-08 ENCOUNTER — Telehealth: Payer: Self-pay | Admitting: Internal Medicine

## 2019-11-08 NOTE — Progress Notes (Signed)
°  Chronic Care Management   Outreach Note  11/08/2019 Name: Anzleigh Slaven MRN: [id number] DOB: 02-07-1946  Referred by: [name], MD Reason for referral : No chief complaint on file.   An unsuccessful telephone outreach was attempted today. The patient was referred to the pharmacist for assistance with care management and care coordination. This note is not being shared with the patient for the following reason: To respect privacy (The patient or proxy has requested that the information not be shared).  Follow Up Plan:   Earney Hamburg Upstream Scheduler

## 2019-11-17 ENCOUNTER — Encounter: Payer: Self-pay | Admitting: Internal Medicine

## 2019-11-25 ENCOUNTER — Encounter: Payer: Self-pay | Admitting: Internal Medicine

## 2019-11-25 DIAGNOSIS — Z7901 Long term (current) use of anticoagulants: Secondary | ICD-10-CM

## 2019-11-26 MED ORDER — WARFARIN SODIUM 5 MG PO TABS: ORAL_TABLET | ORAL | 1 refills | Status: DC

## 2019-11-28 ENCOUNTER — Encounter: Payer: Self-pay | Admitting: Internal Medicine

## 2019-11-28 DIAGNOSIS — E782 Mixed hyperlipidemia: Secondary | ICD-10-CM

## 2019-11-28 DIAGNOSIS — L84 Corns and callosities: Secondary | ICD-10-CM | POA: Diagnosis not present

## 2019-11-28 DIAGNOSIS — M21611 Bunion of right foot: Secondary | ICD-10-CM | POA: Diagnosis not present

## 2019-11-28 DIAGNOSIS — I739 Peripheral vascular disease, unspecified: Secondary | ICD-10-CM | POA: Diagnosis not present

## 2019-11-28 DIAGNOSIS — M21612 Bunion of left foot: Secondary | ICD-10-CM | POA: Diagnosis not present

## 2019-11-28 DIAGNOSIS — M2041 Other hammer toe(s) (acquired), right foot: Secondary | ICD-10-CM | POA: Diagnosis not present

## 2019-11-28 DIAGNOSIS — M2042 Other hammer toe(s) (acquired), left foot: Secondary | ICD-10-CM | POA: Diagnosis not present

## 2019-11-28 DIAGNOSIS — E559 Vitamin D deficiency, unspecified: Secondary | ICD-10-CM

## 2019-11-28 DIAGNOSIS — R7303 Prediabetes: Secondary | ICD-10-CM

## 2019-11-28 DIAGNOSIS — L603 Nail dystrophy: Secondary | ICD-10-CM | POA: Diagnosis not present

## 2019-12-01 ENCOUNTER — Telehealth: Payer: Self-pay

## 2019-12-01 NOTE — Telephone Encounter (Deleted)
ERROR

## 2019-12-02 DIAGNOSIS — I739 Peripheral vascular disease, unspecified: Secondary | ICD-10-CM | POA: Diagnosis not present

## 2019-12-05 ENCOUNTER — Ambulatory Visit: Payer: Medicare Other

## 2019-12-05 ENCOUNTER — Other Ambulatory Visit (INDEPENDENT_AMBULATORY_CARE_PROVIDER_SITE_OTHER): Payer: Medicare Other

## 2019-12-05 DIAGNOSIS — R7303 Prediabetes: Secondary | ICD-10-CM | POA: Diagnosis not present

## 2019-12-05 DIAGNOSIS — E559 Vitamin D deficiency, unspecified: Secondary | ICD-10-CM

## 2019-12-05 DIAGNOSIS — E782 Mixed hyperlipidemia: Secondary | ICD-10-CM

## 2019-12-05 LAB — COMPREHENSIVE METABOLIC PANEL
ALT: 34 U/L (ref 0–35)
AST: 22 U/L (ref 0–37)
Albumin: 4.1 g/dL (ref 3.5–5.2)
Alkaline Phosphatase: 78 U/L (ref 39–117)
BUN: 15 mg/dL (ref 6–23)
CO2: 25 mEq/L (ref 19–32)
Calcium: 9.2 mg/dL (ref 8.4–10.5)
Chloride: 104 mEq/L (ref 96–112)
Creatinine, Ser: 0.86 mg/dL (ref 0.40–1.20)
GFR: 64.57 mL/min (ref >60.00)
Glucose, Bld: 118 mg/dL — ABNORMAL HIGH (ref 70–99)
Potassium: 3 mEq/L — ABNORMAL LOW (ref 3.5–5.1)
Sodium: 139 mEq/L (ref 135–145)
Total Bilirubin: 0.7 mg/dL (ref 0.2–1.2)
Total Protein: 7.2 g/dL (ref 6.0–8.3)

## 2019-12-05 LAB — VITAMIN D 25 HYDROXY (VIT D DEFICIENCY, FRACTURES): VITD: 24.44 ng/mL — ABNORMAL LOW (ref 30.00–100.00)

## 2019-12-05 LAB — LIPID PANEL
Cholesterol: 317 mg/dL — ABNORMAL HIGH (ref 0–200)
HDL: 65.5 mg/dL (ref >39.00)
LDL Cholesterol: 218 mg/dL — ABNORMAL HIGH (ref 0–99)
NonHDL: 251.62
Total CHOL/HDL Ratio: 5
Triglycerides: 168 mg/dL — ABNORMAL HIGH (ref 0.0–149.0)
VLDL: 33.6 mg/dL (ref 0.0–40.0)

## 2019-12-05 LAB — HEMOGLOBIN A1C: Hgb A1c MFr Bld: 5.8 % (ref 4.6–6.5)

## 2019-12-05 NOTE — Addendum Note (Signed)
Addended by: Trenda Moots on: 01/06/8346 01:14 PM   Modules accepted: Orders

## 2019-12-06 DIAGNOSIS — L603 Nail dystrophy: Secondary | ICD-10-CM | POA: Diagnosis not present

## 2019-12-06 NOTE — Progress Notes (Signed)
Subjective:    Patient ID: Amanda Turner, female    DOB: [birth year], 74 y.o.   MRN: [id number]  HPI The patient is here for follow up of their chronic medical problems, including htn, hyperlipidemia, gerd, anxiety, depression, prediabetes, lupus anticoagulant w/ h/o CVA on wafarin.  She is taking all of her medications as prescribed.   She is not exercising regularly.      She is eating better.    She takes tylenol arthritis for her hip pain and helps.   Medications and allergies reviewed with patient and updated if appropriate.  Patient Active Problem List   Diagnosis Date Noted  . Crushing injury of right hand 08/04/2019  . Vitamin D deficiency 06/01/2019  . Dysuria 12/01/2018  . Osteopenia 03/07/2018  . Wrist fracture, closed 02/24/2018  . GERD (gastroesophageal reflux disease) 02/24/2018  . 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 Chronic wrist pain, left 09/10/2017  . Leg swelling 08/30/2017  . Decreased range of motion of left wrist 07/29/2017  . 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 Long term (current) use of anticoagulants 01/14/2017  . Chronic jaw pain 10/27/2016  . Prediabetes 08/06/2016  . Subluxation of extensor carpi ulnaris tendon, left, initial encounter 03/24/2016  . Left lumbar radiculopathy 02/12/2016  . Achilles tendinosis 01/17/2016  . Cervical radiculopathy at C5 12/13/2015  . Low back pain 12/13/2015  . Leg cramps 12/13/2015  . Anxiety 10/23/2015  . Gait instability 10/23/2015  . Edema 10/23/2015  . Encounter for therapeutic drug monitoring 10/30/2014  . Carotid stenosis 09/06/2012  . Lupus anticoagulant with hypercoagulable state ([place]) 06/23/2011  . H/O: CVA (cerebrovascular accident) 03/06/2011  . Hematuria 02/11/2010  . 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 Hyperlipidemia 12/04/2006  . Depression 12/04/2006  . Essential  hypertension 12/04/2006    Current Outpatient Medications on File Prior to Visit  Medication Sig Dispense Refill  . ALPRAZolam (XANAX) 0.5 MG tablet TAKE 1/2 TABLET BY MOUTH IN THE MORNING AND 1 TABLET BY MOUTH IN THE EVENING 135 tablet 0  . aspirin 81 MG tablet Take 81 mg by mouth daily.      . Aspirin Buf,CaCarb-MgCarb-MgO, 81 MG TABS aspirin 81 mg tablet   81 mg by oral route.    Marland Kitchen BAYER ASPIRIN EC LOW DOSE 81 MG EC tablet     . clotrimazole (LOTRIMIN) 1 % external solution Apply 1 application topically at bedtime. In between toes 60 mL 5  . CVS CORTISONE MAXIMUM STRENGTH 1 % APPLY TO AFFECTED AREA TWICE A DAY FOR 14 DAYS    . DULoxetine (CYMBALTA) 30 MG capsule TAKE 1 CAPSULE BY MOUTH  TWICE DAILY 180 capsule 3  . famotidine (PEPCID) 20 MG tablet Take 1 tablet (20 mg total) by mouth 2 (two) times daily. Annual appt due in July must see provider for future refills 180 tablet 1  . furosemide (LASIX) 40 MG tablet TAKE 1 TABLET BY MOUTH  TWICE DAILY 180 tablet 3  . lisinopril (ZESTRIL) 5 MG tablet TAKE 1 TABLET BY MOUTH  DAILY 90 tablet 3  . mupirocin ointment (BACTROBAN) 2 % mupirocin 2 % topical ointment    . nystatin (MYCOSTATIN/NYSTOP) powder APPLY TWO OR THREE TIMES DAILY    . nystatin-triamcinolone ointment (MYCOLOG) APPLY TWO TIMES DAILY    . topiramate (TOPAMAX) 25 MG tablet Take 25 mg by mouth 2 (two) times daily.    . traMADol (  ULTRAM) 50 MG tablet Take 50 mg by mouth every 6 (six) hours as needed.    . warfarin (COUMADIN) 5 MG tablet Take 1 tablet daily or as directed by anticoagulation clinic 100 tablet 1  . [DISCONTINUED] potassium chloride SA (KLOR-CON) 20 MEQ tablet TAKE 1 TABLET BY MOUTH  TWICE DAILY 180 tablet 1  . [DISCONTINUED] simvastatin (ZOCOR) 20 MG tablet TAKE 1 TABLET BY MOUTH AT  BEDTIME 90 tablet 3   No current facility-administered medications on file prior to visit.    Past Medical History:  Diagnosis Date  . Anxiety   . Benign neoplasm of pituitary gland  and craniopharyngeal duct (pouch) ([place])   . Depression   . Elevated LFT's   . HA (headache)   . Hyperlipemia   . Hypertension   . Intracranial hemorrhage, spontaneous intraparenchymal, associated with coagulopathy, remote, resolved 06/23/2011  . Lupus anticoagulant disorder ([place])   . Lupus anticoagulant with hypercoagulable state ([place]) 06/23/2011  . Lupus erythematosus 06/23/2011  . Stroke [facility])     Past Surgical History:  Procedure Laterality Date  . ABDOMINAL HYSTERECTOMY    . TRANSPHENOIDAL PITUITARY RESECTION      Social History   Socioeconomic History  . Marital status: Married    Spouse name: Not on file  . Number of children: 2  . Years of education: Not on file  . Highest education level: Not on file  Occupational History    Employer: UNEMPLOYED  Tobacco Use  . Smoking status: Never Smoker  . Smokeless tobacco: Never Used  Substance and Sexual Activity  . Alcohol use: No    Alcohol/week: 0.0 standard drinks  . Drug use: No  . Sexual activity: Not on file  Other Topics Concern  . Not on file  Social History Narrative   2 cups of coffee daily   Retired   Scientist, physiological Strain:   . Difficulty of Paying Living Expenses:   Food Insecurity:   . Worried About Charity fundraiser in the Last Year:   . Arboriculturist in the Last Year:   Transportation Needs:   . Film/video editor (Medical):   Marland Kitchen Lack of Transportation (Non-Medical):   Physical Activity:   . Days of Exercise per Week:   . Minutes of Exercise per Session:   Stress:   . Feeling of Stress :   Social Connections:   . Frequency of Communication with Friends and Family:   . Frequency of Social Gatherings with Friends and Family:   . Attends Religious Services:   . Active Member of Clubs or Organizations:   . Attends Archivist Meetings:   Marland Kitchen Marital Status:     Family History  Problem Relation Age of Onset  . Diabetes Brother   . Diabetes  Sister   . Hypertension Mother   . Dementia Mother   . Bladder Cancer Brother   . Colon cancer Neg Hx     Review of Systems  Constitutional: Negative for chills and fever.  Respiratory: Negative for cough, shortness of breath and wheezing.   Cardiovascular: Positive for leg swelling (compression socks helping). Negative for chest pain and palpitations.  Gastrointestinal: Negative for abdominal pain.       No gerd  Musculoskeletal: Positive for arthralgias.  Neurological: Negative for light-headedness and headaches.  Psychiatric/Behavioral: The patient is nervous/anxious.        Objective:   Vitals:   12/07/19 1508  BP: 124/76  Pulse: 65  Temp: 98.7 F (37.1 C)  SpO2: 98%   BP Readings from Last 3 Encounters:  12/07/19 124/76  10/14/19 140/70  08/04/19 136/72   Wt Readings from Last 3 Encounters:  12/07/19 195 lb (88.5 kg)  10/14/19 203 lb (92.1 kg)  08/04/19 210 lb (95.3 kg)   Body mass index is 33.47 kg/m.   Physical Exam    Constitutional: Appears well-developed and well-nourished. No distress.  HENT:  Head: Normocephalic and atraumatic.  Neck: Neck supple. No tracheal deviation present. No thyromegaly present.  No cervical lymphadenopathy Cardiovascular: Normal rate, regular rhythm and normal heart sounds.   No murmur heard. No carotid bruit .  No edema Pulmonary/Chest: Effort normal and breath sounds normal. No respiratory distress. No has no wheezes. No rales.  Skin: Skin is warm and dry. Not diaphoretic.  Psychiatric: Normal mood and affect. Behavior is normal.      Assessment & Plan:    See Problem List for Assessment and Plan of chronic medical problems.    This visit occurred during the SARS-CoV-2 public health emergency.  Safety protocols were in place, including screening questions prior to the visit, additional usage of staff PPE, and extensive cleaning of exam room while observing appropriate contact time as indicated for disinfecting  solutions.

## 2019-12-06 NOTE — Patient Instructions (Addendum)
  Blood work was ordered for 3 weeks at [facility].  Medications reviewed and updated.  Changes include :  Start a new cholesterol medication and increase the potassium to 2 pills twice a day.    Your prescription(s) have been submitted to your pharmacy. Please take as directed and contact our office if you believe you are having problem(s) with the medication(s).     Please followup in 6 months for a physical

## 2019-12-07 ENCOUNTER — Other Ambulatory Visit: Payer: Self-pay

## 2019-12-07 ENCOUNTER — Ambulatory Visit (INDEPENDENT_AMBULATORY_CARE_PROVIDER_SITE_OTHER): Payer: Medicare Other | Admitting: Internal Medicine

## 2019-12-07 ENCOUNTER — Encounter: Payer: Self-pay | Admitting: Internal Medicine

## 2019-12-07 VITALS — BP 124/76 | HR 65 | Temp 98.7°F | Ht 64.0 in | Wt 195.0 lb

## 2019-12-07 DIAGNOSIS — R7303 Prediabetes: Secondary | ICD-10-CM

## 2019-12-07 DIAGNOSIS — Z8673 Personal history of transient ischemic attack (TIA), and cerebral infarction without residual deficits: Secondary | ICD-10-CM

## 2019-12-07 DIAGNOSIS — F3289 Other specified depressive episodes: Secondary | ICD-10-CM

## 2019-12-07 DIAGNOSIS — D6862 Lupus anticoagulant syndrome: Secondary | ICD-10-CM

## 2019-12-07 DIAGNOSIS — K219 Gastro-esophageal reflux disease without esophagitis: Secondary | ICD-10-CM

## 2019-12-07 DIAGNOSIS — I10 Essential (primary) hypertension: Secondary | ICD-10-CM

## 2019-12-07 DIAGNOSIS — M7989 Other specified soft tissue disorders: Secondary | ICD-10-CM

## 2019-12-07 DIAGNOSIS — E782 Mixed hyperlipidemia: Secondary | ICD-10-CM

## 2019-12-07 DIAGNOSIS — F419 Anxiety disorder, unspecified: Secondary | ICD-10-CM

## 2019-12-07 DIAGNOSIS — E559 Vitamin D deficiency, unspecified: Secondary | ICD-10-CM

## 2019-12-07 MED ORDER — ALPRAZOLAM 0.5 MG PO TABS: ORAL_TABLET | ORAL | 0 refills | Status: DC

## 2019-12-07 MED ORDER — ROSUVASTATIN CALCIUM 10 MG PO TABS: 10.0000 mg | ORAL_TABLET | Freq: Every day | ORAL | 3 refills | Status: DC

## 2019-12-07 MED ORDER — POTASSIUM CHLORIDE CRYS ER 20 MEQ PO TBCR: 40.0000 meq | EXTENDED_RELEASE_TABLET | Freq: Two times a day (BID) | ORAL | 1 refills | Status: DC

## 2019-12-07 NOTE — Assessment & Plan Note (Signed)
Chronic Not currently taking vitamin d - level is low Will restart the vitamin d daily

## 2019-12-07 NOTE — Assessment & Plan Note (Signed)
Chronic BP well controlled Current regimen effective and well tolerated Continue current medications at current doses Kidney function normal

## 2019-12-07 NOTE — Assessment & Plan Note (Signed)
Lab Results  Component Value Date   HGBA1C 5.8 12/05/2019   Sugars in prediabetic range, controlled Stressed exercise, low sugar/carb diet

## 2019-12-07 NOTE — Assessment & Plan Note (Signed)
Chronic GERD controlled Continue daily medication  

## 2019-12-07 NOTE — Assessment & Plan Note (Signed)
Chronic Controlled, stable Continue current dose of medication Duloxetine 30 mg BID Xanax 0.5 mg 1/2 in am, 1 in pm

## 2019-12-07 NOTE — Assessment & Plan Note (Signed)
Chronic Continue warfarin Start statin BP well controlled

## 2019-12-07 NOTE — Assessment & Plan Note (Signed)
Chronic Controlled, stable Continue current dose of medication cymbalta 

## 2019-12-07 NOTE — Assessment & Plan Note (Signed)
Chronic Has not tolerated lipitor or zocor generics Start crestor 10 mg daily - will titrate if / if not tolerated Cholesterol poorly controlled - stressed need for statin Cmp, lipids in a few weeks

## 2019-12-07 NOTE — Assessment & Plan Note (Signed)
Chronic Controlled with lasix 40 mg BID Also using compression socks which help a lot Potassium slightly low - increase KCL to 40 mEq BID cmp in a few weeks

## 2019-12-07 NOTE — Assessment & Plan Note (Signed)
Chronic Taking warfarin as prescribed - lifelong Monitored by Jenny Reichmann   Continue current dose

## 2019-12-15 ENCOUNTER — Ambulatory Visit: Payer: Medicare Other | Admitting: Sports Medicine

## 2019-12-15 ENCOUNTER — Other Ambulatory Visit: Payer: Self-pay

## 2019-12-15 MED ORDER — POTASSIUM CHLORIDE CRYS ER 20 MEQ PO TBCR: 40.0000 meq | EXTENDED_RELEASE_TABLET | Freq: Two times a day (BID) | ORAL | 1 refills | Status: DC

## 2019-12-21 DIAGNOSIS — M2041 Other hammer toe(s) (acquired), right foot: Secondary | ICD-10-CM | POA: Diagnosis not present

## 2019-12-21 DIAGNOSIS — M2042 Other hammer toe(s) (acquired), left foot: Secondary | ICD-10-CM | POA: Diagnosis not present

## 2019-12-21 DIAGNOSIS — M21612 Bunion of left foot: Secondary | ICD-10-CM | POA: Diagnosis not present

## 2019-12-21 DIAGNOSIS — M21611 Bunion of right foot: Secondary | ICD-10-CM | POA: Diagnosis not present

## 2019-12-22 ENCOUNTER — Other Ambulatory Visit: Payer: Self-pay | Admitting: Internal Medicine

## 2019-12-22 ENCOUNTER — Ambulatory Visit (INDEPENDENT_AMBULATORY_CARE_PROVIDER_SITE_OTHER): Payer: Medicare Other | Admitting: General Practice

## 2019-12-22 ENCOUNTER — Other Ambulatory Visit: Payer: Self-pay

## 2019-12-22 DIAGNOSIS — Z8673 Personal history of transient ischemic attack (TIA), and cerebral infarction without residual deficits: Secondary | ICD-10-CM | POA: Diagnosis not present

## 2019-12-22 DIAGNOSIS — Z7901 Long term (current) use of anticoagulants: Secondary | ICD-10-CM

## 2019-12-22 LAB — POCT INR: INR: 2.9 (ref 2.0–3.0)

## 2019-12-22 NOTE — Patient Instructions (Signed)
Pre visit review using our clinic review tool, if applicable. No additional management support is needed unless otherwise documented below in the visit note.  Take 1/2 tablet today and then continue to take 1 tablet daily.  Re-check in 6 weeks.  Patient just finished 10 days of amoxicillin.

## 2019-12-22 NOTE — Progress Notes (Signed)
Agree with management.  Estella Malatesta J Kenai Fluegel, MD  

## 2019-12-27 ENCOUNTER — Other Ambulatory Visit (INDEPENDENT_AMBULATORY_CARE_PROVIDER_SITE_OTHER): Payer: Medicare Other

## 2019-12-27 ENCOUNTER — Telehealth: Payer: Self-pay

## 2019-12-27 DIAGNOSIS — E782 Mixed hyperlipidemia: Secondary | ICD-10-CM | POA: Diagnosis not present

## 2019-12-27 DIAGNOSIS — I10 Essential (primary) hypertension: Secondary | ICD-10-CM | POA: Diagnosis not present

## 2019-12-27 LAB — COMPREHENSIVE METABOLIC PANEL
ALT: 29 U/L (ref 0–35)
AST: 19 U/L (ref 0–37)
Albumin: 4.3 g/dL (ref 3.5–5.2)
Alkaline Phosphatase: 90 U/L (ref 39–117)
BUN: 12 mg/dL (ref 6–23)
CO2: 26 mEq/L (ref 19–32)
Calcium: 9.5 mg/dL (ref 8.4–10.5)
Chloride: 102 mEq/L (ref 96–112)
Creatinine, Ser: 0.87 mg/dL (ref 0.40–1.20)
GFR: 63.7 mL/min (ref >60.00)
Glucose, Bld: 116 mg/dL — ABNORMAL HIGH (ref 70–99)
Potassium: 3.4 mEq/L — ABNORMAL LOW (ref 3.5–5.1)
Sodium: 138 mEq/L (ref 135–145)
Total Bilirubin: 0.6 mg/dL (ref 0.2–1.2)
Total Protein: 7.3 g/dL (ref 6.0–8.3)

## 2019-12-27 LAB — LIPID PANEL
Cholesterol: 168 mg/dL (ref 0–200)
HDL: 70.5 mg/dL (ref >39.00)
LDL Cholesterol: 72 mg/dL (ref 0–99)
NonHDL: 97.36
Total CHOL/HDL Ratio: 2
Triglycerides: 128 mg/dL (ref 0.0–149.0)
VLDL: 25.6 mg/dL (ref 0.0–40.0)

## 2019-12-27 NOTE — Addendum Note (Signed)
Addended by: Jacob Moores on: 12/27/2019 04:12 PM   Modules accepted: Orders

## 2019-12-27 NOTE — Telephone Encounter (Signed)
New message  Labs were drawn on  8.2.2021  The patient is asking was Vitamin D labs ordered when she went on 8.2.2021 if not can be added to the 8.2.21 lab ordered.

## 2019-12-28 NOTE — Telephone Encounter (Signed)
Called pt, LVM.   

## 2019-12-28 NOTE — Telephone Encounter (Signed)
Her vitamin d level was checked on 8/2 - it has not been enough time to recheck it - we will recheck it at her next visit

## 2019-12-30 ENCOUNTER — Encounter: Payer: Self-pay | Admitting: Internal Medicine

## 2019-12-30 MED ORDER — POTASSIUM CHLORIDE CRYS ER 20 MEQ PO TBCR: 40.0000 meq | EXTENDED_RELEASE_TABLET | Freq: Two times a day (BID) | ORAL | 1 refills | Status: DC

## 2019-12-30 MED ORDER — ROSUVASTATIN CALCIUM 10 MG PO TABS: 10.0000 mg | ORAL_TABLET | Freq: Every day | ORAL | 1 refills | Status: DC

## 2020-01-02 ENCOUNTER — Encounter: Payer: Self-pay | Admitting: Internal Medicine

## 2020-01-14 ENCOUNTER — Encounter: Payer: Self-pay | Admitting: Internal Medicine

## 2020-01-15 MED ORDER — TRAMADOL HCL 50 MG PO TABS
50.0000 mg | ORAL_TABLET | Freq: Every day | ORAL | 1 refills | Status: DC
Start: 2020-01-15 — End: 2020-07-10

## 2020-01-15 MED ORDER — POTASSIUM CHLORIDE CRYS ER 20 MEQ PO TBCR: 40.0000 meq | EXTENDED_RELEASE_TABLET | Freq: Two times a day (BID) | ORAL | 3 refills | Status: DC

## 2020-01-15 NOTE — Addendum Note (Signed)
Addended by: [name] on: 01/15/2020 04:58 PM   Modules accepted: Orders

## 2020-01-15 NOTE — Addendum Note (Signed)
Addended by: [name] on: 01/15/2020 04:38 PM   Modules accepted: Orders

## 2020-01-16 NOTE — Telephone Encounter (Signed)
[id number] Order reference: [id number]  potassium chloride SA (KLOR-CON) 20 MEQ tablet  [place], [address], [address] Phone:  [phone]  Fax:  [phone]     Order needs to be for [facility]

## 2020-01-18 MED ORDER — POTASSIUM CHLORIDE CRYS ER 20 MEQ PO TBCR: 40.0000 meq | EXTENDED_RELEASE_TABLET | Freq: Two times a day (BID) | ORAL | 3 refills | Status: DC

## 2020-01-18 NOTE — Telephone Encounter (Signed)
[facility] is calling requesting a call back about the chloride RX. [id number]  REF #: [id number]

## 2020-01-18 NOTE — Addendum Note (Signed)
Addended by: [name] on: 01/18/2020 06:02 AM   Modules accepted: Orders

## 2020-01-19 NOTE — Telephone Encounter (Signed)
Notified [facility] spoke w/pharmacist wanting to verify if Klor Con M can be sent to the pt. This is the same Potassium that pt has been on. Gave verbal ok to send.Marland KitchenJohny Chess

## 2020-01-27 ENCOUNTER — Encounter: Payer: Self-pay | Admitting: Internal Medicine

## 2020-01-30 DIAGNOSIS — H6122 Impacted cerumen, left ear: Secondary | ICD-10-CM | POA: Diagnosis not present

## 2020-02-02 ENCOUNTER — Other Ambulatory Visit: Payer: Self-pay

## 2020-02-02 ENCOUNTER — Ambulatory Visit (INDEPENDENT_AMBULATORY_CARE_PROVIDER_SITE_OTHER): Payer: Medicare Other | Admitting: General Practice

## 2020-02-02 DIAGNOSIS — Z8673 Personal history of transient ischemic attack (TIA), and cerebral infarction without residual deficits: Secondary | ICD-10-CM | POA: Diagnosis not present

## 2020-02-02 DIAGNOSIS — Z7901 Long term (current) use of anticoagulants: Secondary | ICD-10-CM

## 2020-02-02 LAB — POCT INR: INR: 2.9 (ref 2.0–3.0)

## 2020-02-02 NOTE — Patient Instructions (Addendum)
Pre visit review using our clinic review tool, if applicable. No additional management support is needed unless otherwise documented below in the visit note.  Continue to take 1 tablet daily.  Re-check in 6 weeks.  

## 2020-02-04 NOTE — Progress Notes (Signed)
Agree with management.  Emera Bussie J Sasuke Yaffe, MD  

## 2020-02-06 ENCOUNTER — Telehealth (INDEPENDENT_AMBULATORY_CARE_PROVIDER_SITE_OTHER): Payer: Self-pay

## 2020-02-07 ENCOUNTER — Ambulatory Visit: Payer: Medicare Other | Attending: Internal Medicine

## 2020-02-07 DIAGNOSIS — Z23 Encounter for immunization: Secondary | ICD-10-CM

## 2020-02-07 NOTE — Progress Notes (Signed)
   Covid-19 Vaccination Clinic  Name:  Amanda Turner    MRN: [id number] DOB: Jul 30, 1945  02/07/2020  Ms. Hole was observed post Covid-19 immunization for 30 minutes based on pre-vaccination screening without incident. She was provided with Vaccine Information Sheet and instruction to access the V-Safe system.   [name] was instructed to call 911 with any severe reactions post vaccine: Marland Kitchen Difficulty breathing  . Swelling of face and throat  . A fast heartbeat  . A bad rash all over body  . Dizziness and weakness

## 2020-03-01 ENCOUNTER — Encounter: Payer: Self-pay | Admitting: Internal Medicine

## 2020-03-04 ENCOUNTER — Other Ambulatory Visit: Payer: Self-pay | Admitting: Internal Medicine

## 2020-03-15 ENCOUNTER — Other Ambulatory Visit: Payer: Self-pay

## 2020-03-15 ENCOUNTER — Ambulatory Visit (INDEPENDENT_AMBULATORY_CARE_PROVIDER_SITE_OTHER): Payer: Medicare Other | Admitting: General Practice

## 2020-03-15 DIAGNOSIS — Z8673 Personal history of transient ischemic attack (TIA), and cerebral infarction without residual deficits: Secondary | ICD-10-CM | POA: Diagnosis not present

## 2020-03-15 DIAGNOSIS — Z7901 Long term (current) use of anticoagulants: Secondary | ICD-10-CM

## 2020-03-15 LAB — POCT INR: INR: 2.5 (ref 2.0–3.0)

## 2020-03-15 NOTE — Progress Notes (Signed)
Agree with management.  Amanda Turner J Shaine Newmark, MD  

## 2020-03-15 NOTE — Patient Instructions (Addendum)
Pre visit review using our clinic review tool, if applicable. No additional management support is needed unless otherwise documented below in the visit note.  Continue to take 1 tablet daily.  Re-check in 6 weeks.  

## 2020-04-24 ENCOUNTER — Ambulatory Visit: Payer: Medicare Other

## 2020-05-08 ENCOUNTER — Other Ambulatory Visit: Payer: Self-pay

## 2020-05-08 ENCOUNTER — Encounter: Payer: Self-pay | Admitting: Internal Medicine

## 2020-05-08 MED ORDER — ROSUVASTATIN CALCIUM 10 MG PO TABS
10.0000 mg | ORAL_TABLET | Freq: Every day | ORAL | 1 refills | Status: DC
Start: 2020-05-08 — End: 2020-07-10

## 2020-05-09 MED ORDER — ALPRAZOLAM 0.5 MG PO TABS
ORAL_TABLET | ORAL | 0 refills | Status: DC
Start: 2020-05-09 — End: 2020-07-04

## 2020-05-14 ENCOUNTER — Other Ambulatory Visit: Payer: Self-pay

## 2020-05-14 ENCOUNTER — Ambulatory Visit (INDEPENDENT_AMBULATORY_CARE_PROVIDER_SITE_OTHER): Payer: Medicare Other | Admitting: General Practice

## 2020-05-14 DIAGNOSIS — Z7901 Long term (current) use of anticoagulants: Secondary | ICD-10-CM

## 2020-05-14 DIAGNOSIS — Z8673 Personal history of transient ischemic attack (TIA), and cerebral infarction without residual deficits: Secondary | ICD-10-CM

## 2020-05-14 LAB — POCT INR: INR: 2.5 (ref 2.0–3.0)

## 2020-05-14 NOTE — Patient Instructions (Addendum)
Pre visit review using our clinic review tool, if applicable. No additional management support is needed unless otherwise documented below in the visit note.  Continue to take 1 tablet daily.  Re-check in 6 weeks.  

## 2020-05-26 ENCOUNTER — Other Ambulatory Visit: Payer: Self-pay | Admitting: Internal Medicine

## 2020-05-26 DIAGNOSIS — Z7901 Long term (current) use of anticoagulants: Secondary | ICD-10-CM

## 2020-05-28 ENCOUNTER — Other Ambulatory Visit: Payer: Self-pay

## 2020-05-28 MED ORDER — DULOXETINE HCL 30 MG PO CPEP: 30.0000 mg | ORAL_CAPSULE | Freq: Two times a day (BID) | ORAL | 3 refills | Status: DC

## 2020-06-11 ENCOUNTER — Telehealth: Payer: Self-pay | Admitting: Internal Medicine

## 2020-06-11 ENCOUNTER — Other Ambulatory Visit: Payer: Medicare Other

## 2020-06-11 ENCOUNTER — Telehealth: Payer: Self-pay

## 2020-06-11 ENCOUNTER — Other Ambulatory Visit: Payer: Self-pay | Admitting: Internal Medicine

## 2020-06-11 DIAGNOSIS — R7303 Prediabetes: Secondary | ICD-10-CM

## 2020-06-11 DIAGNOSIS — E782 Mixed hyperlipidemia: Secondary | ICD-10-CM

## 2020-06-11 DIAGNOSIS — E559 Vitamin D deficiency, unspecified: Secondary | ICD-10-CM

## 2020-06-11 DIAGNOSIS — I10 Essential (primary) hypertension: Secondary | ICD-10-CM

## 2020-06-11 NOTE — Telephone Encounter (Signed)
ordered

## 2020-06-11 NOTE — Addendum Note (Signed)
Addended by: [name] on: 06/11/2020 05:07 PM   Modules accepted: Orders

## 2020-06-11 NOTE — Telephone Encounter (Signed)
Patients husband called and was wondering if lab orders could be placed before up coming appointment. Please advise

## 2020-06-11 NOTE — Telephone Encounter (Signed)
done

## 2020-06-12 ENCOUNTER — Other Ambulatory Visit (INDEPENDENT_AMBULATORY_CARE_PROVIDER_SITE_OTHER): Payer: Medicare Other

## 2020-06-12 DIAGNOSIS — E782 Mixed hyperlipidemia: Secondary | ICD-10-CM | POA: Diagnosis not present

## 2020-06-12 DIAGNOSIS — E559 Vitamin D deficiency, unspecified: Secondary | ICD-10-CM

## 2020-06-12 DIAGNOSIS — R7303 Prediabetes: Secondary | ICD-10-CM | POA: Diagnosis not present

## 2020-06-12 DIAGNOSIS — I10 Essential (primary) hypertension: Secondary | ICD-10-CM

## 2020-06-12 LAB — CBC WITH DIFFERENTIAL/PLATELET
Basophils Absolute: 0.1 10*3/uL (ref 0.0–0.1)
Basophils Relative: 1.3 % (ref 0.0–3.0)
Eosinophils Absolute: 0.1 10*3/uL (ref 0.0–0.7)
Eosinophils Relative: 1.8 % (ref 0.0–5.0)
HCT: 42.7 % (ref 36.0–46.0)
Hemoglobin: 14.7 g/dL (ref 12.0–15.0)
Lymphocytes Relative: 32.5 % (ref 12.0–46.0)
Lymphs Abs: 1.8 10*3/uL (ref 0.7–4.0)
MCHC: 34.4 g/dL (ref 30.0–36.0)
MCV: 93.4 fl (ref 78.0–100.0)
Monocytes Absolute: 0.6 10*3/uL (ref 0.1–1.0)
Monocytes Relative: 9.9 % (ref 3.0–12.0)
Neutro Abs: 3.1 10*3/uL (ref 1.4–7.7)
Neutrophils Relative %: 54.5 % (ref 43.0–77.0)
Platelets: 227 10*3/uL (ref 150.0–400.0)
RBC: 4.57 Mil/uL (ref 3.87–5.11)
RDW: 12.6 % (ref 11.5–15.5)
WBC: 5.7 10*3/uL (ref 4.0–10.5)

## 2020-06-12 LAB — LIPID PANEL
Cholesterol: 196 mg/dL (ref 0–200)
HDL: 74.3 mg/dL (ref >39.00)
LDL Cholesterol: 96 mg/dL (ref 0–99)
NonHDL: 121.43
Total CHOL/HDL Ratio: 3
Triglycerides: 127 mg/dL (ref 0.0–149.0)
VLDL: 25.4 mg/dL (ref 0.0–40.0)

## 2020-06-12 LAB — COMPREHENSIVE METABOLIC PANEL
ALT: 23 U/L (ref 0–35)
AST: 18 U/L (ref 0–37)
Albumin: 4.1 g/dL (ref 3.5–5.2)
Alkaline Phosphatase: 78 U/L (ref 39–117)
BUN: 18 mg/dL (ref 6–23)
CO2: 26 mEq/L (ref 19–32)
Calcium: 9.1 mg/dL (ref 8.4–10.5)
Chloride: 104 mEq/L (ref 96–112)
Creatinine, Ser: 0.89 mg/dL (ref 0.40–1.20)
GFR: 63.98 mL/min (ref >60.00)
Glucose, Bld: 111 mg/dL — ABNORMAL HIGH (ref 70–99)
Potassium: 3.7 mEq/L (ref 3.5–5.1)
Sodium: 139 mEq/L (ref 135–145)
Total Bilirubin: 0.6 mg/dL (ref 0.2–1.2)
Total Protein: 7.1 g/dL (ref 6.0–8.3)

## 2020-06-12 LAB — VITAMIN D 25 HYDROXY (VIT D DEFICIENCY, FRACTURES): VITD: 29.39 ng/mL — ABNORMAL LOW (ref 30.00–100.00)

## 2020-06-12 LAB — HEMOGLOBIN A1C: Hgb A1c MFr Bld: 5.8 % (ref 4.6–6.5)

## 2020-06-12 LAB — TSH: TSH: 0.78 u[IU]/mL (ref 0.35–4.50)

## 2020-06-12 NOTE — Progress Notes (Signed)
Subjective:    Patient ID: Amanda Turner, female    DOB: 1945/10/19, 75 y.o.   MRN: [id number]   This visit occurred during the SARS-CoV-2 public health emergency.  Safety protocols were in place, including screening questions prior to the visit, additional usage of staff PPE, and extensive cleaning of exam room while observing appropriate contact time as indicated for disinfecting solutions.    HPI She is here for a physical exam.   She denies changes in her history.  She has no concerns.  Medications and allergies reviewed with patient and updated if appropriate.  Patient Active Problem List   Diagnosis Date Noted  . Vitamin D deficiency 06/01/2019  . Osteopenia 03/07/2018  . Wrist fracture, closed 02/24/2018  . GERD (gastroesophageal reflux disease) 02/24/2018  . 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 Chronic wrist pain, left 09/10/2017  . Leg swelling 08/30/2017  . Decreased range of motion of left wrist 07/29/2017  . 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 Long term (current) use of anticoagulants 01/14/2017  . Chronic jaw pain 10/27/2016  . Prediabetes 08/06/2016  . Subluxation of extensor carpi ulnaris tendon, left, initial encounter 03/24/2016  . Left lumbar radiculopathy 02/12/2016  . Achilles tendinosis 01/17/2016  . Cervical radiculopathy at C5 12/13/2015  . Low back pain 12/13/2015  . Leg cramps 12/13/2015  . Anxiety 10/23/2015  . Gait instability 10/23/2015  . Edema 10/23/2015  . Encounter for therapeutic drug monitoring 10/30/2014  . Carotid stenosis 09/06/2012  . Lupus anticoagulant with hypercoagulable state ([place]) 06/23/2011  . H/O: CVA (cerebrovascular accident) 03/06/2011  . Hematuria 02/11/2010  . 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 Hyperlipidemia 12/04/2006  . Depression 12/04/2006  . Essential hypertension 12/04/2006    Current  Outpatient Medications on File Prior to Visit  Medication Sig Dispense Refill  . ALPRAZolam (XANAX) 0.5 MG tablet TAKE 1/2 TABLET BY MOUTH IN THE MORNING AND 1 TABLET BY MOUTH IN THE EVENING 135 tablet 0  . aspirin 81 MG tablet Take 81 mg by mouth daily.    . clotrimazole (LOTRIMIN) 1 % external solution Apply 1 application topically at bedtime. In between toes 60 mL 5  . DULoxetine (CYMBALTA) 30 MG capsule Take 1 capsule (30 mg total) by mouth 2 (two) times daily. 180 capsule 3  . famotidine (PEPCID) 20 MG tablet TAKE 1 TABLET BY MOUTH  TWICE DAILY ANNUAL  APPOINTMENT DUE IN JULY  MUST SEE PROVIDER FOR  FUTURE REFILLS 180 tablet 3  . furosemide (LASIX) 40 MG tablet TAKE 1 TABLET BY MOUTH  TWICE DAILY 180 tablet 3  . lisinopril (ZESTRIL) 5 MG tablet TAKE 1 TABLET BY MOUTH  DAILY 90 tablet 3  . potassium chloride SA (KLOR-CON) 20 MEQ tablet Take 2 tablets (40 mEq total) by mouth 2 (two) times daily. 360 tablet 3  . rosuvastatin (CRESTOR) 10 MG tablet Take 1 tablet (10 mg total) by mouth daily. 90 tablet 1  . topiramate (TOPAMAX) 25 MG tablet Take 25 mg by mouth 2 (two) times daily.    . traMADol (ULTRAM) 50 MG tablet Take 1 tablet (50 mg total) by mouth daily. 90 tablet 1  . warfarin (COUMADIN) 5 MG tablet TAKE 1 TABLET BY MOUTH  DAILY OR AS DIRECTED BY  ANTICOAGULATION CLINIC 100 tablet 3  . [DISCONTINUED] simvastatin (ZOCOR) 20 MG tablet TAKE 1 TABLET BY MOUTH AT  BEDTIME 90 tablet 3  No current facility-administered medications on file prior to visit.    Past Medical History:  Diagnosis Date  . Anxiety   . 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 Depression   . Elevated LFT's   . HA (headache)   . Hyperlipemia   . Hypertension   . Intracranial hemorrhage, spontaneous intraparenchymal, associated with coagulopathy, remote, resolved 06/23/2011  . Lupus anticoagulant disorder ([place])   . Lupus anticoagulant with hypercoagulable state ([place]) 06/23/2011   . Lupus erythematosus 06/23/2011  . Stroke [facility])     Past Surgical History:  Procedure Laterality Date  . ABDOMINAL HYSTERECTOMY    . TRANSPHENOIDAL PITUITARY RESECTION      Social History   Socioeconomic History  . Marital status: Married    Spouse name: Not on file  . Number of children: 2  . Years of education: Not on file  . Highest education level: Not on file  Occupational History    Employer: UNEMPLOYED  Tobacco Use  . Smoking status: Never Smoker  . Smokeless tobacco: Never Used  Substance and Sexual Activity  . Alcohol use: No    Alcohol/week: 0.0 standard drinks  . Drug use: No  . Sexual activity: Not on file  Other Topics Concern  . Not on file  Social History Narrative   2 cups of coffee daily   Retired   Scientist, physiological Strain: Not on file  Food Insecurity: Not on file  Transportation Needs: Not on file  Physical Activity: Not on file  Stress: Not on file  Social Connections: Not on file    Family History  Problem Relation Age of Onset  . Diabetes Brother   . Diabetes Sister   . Hypertension Mother   . Dementia Mother   . Bladder Cancer Brother   . Colon cancer Neg Hx     Review of Systems  Constitutional: Negative for chills and fever.  Eyes: Negative for visual disturbance.  Respiratory: Negative for cough, shortness of breath and wheezing.   Cardiovascular: Positive for leg swelling. Negative for chest pain and palpitations.  Gastrointestinal: Negative for abdominal pain, blood in stool, constipation, diarrhea and nausea.       Jerrye Bushy occ  Genitourinary: Positive for hematuria (occ- saw urology). Negative for dysuria.  Musculoskeletal: Positive for arthralgias (left wrist).  Skin: Negative for color change and rash.  Psychiatric/Behavioral: Positive for dysphoric mood. The patient is nervous/anxious.        Objective:   Vitals:   06/13/20 1508  BP: 120/78  Pulse: 71  Temp: 98 F (36.7 C)   SpO2: 96%   Filed Weights   06/13/20 1508  Weight: 195 lb 6.4 oz (88.6 kg)   Body mass index is 33.54 kg/m.  BP Readings from Last 3 Encounters:  06/13/20 120/78  12/07/19 124/76  10/14/19 140/70    Wt Readings from Last 3 Encounters:  06/13/20 195 lb 6.4 oz (88.6 kg)  12/07/19 195 lb (88.5 kg)  10/14/19 203 lb (92.1 kg)   Depression screen [facility] 2/9 06/13/2020 06/07/2019 02/24/2018 09/10/2017 02/04/2017  Decreased Interest 0 3 0 1 0  Down, Depressed, Hopeless 1 1 0 0 1  PHQ - 2 Score 1 4 0 1 1  Altered sleeping 0 0 0 0 -  Tired, decreased energy 0 3 0 1 -  Change in appetite 1 3 0 0 -  Feeling bad or failure about yourself  1 2 0 0 -  Trouble concentrating 0 1 0 0 -  Moving slowly or fidgety/restless 0 1 0 0 -  Suicidal thoughts 0 0 0 0 -  PHQ-9 Score 3 14 0 2 -  Difficult doing work/chores Not difficult at all - - Somewhat difficult -  Some recent data might be hidden      Physical Exam Constitutional: She appears well-developed and well-nourished. No distress.  HENT:  Head: Normocephalic and atraumatic.  Right Ear: External ear normal. Normal ear canal and TM Left Ear: External ear normal.  Normal ear canal and TM Mouth/Throat: Oropharynx is clear and moist.  Eyes: Conjunctivae and EOM are normal.  Neck: Neck supple. No tracheal deviation present. No thyromegaly present.  No carotid bruit  Cardiovascular: Normal rate, regular rhythm and normal heart sounds.   No murmur heard.  No edema. Pulmonary/Chest: Effort normal and breath sounds normal. No respiratory distress. She has no wheezes. She has no rales.  Breast: deferred   Abdominal: Soft. She exhibits no distension. There is no tenderness.  Lymphadenopathy: She has no cervical adenopathy.  Skin: Skin is warm and dry. She is not diaphoretic.  Psychiatric: She has a normal mood and affect. Her behavior is normal.        Assessment & Plan:   Physical exam: Screening blood work    reviewed Immunizations  Flu  vac today, had shingrix Colonoscopy  Up to date  Mammogram  Up to date  Gyn N/A Dexa  Due  -ordered Eye exams   Exercise  little-encouraged increased exercise Weight  Encouraged weight loss Substance abuse  none      See Problem List for Assessment and Plan of chronic medical problems.

## 2020-06-12 NOTE — Patient Instructions (Addendum)
Blood work was ordered for your next visit.    A bone density test was ordered.    Medications changes include :   none  A referral was ordered for dermatology.    Please followup in 6 months   You should call below for a psychiatrist/ therapist  Amanda Turner Maintenance, Female Adopting a healthy lifestyle and getting preventive care are important in promoting health and wellness. Ask your health care provider about:  The right schedule for you to have regular tests and exams.  Things you can do on your own to prevent diseases and keep yourself healthy. What should I know about diet, weight, and exercise? Eat a healthy diet  Eat a diet that includes plenty of vegetables, fruits, low-fat dairy products, and lean protein.  Do not eat a lot of foods that are high in solid fats, added sugars, or sodium.   Maintain a healthy weight Body mass index (BMI) is used to identify weight problems. It estimates body fat based on height and weight. Your health care provider can help determine your BMI and help you achieve or maintain a healthy weight. Get regular exercise Get regular exercise. This is one of the most important things you can do for your health. Most adults should:  Exercise for at least 150 minutes each week. The exercise should increase your heart rate and make you sweat (moderate-intensity exercise).  Do strengthening exercises at least twice a week. This is in addition to the moderate-intensity exercise.  Spend less time sitting. Even light physical activity can be beneficial. Watch cholesterol and blood lipids Have your blood tested for lipids and cholesterol at 75 years of age, then have this test every 5 years. Have your cholesterol levels checked more often if:  Your lipid or cholesterol levels are high.  You  are older than 75 years of age.  You are at high risk for heart disease. What should I know about cancer screening? Depending on your health history and family history, you may need to have cancer screening at various ages. This may include screening for:  Breast cancer.  Cervical cancer.  Colorectal cancer.  Skin cancer.  Lung cancer. What should I know about heart disease, diabetes, and high blood pressure? Blood pressure and heart disease  High blood pressure causes heart disease and increases the risk of stroke. This is more likely to develop in people who have high blood pressure readings, are of African descent, or are overweight.  Have your blood pressure checked: ? Every 3-5 years if you are 41-22 years of age. ? Every year if you are 36 years old or older. Diabetes Have regular diabetes screenings. This checks your fasting blood sugar level. Have the screening done:  Once every three years after age 8 if you are at a normal weight and have a low risk for diabetes.  More often and at a younger age if you are overweight or have a high risk for diabetes. What should I know about preventing infection? Hepatitis B If you have a higher risk for hepatitis B, you should be screened for this virus. Talk with your health care provider to find out if you are at risk for hepatitis B infection. Hepatitis C Testing is recommended for:  Everyone born from 4 through  [place] with known risk factors for hepatitis C. Sexually transmitted infections (STIs)  Get screened for STIs, including gonorrhea and chlamydia, if: ? You are sexually active and are younger than 76 years of age. ? You are older than 75 years of age and your health care provider tells you that you are at risk for this type of infection. ? Your sexual activity has changed since you were last screened, and you are at increased risk for chlamydia or gonorrhea. Ask your health care provider if you are at  risk.  Ask your health care provider about whether you are at high risk for HIV. Your health care provider may recommend a prescription medicine to help prevent HIV infection. If you choose to take medicine to prevent HIV, you should first get tested for HIV. You should then be tested every 3 months for as long as you are taking the medicine. Pregnancy  If you are about to stop having your period (premenopausal) and you may become pregnant, seek counseling before you get pregnant.  Take 400 to 800 micrograms (mcg) of folic acid every day if you become pregnant.  Ask for birth control (contraception) if you want to prevent pregnancy. Osteoporosis and menopause Osteoporosis is a disease in which the bones lose minerals and strength with aging. This can result in bone fractures. If you are 39 years old or older, or if you are at risk for osteoporosis and fractures, ask your health care provider if you should:  Be screened for bone loss.  Take a calcium or vitamin D supplement to lower your risk of fractures.  Be given hormone replacement therapy (HRT) to treat symptoms of menopause. Follow these instructions at home: Lifestyle  Do not use any products that contain nicotine or tobacco, such as cigarettes, e-cigarettes, and chewing tobacco. If you need help quitting, ask your health care provider.  Do not use street drugs.  Do not share needles.  Ask your health care provider for help if you need support or information about quitting drugs. Alcohol use  Do not drink alcohol if: ? Your health care provider tells you not to drink. ? You are pregnant, may be pregnant, or are planning to become pregnant.  If you drink alcohol: ? Limit how much you use to 0-1 drink a day. ? Limit intake if you are breastfeeding.  Be aware of how much alcohol is in your drink. In the [place] one drink equals one 12 oz bottle of beer (355 mL), one 5 oz glass of wine (148 mL), or one 1 oz glass of hard liquor  (44 mL). General instructions  Schedule regular health, dental, and eye exams.  Stay current with your vaccines.  Tell your health care provider if: ? You often feel depressed. ? You have ever been abused or do not feel safe at home. Summary  Adopting a healthy lifestyle and getting preventive care are important in promoting health and wellness.  Follow your health care provider's instructions about healthy diet, exercising, and getting tested or screened for diseases.  Follow your health care provider's instructions on monitoring your cholesterol and blood pressure. This information is not intended to replace advice given to you by your health care provider. Make sure you discuss any questions you have with your health care provider. Document Revised: 04/14/2018 Document Reviewed: 04/14/2018 Elsevier Patient Education  2021 [employer].

## 2020-06-13 ENCOUNTER — Other Ambulatory Visit: Payer: Self-pay

## 2020-06-13 ENCOUNTER — Encounter: Payer: Self-pay | Admitting: Internal Medicine

## 2020-06-13 ENCOUNTER — Ambulatory Visit (INDEPENDENT_AMBULATORY_CARE_PROVIDER_SITE_OTHER): Payer: Medicare Other | Admitting: Internal Medicine

## 2020-06-13 VITALS — BP 120/78 | HR 71 | Temp 98.0°F | Wt 195.4 lb

## 2020-06-13 DIAGNOSIS — K219 Gastro-esophageal reflux disease without esophagitis: Secondary | ICD-10-CM

## 2020-06-13 DIAGNOSIS — R7303 Prediabetes: Secondary | ICD-10-CM | POA: Diagnosis not present

## 2020-06-13 DIAGNOSIS — D6862 Lupus anticoagulant syndrome: Secondary | ICD-10-CM | POA: Diagnosis not present

## 2020-06-13 DIAGNOSIS — F3289 Other specified depressive episodes: Secondary | ICD-10-CM

## 2020-06-13 DIAGNOSIS — R600 Localized edema: Secondary | ICD-10-CM

## 2020-06-13 DIAGNOSIS — I10 Essential (primary) hypertension: Secondary | ICD-10-CM | POA: Diagnosis not present

## 2020-06-13 DIAGNOSIS — E782 Mixed hyperlipidemia: Secondary | ICD-10-CM | POA: Diagnosis not present

## 2020-06-13 DIAGNOSIS — Z8673 Personal history of transient ischemic attack (TIA), and cerebral infarction without residual deficits: Secondary | ICD-10-CM | POA: Diagnosis not present

## 2020-06-13 DIAGNOSIS — M85852 Other specified disorders of bone density and structure, left thigh: Secondary | ICD-10-CM

## 2020-06-13 DIAGNOSIS — G90512 Complex regional pain syndrome I of left upper limb: Secondary | ICD-10-CM

## 2020-06-13 DIAGNOSIS — M85851 Other specified disorders of bone density and structure, right thigh: Secondary | ICD-10-CM | POA: Diagnosis not present

## 2020-06-13 DIAGNOSIS — L989 Disorder of the skin and subcutaneous tissue, unspecified: Secondary | ICD-10-CM

## 2020-06-13 DIAGNOSIS — E559 Vitamin D deficiency, unspecified: Secondary | ICD-10-CM | POA: Diagnosis not present

## 2020-06-13 DIAGNOSIS — F419 Anxiety disorder, unspecified: Secondary | ICD-10-CM

## 2020-06-13 DIAGNOSIS — Z0000 Encounter for general adult medical examination without abnormal findings: Secondary | ICD-10-CM | POA: Diagnosis not present

## 2020-06-13 MED ORDER — VITAMIN D2 50 MCG (2000 UT) PO TABS: 2000.0000 [IU] | ORAL_TABLET | Freq: Every day | ORAL | Status: AC

## 2020-06-13 NOTE — Assessment & Plan Note (Signed)
Chronic Controlled, stable Continue furosemide 40 mg twice daily Kidney function and potassium normal

## 2020-06-13 NOTE — Assessment & Plan Note (Addendum)
Chronic ?  Controlled or not Continue duloxetine 30 mg twice daily Will start seeing a psychiatrist and therapist to help with both her anxiety and depression

## 2020-06-13 NOTE — Assessment & Plan Note (Addendum)
Chronic Has osteopenia DEXA due-ordered Stressed regular exercise Advised taking calcium and vitamin D daily-increase vitamin D to 2000 units daily

## 2020-06-13 NOTE — Assessment & Plan Note (Signed)
Chronic BP well controlled Continue furosemide 40 mg twice daily, lisinopril 5 mg daily cmp reviewed

## 2020-06-13 NOTE — Assessment & Plan Note (Signed)
Chronic Lab Results  Component Value Date   HGBA1C 5.8 06/12/2020   Sugar stable in prediabetic range

## 2020-06-13 NOTE — Assessment & Plan Note (Addendum)
Chronic Vitamin D level still slightly low Advised increasing to 2000 units units of vitamin D daily

## 2020-06-13 NOTE — Assessment & Plan Note (Addendum)
Chronic GERD controlled - occasionally only Continue famotidine 20 mg daily as needed

## 2020-06-13 NOTE — Assessment & Plan Note (Addendum)
Chronic Not ideally controlled Continue duloxetine 30 mg twice daily and alprazolam 0.25 mg in the morning and 0.5 mg in the evening We agree it would be best if she saw psychiatrist and the therapist to help better control her anxiety-names of practices given

## 2020-06-13 NOTE — Assessment & Plan Note (Addendum)
Chronic No concerning symptoms since her last visit Continue warfarin, aspirin 81 mg daily and rosuvastatin 10 mg daily Blood pressure well controlled Sugars controlled

## 2020-06-13 NOTE — Assessment & Plan Note (Signed)
Chronic LDL at goal Continue rosuvastatin 10 mg daily Regular exercise and healthy diet encouraged

## 2020-06-13 NOTE — Assessment & Plan Note (Signed)
Chronic Taking anticoagulation lifelong-warfarin Monitored by her Coumadin clinic CBC within normal limits  Management per Coumadin clinic

## 2020-06-13 NOTE — Assessment & Plan Note (Addendum)
Chronic Has seen pain management Takes Tylenol as needed Taking tramadol as needed-typically takes less than 1 a day Continue tramadol 50 mg daily as needed

## 2020-06-19 ENCOUNTER — Telehealth: Payer: Self-pay | Admitting: Physician Assistant

## 2020-06-19 NOTE — Telephone Encounter (Signed)
Patient is calling for a referral appointment.  Patient is scheduled for 11/13/2020 at 1:45 with [facility], PA-C.

## 2020-06-20 NOTE — Telephone Encounter (Signed)
Referral attached to appointment

## 2020-06-25 ENCOUNTER — Ambulatory Visit (INDEPENDENT_AMBULATORY_CARE_PROVIDER_SITE_OTHER): Payer: Medicare Other | Admitting: General Practice

## 2020-06-25 ENCOUNTER — Other Ambulatory Visit: Payer: Self-pay

## 2020-06-25 DIAGNOSIS — Z7901 Long term (current) use of anticoagulants: Secondary | ICD-10-CM | POA: Diagnosis not present

## 2020-06-25 DIAGNOSIS — Z8673 Personal history of transient ischemic attack (TIA), and cerebral infarction without residual deficits: Secondary | ICD-10-CM

## 2020-06-25 LAB — POCT INR: INR: 2.8 (ref 2.0–3.0)

## 2020-06-25 NOTE — Patient Instructions (Signed)
Pre visit review using our clinic review tool, if applicable. No additional management support is needed unless otherwise documented below in the visit note.  Continue to take 1 tablet daily.  Re-check in 6 weeks.  

## 2020-06-27 DIAGNOSIS — R609 Edema, unspecified: Secondary | ICD-10-CM | POA: Diagnosis not present

## 2020-06-27 DIAGNOSIS — L820 Inflamed seborrheic keratosis: Secondary | ICD-10-CM | POA: Diagnosis not present

## 2020-06-27 DIAGNOSIS — L821 Other seborrheic keratosis: Secondary | ICD-10-CM | POA: Diagnosis not present

## 2020-06-29 ENCOUNTER — Other Ambulatory Visit: Payer: Self-pay

## 2020-06-29 ENCOUNTER — Ambulatory Visit (INDEPENDENT_AMBULATORY_CARE_PROVIDER_SITE_OTHER)
Admission: RE | Admit: 2020-06-29 | Discharge: 2020-06-29 | Disposition: A | Discharge status: Home / Self Care | Payer: Medicare Other | Source: Ambulatory Visit | Attending: Internal Medicine | Admitting: Internal Medicine

## 2020-06-29 ENCOUNTER — Encounter: Payer: Self-pay | Admitting: Internal Medicine

## 2020-06-29 DIAGNOSIS — M85851 Other specified disorders of bone density and structure, right thigh: Secondary | ICD-10-CM

## 2020-06-29 DIAGNOSIS — M85852 Other specified disorders of bone density and structure, left thigh: Secondary | ICD-10-CM | POA: Diagnosis not present

## 2020-07-03 ENCOUNTER — Other Ambulatory Visit: Payer: Self-pay | Admitting: Internal Medicine

## 2020-07-09 ENCOUNTER — Encounter: Payer: Self-pay | Admitting: Internal Medicine

## 2020-07-10 MED ORDER — FUROSEMIDE 40 MG PO TABS: ORAL_TABLET | ORAL | 3 refills | Status: DC

## 2020-07-10 MED ORDER — ROSUVASTATIN CALCIUM 10 MG PO TABS: 10.0000 mg | ORAL_TABLET | Freq: Every day | ORAL | 3 refills | Status: DC

## 2020-07-10 MED ORDER — DULOXETINE HCL 30 MG PO CPEP: 30.0000 mg | ORAL_CAPSULE | Freq: Two times a day (BID) | ORAL | 3 refills | Status: DC

## 2020-07-10 MED ORDER — POTASSIUM CHLORIDE CRYS ER 20 MEQ PO TBCR: 40.0000 meq | EXTENDED_RELEASE_TABLET | Freq: Two times a day (BID) | ORAL | 3 refills | Status: DC

## 2020-07-10 MED ORDER — FAMOTIDINE 20 MG PO TABS: ORAL_TABLET | ORAL | 3 refills | Status: DC

## 2020-07-10 MED ORDER — TRAMADOL HCL 50 MG PO TABS: 50.0000 mg | ORAL_TABLET | Freq: Every day | ORAL | 1 refills | Status: AC

## 2020-07-10 MED ORDER — LISINOPRIL 5 MG PO TABS: 5.0000 mg | ORAL_TABLET | Freq: Every day | ORAL | 3 refills | Status: DC

## 2020-07-10 NOTE — Addendum Note (Signed)
Addended by: [name] on: 07/10/2020 11:56 AM   Modules accepted: Orders

## 2020-07-11 ENCOUNTER — Encounter: Payer: Self-pay | Admitting: Internal Medicine

## 2020-08-06 ENCOUNTER — Ambulatory Visit: Payer: Medicare Other

## 2020-08-09 ENCOUNTER — Encounter: Payer: Self-pay | Admitting: Internal Medicine

## 2020-08-13 ENCOUNTER — Encounter: Payer: Self-pay | Admitting: Internal Medicine

## 2020-08-14 ENCOUNTER — Encounter: Payer: Self-pay | Admitting: Internal Medicine

## 2020-08-15 ENCOUNTER — Encounter: Payer: Self-pay | Admitting: Internal Medicine

## 2020-08-15 MED ORDER — PITAVASTATIN CALCIUM 1 MG PO TABS: 1.0000 mg | ORAL_TABLET | Freq: Every day | ORAL | 5 refills | Status: DC

## 2020-08-16 MED ORDER — PRAVASTATIN SODIUM 10 MG PO TABS: 10.0000 mg | ORAL_TABLET | Freq: Every day | ORAL | 5 refills | Status: DC

## 2020-08-16 NOTE — Telephone Encounter (Signed)
   Patient calling to report the pharmacy has noted she has an allergy to Statin drugs    Please advise regarding pravastatin (PRAVACHOL) 10 MG tablet

## 2020-08-17 ENCOUNTER — Encounter: Payer: Self-pay | Admitting: Internal Medicine

## 2020-08-20 ENCOUNTER — Encounter: Payer: Self-pay | Admitting: Internal Medicine

## 2020-08-20 MED ORDER — EZETIMIBE 10 MG PO TABS: 10.0000 mg | ORAL_TABLET | Freq: Every day | ORAL | 5 refills | Status: DC

## 2020-08-20 NOTE — Addendum Note (Signed)
Addended by: [name] on: 08/20/2020 07:23 PM   Modules accepted: Orders

## 2020-08-22 ENCOUNTER — Encounter: Payer: Self-pay | Admitting: Internal Medicine

## 2020-08-27 ENCOUNTER — Other Ambulatory Visit: Payer: Self-pay

## 2020-08-27 ENCOUNTER — Ambulatory Visit (INDEPENDENT_AMBULATORY_CARE_PROVIDER_SITE_OTHER): Payer: Medicare Other | Admitting: General Practice

## 2020-08-27 DIAGNOSIS — Z8673 Personal history of transient ischemic attack (TIA), and cerebral infarction without residual deficits: Secondary | ICD-10-CM | POA: Diagnosis not present

## 2020-08-27 DIAGNOSIS — Z7901 Long term (current) use of anticoagulants: Secondary | ICD-10-CM | POA: Diagnosis not present

## 2020-08-27 LAB — POCT INR: INR: 2.1 (ref 2.0–3.0)

## 2020-08-27 NOTE — Patient Instructions (Addendum)
Pre visit review using our clinic review tool, if applicable. No additional management support is needed unless otherwise documented below in the visit note.  Continue to take 1 tablet daily.  Re-check in 6 weeks.  

## 2020-08-28 ENCOUNTER — Encounter: Payer: Self-pay | Admitting: Adult Health

## 2020-08-28 ENCOUNTER — Ambulatory Visit (INDEPENDENT_AMBULATORY_CARE_PROVIDER_SITE_OTHER): Payer: Medicare Other | Admitting: Adult Health

## 2020-08-28 VITALS — BP 134/61 | HR 61 | Ht <= 58 in | Wt 202.0 lb

## 2020-08-28 DIAGNOSIS — F4310 Post-traumatic stress disorder, unspecified: Secondary | ICD-10-CM

## 2020-08-28 DIAGNOSIS — F331 Major depressive disorder, recurrent, moderate: Secondary | ICD-10-CM

## 2020-08-28 DIAGNOSIS — F411 Generalized anxiety disorder: Secondary | ICD-10-CM

## 2020-08-28 DIAGNOSIS — F39 Unspecified mood [affective] disorder: Secondary | ICD-10-CM | POA: Diagnosis not present

## 2020-08-28 MED ORDER — LAMOTRIGINE 25 MG PO TABS: ORAL_TABLET | ORAL | 2 refills | Status: DC

## 2020-08-28 NOTE — Progress Notes (Signed)
[facility] MD/PA/NP Initial Note  08/28/2020 2:27 PM Amanda Turner  MRN:  [id number]  Chief Complaint:   HPI:   Referred by PCP - Billey Gosling.  Describes mood today as "not to good". Pleasant. Communicative. Tearful at times.  Mood symptoms - reports depression, anxiety, and irritability. Having outbursts. Mood swings when things don't go well. Husband stating "it is a vicious circle". History of 3 strokes in 2010 - "forgetful". Husband with health issues. Has taken Cymbalta 60mg  for the past 2 years and does not feel like it's been helpful. Is currently taking 30mg  Cymbalta for the next 7 days. Sister taking Lamictal and she would also like to try it. Varying interest and motivation. Taking medications as prescribed.  Energy levels vary - "low". Active, does not have a regular exercise routine.  Enjoys some usual interests and activities. Married. Lives with husband of 72 years. Has 2 grown children.No friends local. Appetite adequate. Weight gain 10 pounds over a few months. Sleeps well most nights. Averages 7 hours of broken sleep. Focus and concentration "sometimes". Completing tasks. Managing some aspects of household. Preparing to sell their house and downsize.  Denies SI or HI.  Denies AH or VH. History of 3 strokes. History of seeing a therapist - once.  Previous medication trials: Cymbalta  Visit Diagnosis:    ICD-10-CM   1. Generalized anxiety disorder  F41.1 lamoTRIgine (LAMICTAL) 25 MG tablet  2. Major depressive disorder, recurrent episode, moderate (HCC)  F33.1 lamoTRIgine (LAMICTAL) 25 MG tablet  3. PTSD (post-traumatic stress disorder)  F43.10 lamoTRIgine (LAMICTAL) 25 MG tablet  4. Episodic mood disorder (HCC)  F39 lamoTRIgine (LAMICTAL) 25 MG tablet    Past Psychiatric History: Denies psychiatric hospitalization.  Past Medical History:  Past Medical History:  Diagnosis Date  . Anxiety   . 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 Depression   . Elevated LFT's   . HA (headache)   . Hyperlipemia   . Hypertension   . Intracranial hemorrhage, spontaneous intraparenchymal, associated with coagulopathy, remote, resolved 06/23/2011  . Lupus anticoagulant disorder ([place])   . Lupus anticoagulant with hypercoagulable state ([place]) 06/23/2011  . Lupus erythematosus 06/23/2011  . Stroke [facility])     Past Surgical History:  Procedure Laterality Date  . ABDOMINAL HYSTERECTOMY    . TRANSPHENOIDAL PITUITARY RESECTION      Family Psychiatric History: Family history of mental illness - anxiety, depression and bipolar disorder.  Family History:  Family History  Problem Relation Age of Onset  . Diabetes Brother   . Diabetes Sister   . Hypertension Mother   . Dementia Mother   . Bladder Cancer Brother   . Colon cancer Neg Hx     Social History:  Social History   Socioeconomic History  . Marital status: Married    Spouse name: Not on file  . Number of children: 2  . Years of education: Not on file  . Highest education level: Not on file  Occupational History    Employer: UNEMPLOYED  Tobacco Use  . Smoking status: Never Smoker  . Smokeless tobacco: Never Used  Substance and Sexual Activity  . Alcohol use: No    Alcohol/week: 0.0 standard drinks  . Drug use: No  . Sexual activity: Not on file  Other Topics Concern  . Not on file  Social History Narrative   2 cups of coffee daily   Retired   Scientist, physiological Strain:  Not on file  Food Insecurity: Not on file  Transportation Needs: Not on file  Physical Activity: Not on file  Stress: Not on file  Social Connections: Not on file    Allergies:  Allergies  Allergen Reactions  . Gabapentin Nausea Only    Dizziness,   . Cymbalta [Duloxetine Hcl] Itching and Nausea Only    headaches  . Lipitor [Atorvastatin]     Instability, dizzy  . Lisinopril Cough  . Losartan Swelling  . Peanut Oil     REACTION: hives  . Shrimp  Flavor     hives  . Simvastatin   . Sulfonamide Derivatives     REACTION: shortness of breath  . Crestor [Rosuvastatin] Other (See Comments)    headaches,  muscle pain, weakness, dizziness, lack of energy,  and constipation  . Pravastatin Itching    Itching all over    Metabolic Disorder Labs: Lab Results  Component Value Date   HGBA1C 5.8 06/12/2020   MPG 120 04/09/2009   Lab Results  Component Value Date   PROLACTIN 8.4 01/26/2009   PROLACTIN 10.5 12/20/2008   Lab Results  Component Value Date   CHOL 196 06/12/2020   TRIG 127.0 06/12/2020   HDL 74.30 06/12/2020   CHOLHDL 3 06/12/2020   VLDL 25.4 06/12/2020   LDLCALC 96 06/12/2020   LDLCALC 72 12/27/2019   Lab Results  Component Value Date   TSH 0.78 06/12/2020   TSH 0.826 04/05/2018    Therapeutic Level Labs: No results found for: LITHIUM No results found for: VALPROATE No components found for:  CBMZ  Current Medications: Current Outpatient Medications  Medication Sig Dispense Refill  . lamoTRIgine (LAMICTAL) 25 MG tablet Take one tablet (25mg ) at bedtime for 2 weeks, then increase to two tablets (50mg ) at bedtime for 2 weeks. 60 tablet 2  . ALPRAZolam (XANAX) 0.5 MG tablet TAKE 1/2 TABLET BY MOUTH IN THE MORNING AND 1 TABLET BY MOUTH IN THE EVENING 135 tablet 0  . aspirin 81 MG tablet Take 81 mg by mouth daily.    . clotrimazole (LOTRIMIN) 1 % external solution Apply 1 application topically at bedtime. In between toes 60 mL 5  . DULoxetine (CYMBALTA) 30 MG capsule Take 1 capsule (30 mg total) by mouth 2 (two) times daily. 180 capsule 3  . Ergocalciferol (VITAMIN D2) 50 MCG (2000 UT) TABS Take 2,000 Units by mouth daily. 30 tablet   . ezetimibe (ZETIA) 10 MG tablet Take 1 tablet (10 mg total) by mouth daily. 30 tablet 5  . famotidine (PEPCID) 20 MG tablet TAKE 1 TABLET BY MOUTH  TWICE DAILY ANNUAL 180 tablet 3  . furosemide (LASIX) 40 MG tablet TAKE 1 TABLET BY MOUTH  TWICE DAILY 180 tablet 3  . lisinopril  (ZESTRIL) 5 MG tablet Take 1 tablet (5 mg total) by mouth daily. 90 tablet 3  . potassium chloride SA (KLOR-CON) 20 MEQ tablet Take 2 tablets (40 mEq total) by mouth 2 (two) times daily. 360 tablet 3  . topiramate (TOPAMAX) 25 MG tablet Take 25 mg by mouth 2 (two) times daily.    . traMADol (ULTRAM) 50 MG tablet Take 1 tablet (50 mg total) by mouth daily. 90 tablet 1  . warfarin (COUMADIN) 5 MG tablet TAKE 1 TABLET BY MOUTH  DAILY OR AS DIRECTED BY  ANTICOAGULATION CLINIC 100 tablet 3   No current facility-administered medications for this visit.    Medication Side Effects: none  Orders placed this visit:  No orders  of the defined types were placed in this encounter.   Psychiatric Specialty Exam:  Review of Systems  Blood pressure 134/61, pulse 61, height 4\' 5"  (1.346 m), weight 202 lb (91.6 kg).Body mass index is 50.56 kg/m.  General Appearance: Casual and Neat  Eye Contact:  Good  Speech:  Clear and Coherent and Normal Rate  Volume:  Normal  Mood:  Anxious, Depressed and Irritable  Affect:  Appropriate and Congruent  Thought Process:  Coherent and Descriptions of Associations: Intact  Orientation:  Full (Time, Place, and Person)  Thought Content: Logical   Suicidal Thoughts:  No  Homicidal Thoughts:  No  Memory:  WNL  Judgement:  Good  Insight:  Good  Psychomotor Activity:  Normal  Concentration:  Concentration: Good  Recall:  Good  Fund of Knowledge: Good  Language: Good  Assets:  Communication Skills Desire for Improvement Financial Resources/Insurance Housing Intimacy Leisure Time Physical Health Resilience Social Support Talents/Skills Transportation Vocational/Educational  ADL's:  Intact  Cognition: WNL  Prognosis:  Good   Screenings:  GAD-7   Flowsheet Row Office Visit from 06/06/2019 in [place] at [facility]  Total GAD-7 Score [place] Office Visit from 07/14/2018 in [facility] Office Visit  from 04/05/2018 in [facility]  Total Score (max 30 points ) 28 27    PHQ2-9   [place] Visit from 06/13/2020 in [place] at [facility] Visit from 06/06/2019 in [place] at [facility] Visit from 02/24/2018 in [place] from 09/10/2017 in [place] Visit from 02/04/2017 in [place]  PHQ-2 Total Score 1 4 0 1 1  PHQ-9 Total Score 3 14 0 2 --      Receiving Psychotherapy: No   Treatment Plan/Recommendations:   Plan:  PDMP reviewed  1. Cymbalta 30mg  daily x 7 days, then d/c. 2. Add Lamictal 25mg  daily X 14 days, then increase to 50mg  daily.   Time spent with patient was 60 minutes. Greater than 50% of face to face time with patient was spent on counseling and coordination of care.  RTC 4 weeks  Patient advised to contact office with any questions, adverse effects, or acute worsening in signs and symptoms.  Counseled patient regarding potential benefits, risks, and side effects of Lamictal to include potential risk of Stevens-Johnson syndrome. Advised patient to stop taking Lamictal and contact office immediately if rash develops and to seek urgent medical attention if rash is severe and/or spreading quickly.      Aloha Gell, NP

## 2020-09-03 ENCOUNTER — Encounter: Payer: Self-pay | Admitting: Internal Medicine

## 2020-09-07 ENCOUNTER — Telehealth: Payer: Self-pay | Admitting: Adult Health

## 2020-09-07 ENCOUNTER — Encounter: Payer: Self-pay | Admitting: Internal Medicine

## 2020-09-07 NOTE — Telephone Encounter (Signed)
She can, but the nightmares should be temporary.

## 2020-09-07 NOTE — Telephone Encounter (Signed)
Pt called to report she has night mares since stopping Cymbalta and is asking if she can take Xanax with Lamotrigine as needed at night to help with nightmares. Contact @ [phone]

## 2020-09-11 ENCOUNTER — Encounter: Payer: Self-pay | Admitting: Internal Medicine

## 2020-09-11 NOTE — Telephone Encounter (Signed)
Noted  

## 2020-09-11 NOTE — Telephone Encounter (Signed)
I have been trying to reach her.LVM

## 2020-09-18 ENCOUNTER — Telehealth: Payer: Self-pay | Admitting: Adult Health

## 2020-09-18 NOTE — Telephone Encounter (Signed)
Pt called and said that the medicine you prescribed is not working. She said that she feels worse. She needs to know what to do if she needs to wean off the medicine and try something new. Please give her a call back at [phone]

## 2020-09-20 ENCOUNTER — Telehealth: Payer: Self-pay | Admitting: Adult Health

## 2020-09-20 NOTE — Telephone Encounter (Signed)
She called again but will not answer my call.Please advise

## 2020-09-20 NOTE — Telephone Encounter (Signed)
Pt called reporting Lamotrigine is not working. How to wean off and try another med. Stated she left message and never received  call back. [phone]

## 2020-09-20 NOTE — Telephone Encounter (Signed)
I have been trying to reach pt for several days now,LVM

## 2020-09-20 NOTE — Telephone Encounter (Signed)
If pt calls again please transfer to me.I have been trying to call her for 2 days,at least 3 times a day and she will not answer.Even left voicemail's but she stated no one has returned her call.

## 2020-09-20 NOTE — Telephone Encounter (Signed)
Discussed.

## 2020-09-20 NOTE — Telephone Encounter (Signed)
Noted  

## 2020-09-25 ENCOUNTER — Ambulatory Visit: Payer: Medicare Other | Admitting: Adult Health

## 2020-09-26 ENCOUNTER — Other Ambulatory Visit: Payer: Self-pay

## 2020-09-26 ENCOUNTER — Encounter: Payer: Self-pay | Admitting: Internal Medicine

## 2020-09-26 ENCOUNTER — Ambulatory Visit (INDEPENDENT_AMBULATORY_CARE_PROVIDER_SITE_OTHER): Payer: Medicare Other | Admitting: Adult Health

## 2020-09-26 ENCOUNTER — Encounter: Payer: Self-pay | Admitting: Adult Health

## 2020-09-26 DIAGNOSIS — F4310 Post-traumatic stress disorder, unspecified: Secondary | ICD-10-CM | POA: Diagnosis not present

## 2020-09-26 DIAGNOSIS — E782 Mixed hyperlipidemia: Secondary | ICD-10-CM

## 2020-09-26 DIAGNOSIS — F39 Unspecified mood [affective] disorder: Secondary | ICD-10-CM

## 2020-09-26 DIAGNOSIS — F411 Generalized anxiety disorder: Secondary | ICD-10-CM

## 2020-09-26 DIAGNOSIS — F331 Major depressive disorder, recurrent, moderate: Secondary | ICD-10-CM

## 2020-09-26 MED ORDER — ARIPIPRAZOLE 2 MG PO TABS: 2.0000 mg | ORAL_TABLET | Freq: Every day | ORAL | 2 refills | Status: DC

## 2020-09-26 NOTE — Progress Notes (Signed)
Amanda Turner [id number] [birth year] 75 y.o.  Subjective:   Patient ID:  Amanda Turner is a 75 y.o. (DOB 16-May-1945) female.  Chief Complaint: No chief complaint on file.   HPI   Accompanied by husband.   Amanda Turner presents to the office today for follow-up of Mood disorder, MDD, GAD, and PTSD.   Describes mood today as "no different". Pleasant. Communicative. Tearful at times.  Mood symptoms - reports depression, anxiety, and irritability. Having outbursts. Mood swings - will be fine for a while, then it goes back and forth. Husband "frustrated" with her yelling at him. Husband feels like they need marriage counseling. History of 3 strokes in 2010 - "forgetful". Has tapered off the Cymbalta. Trialed the Lamictal over the past month and does not feel like it has been helpful - made her jittery and depressed. Was getting scared easily - about being alone and trapped. Would like to stop it and try something different. Decreased interest and motivation. Taking medications as prescribed.  Energy levels stable. Active, does not have a regular exercise routine.  Enjoys some usual interests and activities. Married. Lives with husband of 69 years. Has 2 grown children.No friends local. Appetite adequate. Weight gain with Lamictal. Sleeping difficulties. Averages 6 hours of broken sleep. Waking up during the night.  Not getting to sleep until 3 in the morning. Focus and concentration difficulties "sometimes". Completing tasks. Managing some aspects of household. Preparing to sell their house and downsize.  Denies SI or HI.  Denies AH or VH. History of 3 strokes. History of seeing a therapist.   Previous medication trials: Cymbalta, Lamictal  GAD-7   Flowsheet Row Office Visit from 06/06/2019 in [place] at [facility]  Total GAD-7 Score [place] Office Visit from 07/14/2018 in [facility] Office Visit from 04/05/2018 in [facility]  Total Score (max 30 points ) 28 27    PHQ2-9   [place] Visit from 06/13/2020 in [place] at [place] from 06/06/2019 in [place] at [facility] Visit from 02/24/2018 in [place] Visit from 09/10/2017 in [place] Visit from 02/04/2017 in [facility]  PHQ-2 Total Score 1 4 0 1 1  PHQ-9 Total Score 3 14 0 2 --       Review of Systems:  Review of Systems  Musculoskeletal: Negative for gait problem.  Neurological: Negative for tremors.  Psychiatric/Behavioral:       Please refer to HPI    Medications: I have reviewed the patient's current medications.  Current Outpatient Medications  Medication Sig Dispense Refill  . ARIPiprazole (ABILIFY) 2 MG tablet Take 1 tablet (2 mg total) by mouth daily. 30 tablet 2  . ALPRAZolam (XANAX) 0.5 MG tablet TAKE 1/2 TABLET BY MOUTH IN THE MORNING AND 1 TABLET BY MOUTH IN THE EVENING 135 tablet 0  . aspirin 81 MG tablet Take 81 mg by mouth daily.    . clotrimazole (LOTRIMIN) 1 % external solution Apply 1 application topically at bedtime. In between toes 60 mL 5  . DULoxetine (CYMBALTA) 30 MG capsule Take 1 capsule (30 mg total) by mouth 2 (two) times daily. 180 capsule 3  . Ergocalciferol (VITAMIN D2) 50 MCG (2000 UT) TABS Take 2,000 Units by mouth daily. 30 tablet   . ezetimibe (ZETIA) 10 MG tablet Take 1 tablet (10 mg total) by mouth daily.  30 tablet 5  . famotidine (PEPCID) 20 MG tablet TAKE 1 TABLET BY MOUTH  TWICE DAILY ANNUAL 180 tablet 3  . furosemide (LASIX) 40 MG tablet TAKE 1 TABLET BY MOUTH  TWICE DAILY 180 tablet 3  . lamoTRIgine (LAMICTAL) 25 MG tablet Take one tablet (75m) at bedtime for 2 weeks, then increase to two tablets (556m at bedtime for 2 weeks. 60 tablet 2  . lisinopril (ZESTRIL) 5 MG tablet Take 1 tablet (5 mg total) by mouth daily. 90 tablet 3  . potassium  chloride SA (KLOR-CON) 20 MEQ tablet Take 2 tablets (40 mEq total) by mouth 2 (two) times daily. 360 tablet 3  . topiramate (TOPAMAX) 25 MG tablet Take 25 mg by mouth 2 (two) times daily.    . traMADol (ULTRAM) 50 MG tablet Take 1 tablet (50 mg total) by mouth daily. 90 tablet 1  . warfarin (COUMADIN) 5 MG tablet TAKE 1 TABLET BY MOUTH  DAILY OR AS DIRECTED BY  ANTICOAGULATION CLINIC 100 tablet 3   No current facility-administered medications for this visit.    Medication Side Effects: None  Allergies:  Allergies  Allergen Reactions  . Gabapentin Nausea Only    Dizziness,   . Cymbalta [Duloxetine Hcl] Itching and Nausea Only    headaches  . Lipitor [Atorvastatin]     Instability, dizzy  . Lisinopril Cough  . Losartan Swelling  . Peanut Oil     REACTION: hives  . Shrimp Flavor     hives  . Simvastatin   . Sulfonamide Derivatives     REACTION: shortness of breath  . Crestor [Rosuvastatin] Other (See Comments)    headaches,  muscle pain, weakness, dizziness, lack of energy,  and constipation  . Pravastatin Itching    Itching all over    Past Medical History:  Diagnosis Date  . Anxiety   . 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 Depression   . Elevated LFT's   . HA (headache)   . Hyperlipemia   . Hypertension   . Intracranial hemorrhage, spontaneous intraparenchymal, associated with coagulopathy, remote, resolved 06/23/2011  . Lupus anticoagulant disorder ([facility]  . Lupus anticoagulant with hypercoagulable state ([facility]18/2013  . Lupus erythematosus 06/23/2011  . [facility]    Past Medical History, Surgical history, Social history, and Family history were reviewed and updated as appropriate.   Please see review of systems for further details on the patient's review from today.   Objective:   Physical Exam:  There were no vitals taken for this visit.  Physical Exam Constitutional:      General: She is not in acute  distress. Musculoskeletal:        General: No deformity.  Neurological:     Mental Status: She is alert and oriented to person, place, and time.     Coordination: Coordination normal.  Psychiatric:        Attention and Perception: Attention and perception normal. She does not perceive auditory or visual hallucinations.        Mood and Affect: Mood normal. Mood is not anxious or depressed. Affect is not labile, blunt, angry or inappropriate.        Speech: Speech normal.        Behavior: Behavior normal.        Thought Content: Thought content normal. Thought content is not paranoid or delusional. Thought content does not include homicidal or suicidal ideation. Thought content does not include homicidal  or suicidal plan.        Cognition and Memory: Cognition and memory normal.        Judgment: Judgment normal.     Comments: Insight intact     Lab Review:     Component Value Date/Time   NA 139 06/12/2020 1532   NA 142 02/08/2013 1455   K 3.7 06/12/2020 1532   K 4.0 02/08/2013 1455   CL 104 06/12/2020 1532   CL 104 07/22/2012 1426   CO2 26 06/12/2020 1532   CO2 26 02/08/2013 1455   GLUCOSE 111 (H) 06/12/2020 1532   GLUCOSE 105 02/08/2013 1455   GLUCOSE 129 (H) 07/22/2012 1426   BUN 18 06/12/2020 1532   BUN 12.2 02/08/2013 1455   CREATININE 0.89 06/12/2020 1532   CREATININE 0.8 02/08/2013 1455   CALCIUM 9.1 06/12/2020 1532   CALCIUM 9.2 02/08/2013 1455   PROT 7.1 06/12/2020 1532   PROT 7.8 02/08/2013 1455   ALBUMIN 4.1 06/12/2020 1532   ALBUMIN 3.6 02/08/2013 1455   AST 18 06/12/2020 1532   AST 18 02/08/2013 1455   ALT 23 06/12/2020 1532   ALT 24 02/08/2013 1455   ALKPHOS 78 06/12/2020 1532   ALKPHOS 93 02/08/2013 1455   BILITOT 0.6 06/12/2020 1532   BILITOT 0.45 02/08/2013 1455   GFRNONAA 89.74 08/06/2009 1159   GFRAA  04/19/2009 0612    >60        The eGFR has been calculated using the MDRD equation. This calculation has not been validated in all  clinical situations. eGFR's persistently <60 mL/min signify possible Chronic Kidney Disease.       Component Value Date/Time   WBC 5.7 06/12/2020 1532   RBC 4.57 06/12/2020 1532   HGB 14.7 06/12/2020 1532   HGB 15.0 02/08/2013 1455   HCT 42.7 06/12/2020 1532   HCT 44.7 02/08/2013 1455   PLT 227.0 06/12/2020 1532   PLT 232 02/08/2013 1455   MCV 93.4 06/12/2020 1532   MCV 92.5 02/08/2013 1455   MCH 31.0 11/16/2014 1624   MCHC 34.4 06/12/2020 1532   RDW 12.6 06/12/2020 1532   RDW 12.6 02/08/2013 1455   LYMPHSABS 1.8 06/12/2020 1532   LYMPHSABS 1.7 02/08/2013 1455   MONOABS 0.6 06/12/2020 1532   MONOABS 0.5 02/08/2013 1455   EOSABS 0.1 06/12/2020 1532   EOSABS 0.1 02/08/2013 1455   BASOSABS 0.1 06/12/2020 1532   BASOSABS 0.1 02/08/2013 1455    No results found for: POCLITH, LITHIUM   No results found for: PHENYTOIN, PHENOBARB, VALPROATE, CBMZ   .res Assessment: Plan:     Plan:  PDMP reviewed  1. D/C Lamictal - 48m daily x 7 days. 2. Add Abilify 269mdaily 3. Continue Xanax 0.98m61mID  RTC 4 weeks  Patient advised to contact office with any questions, adverse effects, or acute worsening in signs and symptoms.  Discussed potential metabolic side effects associated with atypical antipsychotics, as well as potential risk for movement side effects. Advised pt to contact office if movement side effects occur.   Diagnoses and all orders for this visit:  Episodic mood disorder (HCC) -     ARIPiprazole (ABILIFY) 2 MG tablet; Take 1 tablet (2 mg total) by mouth daily.  PTSD (post-traumatic stress disorder) -     ARIPiprazole (ABILIFY) 2 MG tablet; Take 1 tablet (2 mg total) by mouth daily.  Generalized anxiety disorder -     ARIPiprazole (ABILIFY) 2 MG tablet; Take 1 tablet (2 mg total) by mouth daily.  Major depressive disorder, recurrent episode, moderate (HCC) -     ARIPiprazole (ABILIFY) 2 MG tablet; Take 1 tablet (2 mg total) by mouth daily.    y Please  see After Visit Summary for patient specific instructions.  Future Appointments  Date Time Provider [place]  10/08/2020  3:00 PM [facility] PEC  10/24/2020  3:40 PM Elynore Dolinski, Berdie Ogren, NP CP-CP None  11/13/2020  2:00 PM Warren Danes, PA-C [facility]  12/11/2020  3:20 PM Burns, Claudina Lick, MD [facility] None    No orders of the defined types were placed in this encounter.   -------------------------------

## 2020-10-02 ENCOUNTER — Ambulatory Visit: Payer: Medicare Other | Admitting: Physician Assistant

## 2020-10-03 ENCOUNTER — Encounter: Payer: Self-pay | Admitting: Internal Medicine

## 2020-10-03 ENCOUNTER — Telehealth: Payer: Self-pay | Admitting: Adult Health

## 2020-10-03 NOTE — Telephone Encounter (Signed)
Asking for dose increase?

## 2020-10-03 NOTE — Telephone Encounter (Signed)
Let's give it one more week and then if no improvement, we can increase.

## 2020-10-03 NOTE — Telephone Encounter (Signed)
Rtc to pt and given information to give more time. She then asks for something else to take until the Abilify kicks in. I explained to her that nothing is quick acting for depression. Informed her I would check with Barnett Applebaum for any recommendation.

## 2020-10-03 NOTE — Telephone Encounter (Signed)
On Abilify 2mg  for 7 days, and not noticing any difference. Can she increase this dose? Or try another medication?

## 2020-10-04 ENCOUNTER — Other Ambulatory Visit (INDEPENDENT_AMBULATORY_CARE_PROVIDER_SITE_OTHER): Payer: Medicare Other

## 2020-10-04 ENCOUNTER — Other Ambulatory Visit: Payer: Self-pay

## 2020-10-04 DIAGNOSIS — Z8673 Personal history of transient ischemic attack (TIA), and cerebral infarction without residual deficits: Secondary | ICD-10-CM | POA: Diagnosis not present

## 2020-10-04 DIAGNOSIS — E782 Mixed hyperlipidemia: Secondary | ICD-10-CM | POA: Diagnosis not present

## 2020-10-04 DIAGNOSIS — R7303 Prediabetes: Secondary | ICD-10-CM

## 2020-10-04 LAB — CBC WITH DIFFERENTIAL/PLATELET
Basophils Absolute: 0.1 10*3/uL (ref 0.0–0.1)
Basophils Relative: 1.3 % (ref 0.0–3.0)
Eosinophils Absolute: 0.1 10*3/uL (ref 0.0–0.7)
Eosinophils Relative: 2.5 % (ref 0.0–5.0)
HCT: 43.3 % (ref 36.0–46.0)
Hemoglobin: 14.5 g/dL (ref 12.0–15.0)
Lymphocytes Relative: 35.7 % (ref 12.0–46.0)
Lymphs Abs: 2.1 10*3/uL (ref 0.7–4.0)
MCHC: 33.6 g/dL (ref 30.0–36.0)
MCV: 94.5 fl (ref 78.0–100.0)
Monocytes Absolute: 0.6 10*3/uL (ref 0.1–1.0)
Monocytes Relative: 10.4 % (ref 3.0–12.0)
Neutro Abs: 3 10*3/uL (ref 1.4–7.7)
Neutrophils Relative %: 50.1 % (ref 43.0–77.0)
Platelets: 242 10*3/uL (ref 150.0–400.0)
RBC: 4.58 Mil/uL (ref 3.87–5.11)
RDW: 12.9 % (ref 11.5–15.5)
WBC: 5.9 10*3/uL (ref 4.0–10.5)

## 2020-10-04 LAB — HEMOGLOBIN A1C: Hgb A1c MFr Bld: 5.7 % (ref 4.6–6.5)

## 2020-10-04 NOTE — Telephone Encounter (Signed)
Left pt message with information to continue as directed for at least 5-7 days

## 2020-10-04 NOTE — Telephone Encounter (Signed)
Noted  

## 2020-10-05 LAB — HEPATIC FUNCTION PANEL
ALT: 31 U/L (ref 0–35)
AST: 22 U/L (ref 0–37)
Albumin: 4 g/dL (ref 3.5–5.2)
Alkaline Phosphatase: 87 U/L (ref 39–117)
Bilirubin, Direct: 0 mg/dL (ref 0.0–0.3)
Total Bilirubin: 0.5 mg/dL (ref 0.2–1.2)
Total Protein: 7 g/dL (ref 6.0–8.3)

## 2020-10-05 LAB — COMPREHENSIVE METABOLIC PANEL
ALT: 31 U/L (ref 0–35)
AST: 22 U/L (ref 0–37)
Albumin: 4 g/dL (ref 3.5–5.2)
Alkaline Phosphatase: 87 U/L (ref 39–117)
BUN: 14 mg/dL (ref 6–23)
CO2: 27 mEq/L (ref 19–32)
Calcium: 9.2 mg/dL (ref 8.4–10.5)
Chloride: 104 mEq/L (ref 96–112)
Creatinine, Ser: 0.77 mg/dL (ref 0.40–1.20)
GFR: 75.96 mL/min (ref >60.00)
Glucose, Bld: 99 mg/dL (ref 70–99)
Potassium: 4.2 mEq/L (ref 3.5–5.1)
Sodium: 140 mEq/L (ref 135–145)
Total Bilirubin: 0.5 mg/dL (ref 0.2–1.2)
Total Protein: 7 g/dL (ref 6.0–8.3)

## 2020-10-05 LAB — LIPID PANEL
Cholesterol: 241 mg/dL — ABNORMAL HIGH (ref 0–200)
HDL: 66.1 mg/dL (ref >39.00)
LDL Cholesterol: 145 mg/dL — ABNORMAL HIGH (ref 0–99)
NonHDL: 174.77
Total CHOL/HDL Ratio: 4
Triglycerides: 149 mg/dL (ref 0.0–149.0)
VLDL: 29.8 mg/dL (ref 0.0–40.0)

## 2020-10-06 ENCOUNTER — Encounter: Payer: Self-pay | Admitting: Internal Medicine

## 2020-10-08 ENCOUNTER — Other Ambulatory Visit: Payer: Self-pay

## 2020-10-08 ENCOUNTER — Ambulatory Visit (INDEPENDENT_AMBULATORY_CARE_PROVIDER_SITE_OTHER): Payer: Medicare Other | Admitting: General Practice

## 2020-10-08 ENCOUNTER — Encounter: Payer: Self-pay | Admitting: Internal Medicine

## 2020-10-08 DIAGNOSIS — Z7901 Long term (current) use of anticoagulants: Secondary | ICD-10-CM | POA: Diagnosis not present

## 2020-10-08 DIAGNOSIS — Z8673 Personal history of transient ischemic attack (TIA), and cerebral infarction without residual deficits: Secondary | ICD-10-CM

## 2020-10-08 LAB — POCT INR: INR: 2 (ref 2.0–3.0)

## 2020-10-08 NOTE — Patient Instructions (Addendum)
Pre visit review using our clinic review tool, if applicable. No additional management support is needed unless otherwise documented below in the visit note.  Take 1 1/2 tablets today (6/6) and then change dosage and take 1 tablet daily except take 1 1/2 tablets every Monday.  Re-check in 3 weeks.

## 2020-10-09 ENCOUNTER — Encounter: Payer: Self-pay | Admitting: Internal Medicine

## 2020-10-09 DIAGNOSIS — R7303 Prediabetes: Secondary | ICD-10-CM

## 2020-10-09 DIAGNOSIS — E782 Mixed hyperlipidemia: Secondary | ICD-10-CM

## 2020-10-09 MED ORDER — PITAVASTATIN CALCIUM 1 MG PO TABS: 1.0000 mg | ORAL_TABLET | Freq: Every day | ORAL | 5 refills | Status: DC

## 2020-10-10 ENCOUNTER — Telehealth: Payer: Self-pay | Admitting: Adult Health

## 2020-10-10 ENCOUNTER — Encounter: Payer: Self-pay | Admitting: Internal Medicine

## 2020-10-10 ENCOUNTER — Other Ambulatory Visit: Payer: Self-pay | Admitting: Internal Medicine

## 2020-10-10 NOTE — Telephone Encounter (Signed)
Please review

## 2020-10-10 NOTE — Telephone Encounter (Signed)
Have her go ahead and break it in half for 3 days and leave it off. If she has not tried Zoloft - send in a 50mg  - 1/2 tablet daily for 7 days, then increase to 50mg  daily.

## 2020-10-10 NOTE — Telephone Encounter (Signed)
Next visit is 10/24/20. Amanda Turner called and said that she has been on the Abilify for two weeks and it is not helping her depression. She would like to see if she can try something else. Her phone number is [phone].   [id number] - [place], [place] - [address] Phone:  [phone]  Fax:  [phone]

## 2020-10-11 ENCOUNTER — Other Ambulatory Visit: Payer: Self-pay

## 2020-10-11 DIAGNOSIS — F39 Unspecified mood [affective] disorder: Secondary | ICD-10-CM

## 2020-10-11 DIAGNOSIS — F4310 Post-traumatic stress disorder, unspecified: Secondary | ICD-10-CM

## 2020-10-11 DIAGNOSIS — F411 Generalized anxiety disorder: Secondary | ICD-10-CM

## 2020-10-11 DIAGNOSIS — F331 Major depressive disorder, recurrent, moderate: Secondary | ICD-10-CM

## 2020-10-11 DIAGNOSIS — Z1231 Encounter for screening mammogram for malignant neoplasm of breast: Secondary | ICD-10-CM | POA: Diagnosis not present

## 2020-10-11 LAB — HM MAMMOGRAPHY

## 2020-10-11 MED ORDER — LOVASTATIN 20 MG PO TABS: 20.0000 mg | ORAL_TABLET | ORAL | 3 refills | Status: DC

## 2020-10-11 MED ORDER — ARIPIPRAZOLE 2 MG PO TABS: 3.0000 mg | ORAL_TABLET | Freq: Every day | ORAL | 0 refills | Status: DC

## 2020-10-11 MED ORDER — POTASSIUM CHLORIDE CRYS ER 20 MEQ PO TBCR: 40.0000 meq | EXTENDED_RELEASE_TABLET | Freq: Two times a day (BID) | ORAL | 3 refills | Status: DC

## 2020-10-11 NOTE — Progress Notes (Deleted)
Pt returned call

## 2020-10-11 NOTE — Telephone Encounter (Signed)
Noted  

## 2020-10-11 NOTE — Telephone Encounter (Signed)
LVM letting her know she can increase and asked to call office if she needs a new Rx

## 2020-10-11 NOTE — Telephone Encounter (Signed)
Pt returned call. She has taken Zoloft and it did not work for her. Pt asking if she can increase dose of Abilify to 3 mg (1.5) pill.

## 2020-10-11 NOTE — Telephone Encounter (Signed)
Ok to increase dose.

## 2020-10-11 NOTE — Telephone Encounter (Signed)
Pt returned call. She has taken Zoloft and it did not work for her. Pt asking if she can increase dose of Abilify to 3 mg (1.5) pill. Return call [phone]

## 2020-10-11 NOTE — Telephone Encounter (Signed)
LVM to call office and a my chart message with info.I have had problems with this patient before not answering her phone.

## 2020-10-11 NOTE — Addendum Note (Signed)
Addended by: [name] on: 10/11/2020 07:10 AM   Modules accepted: Orders

## 2020-10-19 ENCOUNTER — Encounter: Payer: Self-pay | Admitting: Internal Medicine

## 2020-10-19 NOTE — Progress Notes (Unsigned)
Outside notes received. Information abstracted. Notes sent to scan.  

## 2020-10-23 ENCOUNTER — Other Ambulatory Visit: Payer: Self-pay

## 2020-10-23 DIAGNOSIS — F4310 Post-traumatic stress disorder, unspecified: Secondary | ICD-10-CM

## 2020-10-23 DIAGNOSIS — F39 Unspecified mood [affective] disorder: Secondary | ICD-10-CM

## 2020-10-23 DIAGNOSIS — F411 Generalized anxiety disorder: Secondary | ICD-10-CM

## 2020-10-23 DIAGNOSIS — F331 Major depressive disorder, recurrent, moderate: Secondary | ICD-10-CM

## 2020-10-23 MED ORDER — ARIPIPRAZOLE 2 MG PO TABS: 3.0000 mg | ORAL_TABLET | Freq: Every day | ORAL | 0 refills | Status: DC

## 2020-10-24 ENCOUNTER — Encounter: Payer: Self-pay | Admitting: Internal Medicine

## 2020-10-24 ENCOUNTER — Ambulatory Visit: Payer: Medicare Other | Admitting: Adult Health

## 2020-10-24 ENCOUNTER — Other Ambulatory Visit: Payer: Self-pay

## 2020-10-24 ENCOUNTER — Encounter: Payer: Self-pay | Admitting: Adult Health

## 2020-10-24 DIAGNOSIS — F39 Unspecified mood [affective] disorder: Secondary | ICD-10-CM | POA: Diagnosis not present

## 2020-10-24 DIAGNOSIS — F411 Generalized anxiety disorder: Secondary | ICD-10-CM

## 2020-10-24 DIAGNOSIS — F331 Major depressive disorder, recurrent, moderate: Secondary | ICD-10-CM

## 2020-10-24 DIAGNOSIS — F4310 Post-traumatic stress disorder, unspecified: Secondary | ICD-10-CM

## 2020-10-24 NOTE — Progress Notes (Signed)
Amanda Turner [id number] 07/04/1945 75 y.o.  Subjective:   Patient ID:  Amanda Turner is a 75 y.o. (DOB 1945/10/29) female.  Chief Complaint: No chief complaint on file.   HPI Sahithi Dejarnett presents to the office today for follow-up of Mood disorder, MDD, GAD, and PTSD.   Describes mood today as "better". Pleasant. Tearful at times. Mood symptoms - denies depression, anxiety, and irritability. Denies outbursts. Denies mood swings. Feels like addition of Abilify has been helpful. Feels like the Abilify may be effecting her speech. Has trouble getting words out sometimes - takes 1/2 tablet Xanax - 0.73m daily as needed. Husband feels like she is doing better. Improved interest and motivation. Taking medications as prescribed.  Energy levels stable. Active, does not have a regular exercise routine.  Enjoys some usual interests and activities. Married. Lives with husband of 543years. Has 2 grown children.No friends local. Appetite adequate. Weight gain - 10 pounds over past few months - 200 pounds - clothes still fitting. Sleeping difficulties. Averages 8 hours - "sleeping very well".  Focus and concentration stable.Completing tasks. Managing some aspects of household. Preparing to sell home their home and downsizing.  Denies SI or HI.  Denies AH or VH. History of 3 strokes. History of seeing a therapist.   Previous medication trials: Cymbalta, Lamictal   GAD-7    Flowsheet Row [facility] G[facility]-7 [place] [facility]  Total Score (max 30 points ) 28 27      PHQ2-9    [place]/2020 [facility] [place] [facility] [place] from 02/24/2018 in [place] [place]/10[place]2017 [facility]  PHQ-2 Total Score 1 4 0 1 1  PHQ-9 Total Score 3 14 0 2 --        Review of Systems:  Review of Systems  Musculoskeletal:  Negative for gait problem.  Neurological:  Negative for tremors.  Psychiatric/Behavioral:         Please refer to HPI   Medications: I have reviewed the patient's current medications.  Current Outpatient Medications  Medication Sig Dispense Refill   ALPRAZolam (XANAX) 0.5 MG tablet TAKE 1/2 TABLET BY MOUTH IN THE MORNING AND 1 TABLET BY MOUTH IN THE EVENING 135 tablet 0   ARIPiprazole (ABILIFY) 2 MG tablet Take 1.5 tablets (3 mg total) by mouth daily. 45 tablet 0   aspirin 81 MG tablet Take 81 mg by mouth daily.     clotrimazole (LOTRIMIN) 1 % external solution Apply 1 application topically at bedtime. In between toes 60 mL 5   DULoxetine (CYMBALTA) 30 MG capsule Take 1 capsule (30 mg total) by mouth 2 (two) times daily. 180 capsule 3   Ergocalciferol (VITAMIN D2) 50 MCG (2000 UT) TABS Take 2,000 Units by mouth daily. 30 tablet    famotidine (PEPCID) 20 MG tablet TAKE 1 TABLET BY MOUTH  TWICE DAILY ANNUAL 180 tablet 3   furosemide (LASIX) 40 MG tablet TAKE 1 TABLET BY MOUTH  TWICE DAILY 180 tablet 3   lamoTRIgine (LAMICTAL) 25 MG tablet Take one tablet (22m at bedtime for 2 weeks, then increase to  two tablets (15m) at bedtime for 2 weeks. 60 tablet 2   lisinopril (ZESTRIL) 5 MG tablet Take 1 tablet (5 mg total) by mouth daily. 90 tablet 3   lovastatin (MEVACOR) 20 MG tablet Take 1 tablet (20 mg total) by mouth 3 (three) times a week. 36 tablet 3   potassium chloride SA (KLOR-CON) 20 MEQ tablet Take 2 tablets (40 mEq total) by mouth 2 (two) times daily. 360 tablet 3   topiramate (TOPAMAX) 25 MG tablet Take 25 mg by mouth 2 (two) times daily.     traMADol (ULTRAM) 50 MG tablet Take 1 tablet (50 mg total) by mouth daily. 90  tablet 1   warfarin (COUMADIN) 5 MG tablet TAKE 1 TABLET BY MOUTH  DAILY OR AS DIRECTED BY  ANTICOAGULATION CLINIC 100 tablet 3   No current facility-administered medications for this visit.    Medication Side Effects: None  Allergies:  Allergies  Allergen Reactions   Gabapentin Nausea Only    Dizziness,    Cymbalta [Duloxetine Hcl] Itching and Nausea Only    headaches   Lipitor [Atorvastatin]     Instability, dizzy   Lisinopril Cough   Losartan Swelling   Peanut Oil     REACTION: hives   Shrimp Flavor     hives   Simvastatin    Sulfonamide Derivatives     REACTION: shortness of breath   Zetia [Ezetimibe]    Crestor [Rosuvastatin] Other (See Comments)    headaches,  muscle pain, weakness, dizziness, lack of energy,  and constipation   Pravastatin Itching    Itching all over    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   Please see review of systems for further details on the patient's review from today.   Objective:   Physical Exam:  There were no vitals taken for this visit.  Physical Exam Constitutional:      General: She is not in acute distress. Musculoskeletal:        General: No deformity.  Neurological:     Mental Status: She is alert and oriented to person, place, and time.     Coordination: Coordination normal.  Psychiatric:        Attention and Perception: Attention and perception normal. She does  not perceive auditory or visual hallucinations.        Mood and Affect: Mood normal. Mood is not anxious or depressed. Affect is not labile, blunt, angry or inappropriate.        Speech: Speech normal.        Behavior: Behavior normal.        Thought Content: Thought content normal. Thought content is not paranoid or delusional. Thought content does not include homicidal or suicidal ideation. Thought content does not include homicidal or suicidal plan.        Cognition and Memory: Cognition and memory normal.        Judgment: Judgment normal.  Comments: Insight intact    Lab Review:     Component Value Date/Time   NA 140 10/04/2020 1512   NA 142 02/08/2013 1455   K 4.2 10/04/2020 1512   K 4.0 02/08/2013 1455   CL 104 10/04/2020 1512   CL 104 07/22/2012 1426   CO2 27 10/04/2020 1512   CO2 26 02/08/2013 1455   GLUCOSE 99 10/04/2020 1512   GLUCOSE 105 02/08/2013 1455   GLUCOSE 129 (H) 07/22/2012 1426   BUN 14 10/04/2020 1512   BUN 12.2 02/08/2013 1455   CREATININE 0.77 10/04/2020 1512   CREATININE 0.8 02/08/2013 1455   CALCIUM 9.2 10/04/2020 1512   CALCIUM 9.2 02/08/2013 1455   PROT 7.0 10/04/2020 1512   PROT 7.0 10/04/2020 1512   PROT 7.8 02/08/2013 1455   ALBUMIN 4.0 10/04/2020 1512   ALBUMIN 4.0 10/04/2020 1512   ALBUMIN 3.6 02/08/2013 1455   AST 22 10/04/2020 1512   AST 22 10/04/2020 1512   AST 18 02/08/2013 1455   ALT 31 10/04/2020 1512   ALT 31 10/04/2020 1512   ALT 24 02/08/2013 1455   ALKPHOS 87 10/04/2020 1512   ALKPHOS 87 10/04/2020 1512   ALKPHOS 93 02/08/2013 1455   BILITOT 0.5 10/04/2020 1512   BILITOT 0.5 10/04/2020 1512   BILITOT 0.45 02/08/2013 1455   GFRNONAA 89.74 08/06/2009 1159   GFRAA  04/19/2009 0612    >60        The eGFR has been calculated using the MDRD equation. This calculation has not been validated in all clinical situations. eGFR's persistently <60 mL/min signify possible Chronic Kidney Disease.       Component Value  Date/Time   WBC 5.9 10/04/2020 1512   RBC 4.58 10/04/2020 1512   HGB 14.5 10/04/2020 1512   HGB 15.0 02/08/2013 1455   HCT 43.3 10/04/2020 1512   HCT 44.7 02/08/2013 1455   PLT 242.0 10/04/2020 1512   PLT 232 02/08/2013 1455   MCV 94.5 10/04/2020 1512   MCV 92.5 02/08/2013 1455   MCH 31.0 11/16/2014 1624   MCHC 33.6 10/04/2020 1512   RDW 12.9 10/04/2020 1512   RDW 12.6 02/08/2013 1455   LYMPHSABS 2.1 10/04/2020 1512   LYMPHSABS 1.7 02/08/2013 1455   MONOABS 0.6 10/04/2020 1512   MONOABS 0.5 02/08/2013 1455   EOSABS 0.1 10/04/2020 1512   EOSABS 0.1 02/08/2013 1455   BASOSABS 0.1 10/04/2020 1512   BASOSABS 0.1 02/08/2013 1455    No results found for: POCLITH, LITHIUM   No results found for: PHENYTOIN, PHENOBARB, VALPROATE, CBMZ   .res Assessment: Plan:    Plan:  PDMP reviewed  D/C Abilify 27m daily - follow up with neurologist.  Continue Xanax 0.571mBID  RTC 4 weeks  Patient advised to contact office with any questions, adverse effects, or acute worsening in signs and symptoms.  Discussed potential metabolic side effects associated with atypical antipsychotics, as well as potential risk for movement side effects. Advised pt to contact office if movement side effects occur.   Diagnoses and all orders for this visit:  Episodic mood disorder (HCC)  PTSD (post-traumatic stress disorder)  Generalized anxiety disorder  Major depressive disorder, recurrent episode, moderate (HCManti   Please see After Visit Summary for patient specific instructions.  Future Appointments  Date Time Provider DeDennis6/27/2022  3:00 PM [facility]  11/13/2020  2:00 PM [facility]ren Dan[facility]  12/11/2020  3:20 PM Burns, StClaudina LickMD [facility] None  No orders of the defined types were placed in this encounter.   -------------------------------

## 2020-10-29 ENCOUNTER — Other Ambulatory Visit: Payer: Self-pay

## 2020-10-29 ENCOUNTER — Ambulatory Visit (INDEPENDENT_AMBULATORY_CARE_PROVIDER_SITE_OTHER): Payer: Medicare Other | Admitting: General Practice

## 2020-10-29 DIAGNOSIS — Z8673 Personal history of transient ischemic attack (TIA), and cerebral infarction without residual deficits: Secondary | ICD-10-CM

## 2020-10-29 DIAGNOSIS — Z7901 Long term (current) use of anticoagulants: Secondary | ICD-10-CM | POA: Diagnosis not present

## 2020-10-29 LAB — POCT INR: INR: 3 (ref 2.0–3.0)

## 2020-10-29 NOTE — Patient Instructions (Signed)
Pre visit review using our clinic review tool, if applicable. No additional management support is needed unless otherwise documented below in the visit note.  Take 1 (5 mg) tablet daily except take 1 1/2 tablets every other Monday.  Follow calendar.  Re-check in 4 weeks.

## 2020-11-02 ENCOUNTER — Encounter: Payer: Self-pay | Admitting: Internal Medicine

## 2020-11-02 ENCOUNTER — Other Ambulatory Visit: Payer: Self-pay | Admitting: Adult Health

## 2020-11-02 DIAGNOSIS — F39 Unspecified mood [affective] disorder: Secondary | ICD-10-CM

## 2020-11-02 DIAGNOSIS — F4310 Post-traumatic stress disorder, unspecified: Secondary | ICD-10-CM

## 2020-11-02 DIAGNOSIS — F331 Major depressive disorder, recurrent, moderate: Secondary | ICD-10-CM

## 2020-11-02 DIAGNOSIS — F411 Generalized anxiety disorder: Secondary | ICD-10-CM

## 2020-11-08 ENCOUNTER — Encounter: Payer: Self-pay | Admitting: Internal Medicine

## 2020-11-13 ENCOUNTER — Ambulatory Visit: Payer: Medicare Other | Admitting: Physician Assistant

## 2020-11-20 ENCOUNTER — Encounter: Payer: Self-pay | Admitting: Internal Medicine

## 2020-11-26 ENCOUNTER — Ambulatory Visit (INDEPENDENT_AMBULATORY_CARE_PROVIDER_SITE_OTHER): Payer: Medicare Other | Admitting: General Practice

## 2020-11-26 ENCOUNTER — Other Ambulatory Visit: Payer: Self-pay

## 2020-11-26 DIAGNOSIS — Z8673 Personal history of transient ischemic attack (TIA), and cerebral infarction without residual deficits: Secondary | ICD-10-CM

## 2020-11-26 DIAGNOSIS — Z7901 Long term (current) use of anticoagulants: Secondary | ICD-10-CM

## 2020-11-26 LAB — POCT INR: INR: 2.8 (ref 2.0–3.0)

## 2020-11-26 NOTE — Patient Instructions (Addendum)
Pre visit review using our clinic review tool, if applicable. No additional management support is needed unless otherwise documented below in the visit note.  Take 1 (5 mg) tablet daily except take 1 1/2 tablets every other Monday.  Follow calendar.  Re-check in 5 weeks.

## 2020-12-07 ENCOUNTER — Other Ambulatory Visit (INDEPENDENT_AMBULATORY_CARE_PROVIDER_SITE_OTHER): Payer: Medicare Other

## 2020-12-07 ENCOUNTER — Other Ambulatory Visit: Payer: Medicare Other

## 2020-12-07 DIAGNOSIS — R7303 Prediabetes: Secondary | ICD-10-CM | POA: Diagnosis not present

## 2020-12-07 DIAGNOSIS — E782 Mixed hyperlipidemia: Secondary | ICD-10-CM

## 2020-12-07 LAB — LIPID PANEL
Cholesterol: 293 mg/dL — ABNORMAL HIGH (ref 0–200)
HDL: 66.9 mg/dL (ref >39.00)
LDL Cholesterol: 193 mg/dL — ABNORMAL HIGH (ref 0–99)
NonHDL: 226.08
Total CHOL/HDL Ratio: 4
Triglycerides: 167 mg/dL — ABNORMAL HIGH (ref 0.0–149.0)
VLDL: 33.4 mg/dL (ref 0.0–40.0)

## 2020-12-07 LAB — COMPREHENSIVE METABOLIC PANEL
ALT: 27 U/L (ref 0–35)
AST: 20 U/L (ref 0–37)
Albumin: 3.9 g/dL (ref 3.5–5.2)
Alkaline Phosphatase: 82 U/L (ref 39–117)
BUN: 14 mg/dL (ref 6–23)
CO2: 29 mEq/L (ref 19–32)
Calcium: 8.9 mg/dL (ref 8.4–10.5)
Chloride: 104 mEq/L (ref 96–112)
Creatinine, Ser: 0.8 mg/dL (ref 0.40–1.20)
GFR: 72.47 mL/min (ref >60.00)
Glucose, Bld: 98 mg/dL (ref 70–99)
Potassium: 4 mEq/L (ref 3.5–5.1)
Sodium: 139 mEq/L (ref 135–145)
Total Bilirubin: 0.7 mg/dL (ref 0.2–1.2)
Total Protein: 6.9 g/dL (ref 6.0–8.3)

## 2020-12-07 LAB — HEMOGLOBIN A1C: Hgb A1c MFr Bld: 5.7 % (ref 4.6–6.5)

## 2020-12-10 NOTE — Progress Notes (Signed)
Subjective:    Patient ID: Amanda Turner, female    DOB: Jul 22, 1945, 75 y.o.   MRN: [id number]  HPI The patient is here for follow up of their chronic medical problems, including htn, hichol, gerd, anxiety, depression, prediabetes, lupus anticoagulant w h/o CVA on warfarin  She is not exercising regularly.   She does sit too much.  Her feet and ankles are swollen.  She takes lasix 40 mg bid.  She elevates her legs.  She eats out occasion.  Her swelling goes down over night.  She wears the compression socks most days.      Medications and allergies reviewed with patient and updated if appropriate.  Patient Active Problem List   Diagnosis Date Noted   Vitamin D deficiency 06/01/2019   Osteopenia 03/07/2018   Wrist fracture, closed 02/24/2018   GERD (gastroesophageal reflux disease) 02/24/2018   Complex regional pain syndrome I of upper limb 11/27/2017   Degenerative tear of triangular fibrocartilage complex of wrist 11/09/2017   Chronic wrist pain, left 09/10/2017   Leg swelling 08/30/2017   Decreased range of motion of left wrist 07/29/2017   Ulnar impaction syndrome, left 02/23/2017   Long term (current) use of anticoagulants 01/14/2017   Chronic jaw pain 10/27/2016   Prediabetes 08/06/2016   Subluxation of extensor carpi ulnaris tendon, left, initial encounter 03/24/2016   Left lumbar radiculopathy 02/12/2016   Achilles tendinosis 01/17/2016   Cervical radiculopathy at C5 12/13/2015   Low back pain 12/13/2015   Leg cramps 12/13/2015   Anxiety 10/23/2015   Gait instability 10/23/2015   Edema 10/23/2015   Encounter for therapeutic drug monitoring 10/30/2014   Carotid stenosis 09/06/2012   Lupus anticoagulant with hypercoagulable state ([place]) 06/23/2011   H/O: CVA (cerebrovascular accident) 03/06/2011   Hematuria 02/11/2010   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     clotrimazole (LOTRIMIN) 1 % external solution Apply 1 application topically at bedtime. In between toes 60 mL 5   Ergocalciferol (VITAMIN D2) 50 MCG (2000 UT) TABS Take 2,000 Units by mouth daily. 30 tablet    escitalopram (LEXAPRO) 5 MG tablet Take by mouth.     furosemide (LASIX) 40 MG tablet TAKE 1 TABLET BY MOUTH  TWICE DAILY 180 tablet 3   lisinopril (ZESTRIL) 5 MG tablet Take 1 tablet (5 mg total) by mouth daily. 90 tablet 3   potassium chloride SA (KLOR-CON) 20 MEQ tablet Take 2 tablets (40 mEq total) by mouth 2 (two) times daily. 360 tablet 3   traMADol (ULTRAM) 50 MG tablet Take 1 tablet (50 mg total) by mouth daily. 90 tablet 1   warfarin (COUMADIN) 5 MG tablet TAKE 1 TABLET BY MOUTH  DAILY OR AS DIRECTED BY  ANTICOAGULATION CLINIC 100 tablet 3   No current facility-administered medications on file prior to visit.    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)    Depression    Elevated LFT's    HA (headache)    Hyperlipemia    Hypertension    Intracranial  hemorrhage, spontaneous intraparenchymal, associated with coagulopathy, remote, resolved 06/23/2011   Lupus anticoagulant disorder ([place])    Lupus anticoagulant with hypercoagulable state ([place]) 06/23/2011   Lupus erythematosus 06/23/2011   Stroke [facility])     Past Surgical History:  Procedure Laterality Date   ABDOMINAL HYSTERECTOMY     TRANSPHENOIDAL PITUITARY RESECTION      Social History   Socioeconomic History   Marital status: Married    Spouse name: Not on file   Number of children: 2   Years of education: Not on file   Highest education level: Not on file  Occupational History     Employer: UNEMPLOYED  Tobacco Use   Smoking status: Never   Smokeless tobacco: Never  Substance and Sexual Activity   Alcohol use: No    Alcohol/week: 0.0 standard drinks   Drug use: No   Sexual activity: Not on file  Other Topics Concern   Not on file  Social History Narrative   2 cups of coffee daily   Retired   Investment banker, operational of Radio broadcast assistant Strain: Not on file  Food Insecurity: Not on file  Transportation Needs: Not on file  Physical Activity: Not on file  Stress: Not on file  Social Connections: Not on file    Family History  Problem Relation Age of Onset   Diabetes Brother    Diabetes Sister    Hypertension Mother    Dementia Mother    Bladder Cancer Brother    Colon cancer Neg Hx     Review of Systems  Constitutional:  Negative for fever.  Respiratory:  Negative for cough, shortness of breath and wheezing.   Cardiovascular:  Positive for leg swelling (RLE only). Negative for chest pain and palpitations.  Musculoskeletal:  Negative for arthralgias.  Neurological:  Negative for light-headedness and headaches.       Feels a little off balanced with lexapro      Objective:   Vitals:   12/11/20 1507  BP: 130/78  Pulse: 66  Temp: 98.2 F (36.8 C)  SpO2: 99%   BP Readings from Last 3 Encounters:  12/11/20 130/78  06/13/20 120/78  12/07/19 124/76   Wt Readings from Last 3 Encounters:  12/11/20 206 lb 9.6 oz (93.7 kg)  06/13/20 195 lb 6.4 oz (88.6 kg)  12/07/19 195 lb (88.5 kg)   Body mass index is 51.71 kg/m.   Physical Exam    Constitutional: Appears well-developed and well-nourished. No distress.  HENT:  Head: Normocephalic and atraumatic.  Neck: Neck supple. No tracheal deviation present. No thyromegaly present.  No cervical lymphadenopathy Cardiovascular: Normal rate, regular rhythm and normal heart sounds.   No murmur heard. No carotid bruit .  1+ right lower extremity pitting edema, mild left lower extremity  edema Pulmonary/Chest: Effort normal and breath sounds normal. No respiratory distress. No has no wheezes. No rales.  Skin: Skin is warm and dry. Not diaphoretic.  Psychiatric: Normal mood and affect. Behavior is normal.   Lab Results  Component Value Date   WBC 5.9 10/04/2020   HGB 14.5 10/04/2020   HCT 43.3 10/04/2020   PLT 242.0 10/04/2020   GLUCOSE 98 12/07/2020   CHOL 293 (H) 12/07/2020   TRIG 167.0 (H) 12/07/2020   HDL 66.90 12/07/2020   LDLDIRECT 112.6 04/23/2011   LDLCALC 193 (H) 12/07/2020   ALT 27 12/07/2020   AST 20 12/07/2020   NA 139 12/07/2020   K 4.0 12/07/2020   CL 104 12/07/2020  CREATININE 0.80 12/07/2020   BUN 14 12/07/2020   CO2 29 12/07/2020   TSH 0.78 06/12/2020   INR 2.8 11/26/2020   HGBA1C 5.7 12/07/2020      Assessment & Plan:    See Problem List for Assessment and Plan of chronic medical problems.    This visit occurred during the SARS-CoV-2 public health emergency.  Safety protocols were in place, including screening questions prior to the visit, additional usage of staff PPE, and extensive cleaning of exam room while observing appropriate contact time as indicated for disinfecting solutions.

## 2020-12-11 ENCOUNTER — Encounter: Payer: Self-pay | Admitting: Internal Medicine

## 2020-12-11 ENCOUNTER — Ambulatory Visit (INDEPENDENT_AMBULATORY_CARE_PROVIDER_SITE_OTHER): Payer: Medicare Other | Admitting: Internal Medicine

## 2020-12-11 ENCOUNTER — Other Ambulatory Visit: Payer: Self-pay

## 2020-12-11 VITALS — BP 130/78 | HR 66 | Temp 98.2°F | Ht <= 58 in | Wt 206.6 lb

## 2020-12-11 DIAGNOSIS — Z8673 Personal history of transient ischemic attack (TIA), and cerebral infarction without residual deficits: Secondary | ICD-10-CM | POA: Diagnosis not present

## 2020-12-11 DIAGNOSIS — E782 Mixed hyperlipidemia: Secondary | ICD-10-CM | POA: Diagnosis not present

## 2020-12-11 DIAGNOSIS — R7303 Prediabetes: Secondary | ICD-10-CM

## 2020-12-11 DIAGNOSIS — E559 Vitamin D deficiency, unspecified: Secondary | ICD-10-CM

## 2020-12-11 DIAGNOSIS — D6862 Lupus anticoagulant syndrome: Secondary | ICD-10-CM

## 2020-12-11 DIAGNOSIS — K219 Gastro-esophageal reflux disease without esophagitis: Secondary | ICD-10-CM | POA: Diagnosis not present

## 2020-12-11 DIAGNOSIS — I10 Essential (primary) hypertension: Secondary | ICD-10-CM

## 2020-12-11 DIAGNOSIS — R600 Localized edema: Secondary | ICD-10-CM

## 2020-12-11 DIAGNOSIS — F419 Anxiety disorder, unspecified: Secondary | ICD-10-CM

## 2020-12-11 DIAGNOSIS — F3289 Other specified depressive episodes: Secondary | ICD-10-CM

## 2020-12-11 MED ORDER — SIMVASTATIN 10 MG PO TABS: 10.0000 mg | ORAL_TABLET | Freq: Every day | ORAL | 3 refills | Status: DC

## 2020-12-11 NOTE — Assessment & Plan Note (Signed)
Chronic Taking vitamin D daily We will check vitamin D level at her next visit

## 2020-12-11 NOTE — Assessment & Plan Note (Signed)
Chronic Blood pressure well controlled Continue lisinopril 5 mg daily

## 2020-12-11 NOTE — Assessment & Plan Note (Signed)
Chronic A1c 5.7%-well-controlled Encourage more regular exercise and weight loss

## 2020-12-11 NOTE — Assessment & Plan Note (Signed)
Chronic History of CVA Continue aspirin 81 mg daily, warfarin Will trial of a statin to see if she tolerates it Blood pressure well controlled, sugars controlled

## 2020-12-11 NOTE — Assessment & Plan Note (Signed)
Chronic Has not tolerated several statins or Zetia Discussed option of retrying a statin or referral to lipid clinic to discuss injectable cholesterol medication She would like to retry simvastatin-her side effect in the past with that was cough, which is somewhat atypical Trial simvastatin 10 mg daily Encouraged regular exercise and weight loss

## 2020-12-11 NOTE — Assessment & Plan Note (Signed)
Acute Has some chronic bilateral lower extremity edema-recently right lower leg has been more swollen than left lower leg for no obvious reason She denies right knee arthritis Right lower leg is more swollen than left lower leg on exam She is on warfarin so blood clot is less likely, but she does have lupus anticoagulant so the increased risk is very Ultrasound Doppler lower extremities to rule out blood clot Continue compression socks daily, elevating legs when sitting Encourage more regular activity-she does sit too much Stressed weight loss Stressed low-sodium diet

## 2020-12-11 NOTE — Patient Instructions (Addendum)
  Blood work was ordered for your next visit.     Medications changes include :   simvastatin 10 mg daily  Your prescription(s) have been submitted to your pharmacy. Please take as directed and contact our office if you believe you are having problem(s) with the medication(s).   A leg ultrasound was ordered to rule out a blood clot.     Someone from their office will call you to schedule an appointment.    Please followup in 6 months

## 2020-12-11 NOTE — Assessment & Plan Note (Signed)
GERD Controlled

## 2020-12-11 NOTE — Assessment & Plan Note (Signed)
Chronic Following with psychiatry Started on Lexapro 5 mg daily 2 weeks ago

## 2020-12-11 NOTE — Assessment & Plan Note (Signed)
Chronic Fairly controlled Just started on Lexapro 5 mg daily by psychiatry, which may help Continue alprazolam 0.5 mg half tablet in the morning and 1 tablet in the evening

## 2020-12-11 NOTE — Assessment & Plan Note (Signed)
Chronic Taking anticoagulation long-term-warfarin, which is being monitored by our Coumadin clinic

## 2020-12-14 ENCOUNTER — Ambulatory Visit (HOSPITAL_COMMUNITY)
Admission: RE | Admit: 2020-12-14 | Discharge: 2020-12-14 | Disposition: A | Discharge status: Home / Self Care | Payer: Medicare Other | Source: Ambulatory Visit | Attending: Cardiology | Admitting: Cardiology

## 2020-12-14 ENCOUNTER — Other Ambulatory Visit: Payer: Self-pay

## 2020-12-14 DIAGNOSIS — R600 Localized edema: Secondary | ICD-10-CM

## 2020-12-18 ENCOUNTER — Encounter: Payer: Self-pay | Admitting: Internal Medicine

## 2020-12-21 ENCOUNTER — Other Ambulatory Visit: Payer: Self-pay | Admitting: Internal Medicine

## 2020-12-27 ENCOUNTER — Telehealth: Payer: Self-pay | Admitting: Internal Medicine

## 2020-12-27 NOTE — Telephone Encounter (Signed)
Left message for patient to call me back at [phone] to schedule Medicare Annual Wellness Visit   No hx of AWV eligible as of 05/05/09  Please schedule at anytime with [facility] Advisor if patient calls the office back.    40 Minutes appointment   Any questions, please call me at [phone]  

## 2020-12-31 ENCOUNTER — Other Ambulatory Visit: Payer: Self-pay

## 2020-12-31 ENCOUNTER — Ambulatory Visit (INDEPENDENT_AMBULATORY_CARE_PROVIDER_SITE_OTHER): Payer: Medicare Other

## 2020-12-31 DIAGNOSIS — Z7901 Long term (current) use of anticoagulants: Secondary | ICD-10-CM | POA: Diagnosis not present

## 2020-12-31 LAB — POCT INR: INR: 2.5 (ref 2.0–3.0)

## 2020-12-31 NOTE — Patient Instructions (Addendum)
Pre visit review using our clinic review tool, if applicable. No additional management support is needed unless otherwise documented below in the visit note.  Take 1 (5 mg) tablet daily except take 1 1/2 tablets every other Monday.  Follow calendar.  Re-check in 5 weeks.

## 2021-01-01 ENCOUNTER — Encounter: Payer: Self-pay | Admitting: Internal Medicine

## 2021-01-12 ENCOUNTER — Encounter: Payer: Self-pay | Admitting: Internal Medicine

## 2021-01-30 ENCOUNTER — Encounter: Payer: Self-pay | Admitting: Internal Medicine

## 2021-01-31 DIAGNOSIS — I700 Atherosclerosis of aorta: Secondary | ICD-10-CM | POA: Diagnosis not present

## 2021-01-31 DIAGNOSIS — R911 Solitary pulmonary nodule: Secondary | ICD-10-CM | POA: Diagnosis not present

## 2021-02-01 MED ORDER — ESCITALOPRAM OXALATE 5 MG PO TABS: 5.0000 mg | ORAL_TABLET | Freq: Every day | ORAL | 2 refills | Status: DC

## 2021-02-01 NOTE — Addendum Note (Signed)
Addended by: [name] on: 02/01/2021 12:03 PM   Modules accepted: Orders

## 2021-02-04 ENCOUNTER — Ambulatory Visit: Payer: Medicare Other

## 2021-02-04 DIAGNOSIS — N200 Calculus of kidney: Secondary | ICD-10-CM | POA: Diagnosis not present

## 2021-02-04 DIAGNOSIS — R310 Gross hematuria: Secondary | ICD-10-CM | POA: Diagnosis not present

## 2021-02-15 ENCOUNTER — Encounter: Payer: Self-pay | Admitting: Internal Medicine

## 2021-02-20 ENCOUNTER — Other Ambulatory Visit: Payer: Self-pay

## 2021-02-20 ENCOUNTER — Ambulatory Visit (INDEPENDENT_AMBULATORY_CARE_PROVIDER_SITE_OTHER): Payer: Medicare Other

## 2021-02-20 DIAGNOSIS — Z7901 Long term (current) use of anticoagulants: Secondary | ICD-10-CM

## 2021-02-20 DIAGNOSIS — Z8673 Personal history of transient ischemic attack (TIA), and cerebral infarction without residual deficits: Secondary | ICD-10-CM

## 2021-02-20 LAB — POCT INR: INR: 2.7 (ref 2.0–3.0)

## 2021-02-20 NOTE — Progress Notes (Signed)
Agree with management.  Sara Keys J Abrial Arrighi, MD  

## 2021-02-20 NOTE — Patient Instructions (Addendum)
Pre visit review using our clinic review tool, if applicable. No additional management support is needed unless otherwise documented below in the visit note.  Take 1 (5 mg) tablet daily except take 1 1/2 tablets every other Monday.  Follow calendar.  Re-check in 5 weeks.

## 2021-02-22 ENCOUNTER — Encounter: Payer: Self-pay | Admitting: Internal Medicine

## 2021-02-25 ENCOUNTER — Other Ambulatory Visit: Payer: Self-pay | Admitting: Internal Medicine

## 2021-02-25 ENCOUNTER — Other Ambulatory Visit: Payer: Self-pay

## 2021-02-26 MED ORDER — DULOXETINE HCL 30 MG PO CPEP: 30.0000 mg | ORAL_CAPSULE | Freq: Every day | ORAL | 1 refills | Status: DC

## 2021-02-26 MED ORDER — SIMVASTATIN 20 MG PO TABS: 20.0000 mg | ORAL_TABLET | Freq: Every day | ORAL | 3 refills | Status: DC

## 2021-02-26 NOTE — Addendum Note (Signed)
Addended by: [name] on: 02/26/2021 07:58 AM   Modules accepted: Orders

## 2021-03-27 ENCOUNTER — Ambulatory Visit (INDEPENDENT_AMBULATORY_CARE_PROVIDER_SITE_OTHER): Payer: Medicare Other

## 2021-03-27 DIAGNOSIS — Z7901 Long term (current) use of anticoagulants: Secondary | ICD-10-CM | POA: Diagnosis not present

## 2021-03-27 LAB — POCT INR: INR: 3.1 — AB (ref 2.0–3.0)

## 2021-03-27 NOTE — Patient Instructions (Addendum)
Pre visit review using our clinic review tool, if applicable. No additional management support is needed unless otherwise documented below in the visit note.  Only take 1/2 (2.5mg ) tablet today and then continue 1 talblet (5 mg) daily except take 1 1/2 tablets every other Monday.  Re-check in 6 weeks.

## 2021-03-27 NOTE — Progress Notes (Signed)
Only take 1/2 (2.5mg ) tablet today and then continue 1 talblet (5 mg) daily except take 1 1/2 tablets every other Monday.  Re-check in 6 weeks.

## 2021-04-17 ENCOUNTER — Other Ambulatory Visit: Payer: Self-pay | Admitting: Internal Medicine

## 2021-04-17 DIAGNOSIS — Z7901 Long term (current) use of anticoagulants: Secondary | ICD-10-CM

## 2021-04-28 ENCOUNTER — Other Ambulatory Visit: Payer: Self-pay | Admitting: Internal Medicine

## 2021-05-08 ENCOUNTER — Ambulatory Visit (INDEPENDENT_AMBULATORY_CARE_PROVIDER_SITE_OTHER): Payer: Medicare Other

## 2021-05-08 DIAGNOSIS — Z7901 Long term (current) use of anticoagulants: Secondary | ICD-10-CM | POA: Diagnosis not present

## 2021-05-08 LAB — POCT INR: INR: 2.7 (ref 2.0–3.0)

## 2021-05-08 NOTE — Progress Notes (Signed)
Continue 1 talblet (5 mg) daily.  Re-check in 6 weeks.

## 2021-05-08 NOTE — Patient Instructions (Addendum)
Pre visit review using our clinic review tool, if applicable. No additional management support is needed unless otherwise documented below in the visit note.  Continue 1 talblet (5 mg) daily.  Re-check in 6 weeks.

## 2021-05-29 ENCOUNTER — Encounter: Payer: Self-pay | Admitting: Internal Medicine

## 2021-05-29 ENCOUNTER — Other Ambulatory Visit: Payer: Self-pay

## 2021-05-29 MED ORDER — SIMVASTATIN 20 MG PO TABS: 20.0000 mg | ORAL_TABLET | Freq: Every day | ORAL | 3 refills | Status: DC

## 2021-06-07 ENCOUNTER — Other Ambulatory Visit: Payer: Medicare Other

## 2021-06-11 ENCOUNTER — Other Ambulatory Visit: Payer: Self-pay

## 2021-06-11 ENCOUNTER — Other Ambulatory Visit (INDEPENDENT_AMBULATORY_CARE_PROVIDER_SITE_OTHER): Payer: Medicare Other

## 2021-06-11 DIAGNOSIS — E782 Mixed hyperlipidemia: Secondary | ICD-10-CM | POA: Diagnosis not present

## 2021-06-11 DIAGNOSIS — D6862 Lupus anticoagulant syndrome: Secondary | ICD-10-CM | POA: Diagnosis not present

## 2021-06-11 DIAGNOSIS — I10 Essential (primary) hypertension: Secondary | ICD-10-CM | POA: Diagnosis not present

## 2021-06-11 DIAGNOSIS — R7303 Prediabetes: Secondary | ICD-10-CM | POA: Diagnosis not present

## 2021-06-11 DIAGNOSIS — E559 Vitamin D deficiency, unspecified: Secondary | ICD-10-CM

## 2021-06-11 LAB — COMPREHENSIVE METABOLIC PANEL
ALT: 28 U/L (ref 0–35)
AST: 23 U/L (ref 0–37)
Albumin: 4 g/dL (ref 3.5–5.2)
Alkaline Phosphatase: 90 U/L (ref 39–117)
BUN: 15 mg/dL (ref 6–23)
CO2: 31 mEq/L (ref 19–32)
Calcium: 8.9 mg/dL (ref 8.4–10.5)
Chloride: 104 mEq/L (ref 96–112)
Creatinine, Ser: 0.83 mg/dL (ref 0.40–1.20)
GFR: 69.09 mL/min (ref >60.00)
Glucose, Bld: 110 mg/dL — ABNORMAL HIGH (ref 70–99)
Potassium: 3.6 mEq/L (ref 3.5–5.1)
Sodium: 139 mEq/L (ref 135–145)
Total Bilirubin: 0.6 mg/dL (ref 0.2–1.2)
Total Protein: 6.9 g/dL (ref 6.0–8.3)

## 2021-06-11 LAB — CBC WITH DIFFERENTIAL/PLATELET
Basophils Absolute: 0.1 10*3/uL (ref 0.0–0.1)
Basophils Relative: 1.1 % (ref 0.0–3.0)
Eosinophils Absolute: 0.1 10*3/uL (ref 0.0–0.7)
Eosinophils Relative: 2.9 % (ref 0.0–5.0)
HCT: 43.8 % (ref 36.0–46.0)
Hemoglobin: 14.7 g/dL (ref 12.0–15.0)
Lymphocytes Relative: 34.8 % (ref 12.0–46.0)
Lymphs Abs: 1.7 10*3/uL (ref 0.7–4.0)
MCHC: 33.6 g/dL (ref 30.0–36.0)
MCV: 94 fl (ref 78.0–100.0)
Monocytes Absolute: 0.6 10*3/uL (ref 0.1–1.0)
Monocytes Relative: 11.5 % (ref 3.0–12.0)
Neutro Abs: 2.4 10*3/uL (ref 1.4–7.7)
Neutrophils Relative %: 49.7 % (ref 43.0–77.0)
Platelets: 226 10*3/uL (ref 150.0–400.0)
RBC: 4.66 Mil/uL (ref 3.87–5.11)
RDW: 12.8 % (ref 11.5–15.5)
WBC: 4.9 10*3/uL (ref 4.0–10.5)

## 2021-06-11 LAB — LIPID PANEL
Cholesterol: 204 mg/dL — ABNORMAL HIGH (ref 0–200)
HDL: 69.4 mg/dL (ref >39.00)
LDL Cholesterol: 109 mg/dL — ABNORMAL HIGH (ref 0–99)
NonHDL: 134.38
Total CHOL/HDL Ratio: 3
Triglycerides: 129 mg/dL (ref 0.0–149.0)
VLDL: 25.8 mg/dL (ref 0.0–40.0)

## 2021-06-11 LAB — HEMOGLOBIN A1C: Hgb A1c MFr Bld: 5.9 % (ref 4.6–6.5)

## 2021-06-11 LAB — TSH: TSH: 0.6 u[IU]/mL (ref 0.35–5.50)

## 2021-06-11 LAB — VITAMIN D 25 HYDROXY (VIT D DEFICIENCY, FRACTURES): VITD: 25.55 ng/mL — ABNORMAL LOW (ref 30.00–100.00)

## 2021-06-13 NOTE — Patient Instructions (Addendum)
Call Gi to schedule your colonoscopy - Phone: [phone]      Medications changes include :   increase simvastatin 40 mg daily  Your prescription(s) have been submitted to your pharmacy. Please take as directed and contact our office if you believe you are having problem(s) with the medication(s).   Please followup in 6 months

## 2021-06-13 NOTE — Progress Notes (Signed)
Subjective:    Patient ID: Amanda Turner, female    DOB: 1946/03/11, 76 y.o.   MRN: [id number]  This visit occurred during the SARS-CoV-2 public health emergency.  Safety protocols were in place, including screening questions prior to the visit, additional usage of staff PPE, and extensive cleaning of exam room while observing appropriate contact time as indicated for disinfecting solutions.     HPI The patient is here for follow up of their chronic medical problems, including htn, hld, h/o cva on warfarin, lupus anticoagulant, prediabetes, gerd, anxiety, depression  LDL above goal.  Vitamin d level a little low  Chronic b/l LE edema - RLE > LLE - has tried compression socks.  Elevates legs during the day.   Medications and allergies reviewed with patient and updated if appropriate.  Patient Active Problem List   Diagnosis Date Noted   Edema of right lower leg 12/11/2020   Vitamin D deficiency 06/01/2019   Osteopenia 03/07/2018   Wrist fracture, closed 02/24/2018   GERD (gastroesophageal reflux disease) 02/24/2018   Complex regional pain syndrome I of upper limb 11/27/2017   Degenerative tear of triangular fibrocartilage complex of wrist 11/09/2017   Chronic wrist pain, left 09/10/2017   Leg swelling 08/30/2017   Decreased range of motion of left wrist 07/29/2017   Ulnar impaction syndrome, left 02/23/2017   Long term (current) use of anticoagulants 01/14/2017   Chronic jaw pain 10/27/2016   Prediabetes 08/06/2016   Subluxation of extensor carpi ulnaris tendon, left, initial encounter 03/24/2016   Left lumbar radiculopathy 02/12/2016   Achilles tendinosis 01/17/2016   Cervical radiculopathy at C5 12/13/2015   Low back pain 12/13/2015   Leg cramps 12/13/2015   Anxiety 10/23/2015   Gait instability 10/23/2015   Edema 10/23/2015   Encounter for therapeutic drug monitoring 10/30/2014   Carotid stenosis 09/06/2012   Lupus anticoagulant with hypercoagulable state ([facility])  06/23/2011   H/O: CVA (cerebrovascular accident) 03/06/2011   Hematuria 02/11/2010   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     clotrimazole (LOTRIMIN) 1 % external solution Apply 1 application topically at bedtime. In between toes 60 mL 5   DULoxetine (CYMBALTA) 30 MG capsule Take 1 capsule (30 mg total) by mouth daily. 90 capsule 1   Ergocalciferol (VITAMIN D2) 50 MCG (2000 UT) TABS Take 2,000 Units by mouth daily. 30 tablet    furosemide (LASIX) 40 MG tablet TAKE 1 TABLET BY MOUTH  TWICE DAILY 180 tablet 3   lisinopril (ZESTRIL) 5 MG tablet TAKE 1 TABLET BY MOUTH  DAILY 90 tablet 3   potassium chloride SA (KLOR-CON) 20 MEQ tablet Take 2 tablets (40 mEq total) by mouth 2 (two) times daily. 360 tablet 3   simvastatin (ZOCOR) 20 MG tablet Take 1 tablet (20 mg total) by mouth at bedtime. 90 tablet 3   traMADol (ULTRAM) 50 MG tablet Take 1 tablet (50 mg total) by mouth daily. 90 tablet 1   warfarin (COUMADIN) 5 MG tablet TAKE 1 TABLET BY MOUTH  DAILY OR AS DIRECTED BY  ANTICOAGULATION CLINIC 100 tablet 3   No current facility-administered  medications on file prior to visit.    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)    Depression    Elevated LFT's    HA (headache)    Hyperlipemia    Hypertension    Intracranial hemorrhage, spontaneous intraparenchymal, associated with coagulopathy, remote, resolved 06/23/2011   Lupus anticoagulant disorder (HCC)    Lupus anticoagulant with hypercoagulable state ([facility]) 06/23/2011   Lupus erythematosus 06/23/2011   Stroke  [facility])     Past Surgical History:  Procedure Laterality Date   ABDOMINAL HYSTERECTOMY     TRANSPHENOIDAL PITUITARY RESECTION      Social History   Socioeconomic History   Marital status: Married    Spouse name: Not on file   Number of children: 2   Years of education: Not on file   Highest education level: Not on file  Occupational History    Employer: UNEMPLOYED  Tobacco Use   Smoking status: Never   Smokeless tobacco: Never  Substance and Sexual Activity   Alcohol use: No    Alcohol/week: 0.0 standard drinks   Drug use: No   Sexual activity: Not on file  Other Topics Concern   Not on file  Social History Narrative   2 cups of coffee daily   Retired   Investment banker, operational of Radio broadcast assistant Strain: Not on file  Food Insecurity: Not on file  Transportation Needs: Not on file  Physical Activity: Not on file  Stress: Not on file  Social Connections: Not on file    Family History  Problem Relation Age of Onset   Diabetes Brother    Diabetes Sister    Hypertension Mother    Dementia Mother    Bladder Cancer Brother    Colon cancer Neg Hx     Review of Systems  Constitutional:  Negative for chills and fever.  Respiratory:  Negative for cough, shortness of breath and wheezing.   Cardiovascular:  Positive for leg swelling. Negative for chest pain and palpitations.  Neurological:  Positive for headaches. Negative for light-headedness.      Objective:  There were no vitals filed for this visit. BP Readings from Last 3 Encounters:  12/11/20 130/78  06/13/20 120/78  12/07/19 124/76   Wt Readings from Last 3 Encounters:  12/11/20 206 lb 9.6 oz (93.7 kg)  06/13/20 195 lb 6.4 oz (88.6 kg)  12/07/19 195 lb (88.5 kg)   There is no height or weight on file to calculate BMI.   Physical Exam    Constitutional: Appears well-developed and well-nourished. No distress.  HENT:  Head: Normocephalic and atraumatic.  Neck: Neck supple. No tracheal  deviation present. No thyromegaly present.  No cervical lymphadenopathy Cardiovascular: Normal rate, regular rhythm and normal heart sounds.   No murmur heard. No carotid bruit .  No edema Pulmonary/Chest: Effort normal and breath sounds normal. No respiratory distress. No has no wheezes. No rales.  Skin: Skin is warm and dry. Not diaphoretic.  Psychiatric: Normal mood and affect. Behavior is normal.   Lab Results  Component Value Date   WBC 4.9 06/11/2021   HGB 14.7 06/11/2021   HCT 43.8 06/11/2021   PLT 226.0 06/11/2021   GLUCOSE 110 (H) 06/11/2021   CHOL 204 (H) 06/11/2021   TRIG 129.0 06/11/2021   HDL 69.40 06/11/2021   LDLDIRECT 112.6 04/23/2011   LDLCALC 109 (H) 06/11/2021   ALT 28 06/11/2021  AST 23 06/11/2021   NA 139 06/11/2021   K 3.6 06/11/2021   CL 104 06/11/2021   CREATININE 0.83 06/11/2021   BUN 15 06/11/2021   CO2 31 06/11/2021   TSH 0.60 06/11/2021   INR 2.7 05/08/2021   HGBA1C 5.9 06/11/2021      Assessment & Plan:    See Problem List for Assessment and Plan of chronic medical problems.

## 2021-06-14 ENCOUNTER — Other Ambulatory Visit: Payer: Self-pay

## 2021-06-14 ENCOUNTER — Encounter: Payer: Self-pay | Admitting: Internal Medicine

## 2021-06-14 ENCOUNTER — Ambulatory Visit (INDEPENDENT_AMBULATORY_CARE_PROVIDER_SITE_OTHER): Payer: Medicare Other | Admitting: Internal Medicine

## 2021-06-14 VITALS — BP 132/70 | HR 70 | Temp 98.5°F | Ht <= 58 in | Wt 210.0 lb

## 2021-06-14 DIAGNOSIS — R600 Localized edema: Secondary | ICD-10-CM

## 2021-06-14 DIAGNOSIS — E559 Vitamin D deficiency, unspecified: Secondary | ICD-10-CM | POA: Diagnosis not present

## 2021-06-14 DIAGNOSIS — Z0000 Encounter for general adult medical examination without abnormal findings: Secondary | ICD-10-CM

## 2021-06-14 DIAGNOSIS — E782 Mixed hyperlipidemia: Secondary | ICD-10-CM | POA: Diagnosis not present

## 2021-06-14 DIAGNOSIS — R7303 Prediabetes: Secondary | ICD-10-CM | POA: Diagnosis not present

## 2021-06-14 DIAGNOSIS — I10 Essential (primary) hypertension: Secondary | ICD-10-CM

## 2021-06-14 MED ORDER — FUROSEMIDE 40 MG PO TABS: ORAL_TABLET | ORAL | 3 refills | Status: DC

## 2021-06-14 MED ORDER — DULOXETINE HCL 30 MG PO CPEP: 30.0000 mg | ORAL_CAPSULE | Freq: Every day | ORAL | 1 refills | Status: AC

## 2021-06-14 MED ORDER — POTASSIUM CHLORIDE CRYS ER 20 MEQ PO TBCR: 40.0000 meq | EXTENDED_RELEASE_TABLET | Freq: Two times a day (BID) | ORAL | 3 refills | Status: DC

## 2021-06-14 MED ORDER — SIMVASTATIN 40 MG PO TABS: 40.0000 mg | ORAL_TABLET | Freq: Every day | ORAL | 3 refills | Status: DC

## 2021-06-14 MED ORDER — ALPRAZOLAM 0.5 MG PO TABS: ORAL_TABLET | ORAL | 0 refills | Status: DC

## 2021-06-14 NOTE — Assessment & Plan Note (Signed)
Chronic Lipids not at goal Increase simvastatin to 40 mg daily-hopefully she will tolerate that

## 2021-06-14 NOTE — Assessment & Plan Note (Signed)
Chronic Sugars in prediabetic range Stressed regular exercise, weight loss and diabetic diet

## 2021-06-14 NOTE — Assessment & Plan Note (Signed)
Chronic Vitamin D level still lightly low Advised increasing vitamin D level 2000 units daily

## 2021-06-14 NOTE — Assessment & Plan Note (Signed)
Chronic Blood pressure well controlled CMP Continue lisinopril 5 mg daily 

## 2021-06-14 NOTE — Assessment & Plan Note (Signed)
Chronic Overall stable Continue furosemide 40 mg twice daily Again discussed wearing compression socks daily, elevating the legs when sitting, stressed weight loss, regular walking and low-sodium diet Would not recommend increasing furosemide Deferred vascular referral

## 2021-06-19 ENCOUNTER — Ambulatory Visit (INDEPENDENT_AMBULATORY_CARE_PROVIDER_SITE_OTHER): Payer: Medicare Other

## 2021-06-19 DIAGNOSIS — Z7901 Long term (current) use of anticoagulants: Secondary | ICD-10-CM | POA: Diagnosis not present

## 2021-06-19 LAB — POCT INR: INR: 2.3 (ref 2.0–3.0)

## 2021-06-19 NOTE — Progress Notes (Signed)
Continue 1 talblet (5 mg) daily.  Re-check in 6 weeks.

## 2021-06-19 NOTE — Patient Instructions (Addendum)
Pre visit review using our clinic review tool, if applicable. No additional management support is needed unless otherwise documented below in the visit note.  Continue 1 talblet (5 mg) daily.  Re-check in 6 weeks.

## 2021-07-01 ENCOUNTER — Encounter: Payer: Self-pay | Admitting: Gastroenterology

## 2021-07-10 ENCOUNTER — Telehealth: Payer: Self-pay

## 2021-07-10 ENCOUNTER — Encounter: Payer: Self-pay | Admitting: Internal Medicine

## 2021-07-10 MED ORDER — SIMVASTATIN 40 MG PO TABS: 40.0000 mg | ORAL_TABLET | Freq: Every day | ORAL | 3 refills | Status: AC

## 2021-07-14 ENCOUNTER — Encounter: Payer: Self-pay | Admitting: Internal Medicine

## 2021-07-31 ENCOUNTER — Ambulatory Visit (INDEPENDENT_AMBULATORY_CARE_PROVIDER_SITE_OTHER): Payer: Medicare Other

## 2021-07-31 DIAGNOSIS — Z7901 Long term (current) use of anticoagulants: Secondary | ICD-10-CM | POA: Diagnosis not present

## 2021-07-31 LAB — POCT INR: INR: 2.2 (ref 2.0–3.0)

## 2021-07-31 NOTE — Patient Instructions (Addendum)
Pre visit review using our clinic review tool, if applicable. No additional management support is needed unless otherwise documented below in the visit note. ? ?Continue 1 talblet (5 mg) daily.  Re-check in 6 weeks. ?

## 2021-07-31 NOTE — Progress Notes (Signed)
Continue 1 talblet (5 mg) daily.  Re-check in 6 weeks. ?

## 2021-08-06 DIAGNOSIS — L84 Corns and callosities: Secondary | ICD-10-CM | POA: Diagnosis not present

## 2021-08-06 DIAGNOSIS — I10 Essential (primary) hypertension: Secondary | ICD-10-CM | POA: Diagnosis not present

## 2021-08-27 DIAGNOSIS — S8002XA Contusion of left knee, initial encounter: Secondary | ICD-10-CM | POA: Diagnosis not present

## 2021-08-27 DIAGNOSIS — M79605 Pain in left leg: Secondary | ICD-10-CM | POA: Diagnosis not present

## 2021-08-27 DIAGNOSIS — S8982XA Other specified injuries of left lower leg, initial encounter: Secondary | ICD-10-CM | POA: Diagnosis not present

## 2021-08-27 DIAGNOSIS — Z7901 Long term (current) use of anticoagulants: Secondary | ICD-10-CM | POA: Diagnosis not present

## 2021-08-27 DIAGNOSIS — M25562 Pain in left knee: Secondary | ICD-10-CM | POA: Diagnosis not present

## 2021-08-27 DIAGNOSIS — R600 Localized edema: Secondary | ICD-10-CM | POA: Diagnosis not present

## 2021-08-27 DIAGNOSIS — T1490XA Injury, unspecified, initial encounter: Secondary | ICD-10-CM | POA: Diagnosis not present

## 2021-08-27 DIAGNOSIS — R609 Edema, unspecified: Secondary | ICD-10-CM | POA: Diagnosis not present

## 2021-08-29 ENCOUNTER — Encounter: Payer: Self-pay | Admitting: Internal Medicine

## 2021-08-30 DIAGNOSIS — I119 Hypertensive heart disease without heart failure: Secondary | ICD-10-CM | POA: Diagnosis not present

## 2021-08-30 DIAGNOSIS — H60392 Other infective otitis externa, left ear: Secondary | ICD-10-CM | POA: Diagnosis not present

## 2021-08-30 DIAGNOSIS — G4700 Insomnia, unspecified: Secondary | ICD-10-CM | POA: Diagnosis not present

## 2021-08-30 DIAGNOSIS — E876 Hypokalemia: Secondary | ICD-10-CM | POA: Diagnosis not present

## 2021-08-30 DIAGNOSIS — Z7901 Long term (current) use of anticoagulants: Secondary | ICD-10-CM | POA: Diagnosis not present

## 2021-08-30 DIAGNOSIS — R945 Abnormal results of liver function studies: Secondary | ICD-10-CM | POA: Diagnosis not present

## 2021-08-30 DIAGNOSIS — I6789 Other cerebrovascular disease: Secondary | ICD-10-CM | POA: Diagnosis not present

## 2021-08-30 DIAGNOSIS — R609 Edema, unspecified: Secondary | ICD-10-CM | POA: Diagnosis not present

## 2021-08-30 DIAGNOSIS — E785 Hyperlipidemia, unspecified: Secondary | ICD-10-CM | POA: Diagnosis not present

## 2021-08-30 DIAGNOSIS — M25569 Pain in unspecified knee: Secondary | ICD-10-CM | POA: Diagnosis not present

## 2021-09-03 DIAGNOSIS — U071 COVID-19: Secondary | ICD-10-CM | POA: Diagnosis not present

## 2021-09-03 DIAGNOSIS — H6122 Impacted cerumen, left ear: Secondary | ICD-10-CM | POA: Diagnosis not present

## 2021-09-11 ENCOUNTER — Ambulatory Visit: Payer: Medicare Other

## 2021-09-17 DIAGNOSIS — E7800 Pure hypercholesterolemia, unspecified: Secondary | ICD-10-CM | POA: Diagnosis not present

## 2021-09-17 DIAGNOSIS — H6502 Acute serous otitis media, left ear: Secondary | ICD-10-CM | POA: Diagnosis not present

## 2021-09-17 DIAGNOSIS — I10 Essential (primary) hypertension: Secondary | ICD-10-CM | POA: Diagnosis not present

## 2021-09-17 DIAGNOSIS — H6982 Other specified disorders of Eustachian tube, left ear: Secondary | ICD-10-CM | POA: Diagnosis not present

## 2021-09-18 ENCOUNTER — Telehealth: Payer: Self-pay

## 2021-09-18 NOTE — Telephone Encounter (Signed)
Pt missed apt for INR check last week. When looking through chart it appears this message may have never been sent to you. Left a voicemail for pt to return call to reschedule apt for INR check. Cell phone in chart reports it is a wrong number. LVM on home numbre. ?

## 2021-09-18 NOTE — Telephone Encounter (Signed)
Pt NS apt last week for coumadin clinic. ?LVM to RS ?

## 2021-09-22 ENCOUNTER — Other Ambulatory Visit: Payer: Self-pay | Admitting: Internal Medicine

## 2021-09-25 DIAGNOSIS — H6502 Acute serous otitis media, left ear: Secondary | ICD-10-CM | POA: Diagnosis not present

## 2021-09-25 DIAGNOSIS — H6982 Other specified disorders of Eustachian tube, left ear: Secondary | ICD-10-CM | POA: Diagnosis not present

## 2021-09-25 DIAGNOSIS — R0981 Nasal congestion: Secondary | ICD-10-CM | POA: Diagnosis not present

## 2021-09-25 DIAGNOSIS — I10 Essential (primary) hypertension: Secondary | ICD-10-CM | POA: Diagnosis not present

## 2021-09-25 DIAGNOSIS — H93299 Other abnormal auditory perceptions, unspecified ear: Secondary | ICD-10-CM | POA: Diagnosis not present

## 2021-09-25 DIAGNOSIS — E7800 Pure hypercholesterolemia, unspecified: Secondary | ICD-10-CM | POA: Diagnosis not present

## 2021-09-26 ENCOUNTER — Ambulatory Visit: Payer: Self-pay

## 2021-09-26 ENCOUNTER — Encounter: Payer: Self-pay | Admitting: Internal Medicine

## 2021-09-26 NOTE — Progress Notes (Signed)
Pt has moved to [place]. Ended anticoagulation episodes.

## 2021-09-26 NOTE — Telephone Encounter (Signed)
Anticoagulation episodes resolved.

## 2021-09-26 NOTE — Telephone Encounter (Signed)
Tried to contact pt again. Cell number reports call cannot be completed and no answer on home phone. Pt sent msg to provider today reporting she has moved to [place]. Sending mychart msg

## 2022-02-12 ENCOUNTER — Encounter: Payer: Self-pay | Admitting: Internal Medicine

## 2022-04-03 NOTE — Telephone Encounter (Signed)
Sent in

## 2022-04-06 ENCOUNTER — Other Ambulatory Visit: Payer: Self-pay | Admitting: Internal Medicine

## 2022-04-16 ENCOUNTER — Telehealth: Payer: Self-pay | Admitting: Internal Medicine

## 2022-04-16 NOTE — Telephone Encounter (Signed)
N/A unable to leave a message for patient to call back to schedule Medicare Annual Wellness Visit   No hx of AWV eligible as of 05/05/09  Please schedule at anytime with [facility] Advisor if patient calls the office back.     Any questions, please call me at [phone]

## 2022-05-29 ENCOUNTER — Telehealth: Payer: Self-pay

## 2022-05-29 NOTE — Telephone Encounter (Signed)
N/A unable to leave a message for patient to call back to schedule Medicare Annual Wellness Visit.  No hx of AWV eligible as of 05/05/09  Please schedule at anytime with [facility] if patient calls the office back.    30 minute appointment.  Any questions, please call me at [phone]

## 2022-08-22 ENCOUNTER — Telehealth: Payer: PRIVATE HEALTH INSURANCE

## 2022-08-22 NOTE — Telephone Encounter
Call Back Request      Reason for call back: Patient is calling to advise that she does not want to schedule with NP due to living in [place] and not wanting to make multiple trips.    Patient will send referral and send images.    Please assist    Any Symptoms:  []  Yes  [x]  No      If yes, what symptoms are you experiencing:    Duration of symptoms (how long):    Have you taken medication for symptoms (OTC or Rx):      If call was taken outside of clinic hours:    [] Patient or caller has been notified that this message was sent outside of normal clinic hours.     [] Patient or caller has been warm transferred to the physician's answering service. If applicable, patient or caller informed to please call us back if symptoms progress.  Patient or caller has been notified of the turnaround time of 1-2 business day(s).

## 2022-08-25 NOTE — Telephone Encounter
Pt declined scheduling with NP. She advised her referral and records were faxed on 4/19 and she will be uploading images through [employer]. Please advise once received and when available to schedule. Thank you.      Referred by:   MD Name:  Dr. 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 Tresea Mall office to be excluded from this workflow.)    If appt is within 72 hours -  []  Pt will hand-carry medical records and CD to their appt (they must provide 'copies' as they will not be returned to patient.)    If appt is outside 72 hour window -   []  Pt will mail medical records and CD    [employer] Mailing Address UPS/[employer] Mailing Address     Attn: (Recipients Name)  [id number]  [place], [place] [address]   Attn: (Recipients Name)  Medical Receiving                                                             Address: [address]  Room: [address] [place] [address]   Note: Please make sure to inform caller to include the Physician/Recipients name along with the address.    []  Pt will deliver medical records and CD to clinic (they must provide 'copies' as they will not be returned to patient.)    Delivery Address:    [facility]  [address] [place] [address]     [x]  Pt will send medical records and CD electronically (Patient was provided with the following):    Link to [facility] (inform patient that link is only good for 2 days)  Check appropriate physician's group, i.e. ''brain tumor group'', ''[facility] group''  [employer] fax number [phone].       []  Patient is only established with [facility] and all records are in [facility].    Insurance:   [x]  Insurance verified at time of call in CC.   Authorization required:   []  Yes     []  On file     []  Pending   [x]  No     OTA required:   []  Yes    []  On file    []  Pending   [x]  No     Worker's Comp approval required:   []  Yes    []  On file    []  Pending   [x]  No     []  Insurance not verified at time of call in CC.   []  Pt did not have at time of call; instructed to  call back with insurance information.  (inform patient that appt cannot be scheduled until insurance info is obtained and it's  been verified/auth/OTA obtained)   []  Other:    If unable to schedule, select reason:  [x]  No patient-agreed availability  []  MDs do not support Dx   []  Records/Images need MD review   []  Missing records/images  []  Other (Please explain):      Contact person, if other than patient calling  Name:    Relationship to patient:    Phone Number:

## 2022-08-27 NOTE — Telephone Encounter
Message to Practice/Provider      Message:   Pt's has uploaded Imaging through [facility],MR and lumbar puncture report.  Return call is not being requested by the patient or caller.    Patient or caller has been notified of the turnaround time of 1-2 business day(s).

## 2022-08-28 ENCOUNTER — Inpatient Hospital Stay: Payer: PRIVATE HEALTH INSURANCE

## 2022-08-28 DIAGNOSIS — R6889 Other general symptoms and signs: Secondary | ICD-10-CM

## 2022-08-28 NOTE — Telephone Encounter
Images pushed from [facility]

## 2022-08-29 NOTE — Telephone Encounter
Call Back Request      Reason for call back: Patient called to confirm if imaging was received. [facility] confirmed imaging was received. Pt would like to know if she can schedule an appointment now that imaging has been received.    CBN: [phone]    Any Symptoms:  []  Yes  [x]  No      If yes, what symptoms are you experiencing:    Duration of symptoms (how long):    Have you taken medication for symptoms (OTC or Rx):      If call was taken outside of clinic hours:    [] Patient or caller has been notified that this message was sent outside of normal clinic hours.     [] Patient or caller has been warm transferred to the physician's answering service. If applicable, patient or caller informed to please call us back if symptoms progress.  Patient or caller has been notified of the turnaround time of 1-2 business day(s).

## 2022-08-29 NOTE — Telephone Encounter
Lvm for pt informing that I received her message about imaging and confirmed that we have received it. I requested her to call back so that we may proceed in scheduling her an appt with Dr. Threasa Beards.

## 2022-09-01 ENCOUNTER — Telehealth: Payer: PRIVATE HEALTH INSURANCE

## 2022-09-01 NOTE — Telephone Encounter
Spoke with pt and requested to see NP Fleet Contras to review imaging, pt is scheduled

## 2022-09-01 NOTE — Telephone Encounter
Call Back Request      Reason for call back: Patient is calling to see if her images have been reviewed by the MD so that she may schedule an appt.     Any Symptoms:  []  Yes  [x]  No      If yes, what symptoms are you experiencing:    Duration of symptoms (how long):    Have you taken medication for symptoms (OTC or Rx):      If call was taken outside of clinic hours:    [] Patient or caller has been notified that this message was sent outside of normal clinic hours.     [] Patient or caller has been warm transferred to the physician's answering service. If applicable, patient or caller informed to please call us back if symptoms progress.  Patient or caller has been notified of the turnaround time of 1-2 business day(s).

## 2022-09-03 ENCOUNTER — Telehealth: Payer: PRIVATE HEALTH INSURANCE

## 2022-09-03 NOTE — Telephone Encounter
Call Back Request      Reason for call back: Patient states she is returning a call, thank you     Any Symptoms:  []  Yes  [x]  No      If yes, what symptoms are you experiencing:    Duration of symptoms (how long):    Have you taken medication for symptoms (OTC or Rx):      If call was taken outside of clinic hours:    [] Patient or caller has been notified that this message was sent outside of normal clinic hours.     [] Patient or caller has been warm transferred to the physician's answering service. If applicable, patient or caller informed to please call us back if symptoms progress.  Patient or caller has been notified of the turnaround time of 1-2 business day(s).

## 2022-09-03 NOTE — Telephone Encounter
Call Back Request      Reason for call back: Pt called in states she received a call today about 10-15 mins ago, states per the VM their name is [place] Sink and they were letting her know of an email that is being sent but she states she's not received an email. Please contact Pt do not see previous encounter of call being made. Thank you,    Any Symptoms:  []  Yes  [x]  No      If yes, what symptoms are you experiencing:    Duration of symptoms (how long):    Have you taken medication for symptoms (OTC or Rx):      If call was taken outside of clinic hours:    [] Patient or caller has been notified that this message was sent outside of normal clinic hours.     [] Patient or caller has been warm transferred to the physician's answering service. If applicable, patient or caller informed to please call us back if symptoms progress.  Patient or caller has been notified of the turnaround time of 1-2 business day(s).

## 2022-09-08 NOTE — Telephone Encounter
Zoom link sent to patient per patient's request.

## 2022-09-09 ENCOUNTER — Ambulatory Visit: Payer: PRIVATE HEALTH INSURANCE

## 2022-09-09 NOTE — Telephone Encounter
Elizabeth Santana.  Rescheduled appt to Dr. Selena Batten for 05/20  Will email appointment details to ensure that local provider is able to join.   Santana understood that due to being [place] of [place], will need local provider on zoom consult.

## 2022-09-16 ENCOUNTER — Ambulatory Visit: Payer: PRIVATE HEALTH INSURANCE

## 2022-09-22 ENCOUNTER — Ambulatory Visit: Payer: PRIVATE HEALTH INSURANCE

## 2022-10-06 ENCOUNTER — Inpatient Hospital Stay: Payer: PRIVATE HEALTH INSURANCE

## 2022-10-06 ENCOUNTER — Ambulatory Visit: Payer: PRIVATE HEALTH INSURANCE

## 2022-10-06 DIAGNOSIS — R6889 Other general symptoms and signs: Secondary | ICD-10-CM

## 2022-10-06 NOTE — Consults
[facility]  Phone: [phone]  Fax: [phone]   Email: [email or web address]  Department of Neurosurgery. [facility]  [address], [address]  [place], [place] [address]  [email or web address]    PATIENT: Elizabeth Santana       MRN: [id number]       DOB: [birth year]  DATE OF SERVICE: 10/06/2022    REFERRING PRACTITIONER: No ref. provider found  PRIMARY CARE PROVIDER: Madelyn Brunner, MD    Dear Dr. Bonnetta Barry ref. provider found, thank you for referring Elizabeth Santana for neurosurgical consultation.    HISTORY:       VISIT DIAGNOSIS: No diagnosis found.    HISTORY OF PRESENT ILLNESS:    Elizabeth Santana is a 77 y.o. female who is seen in consultation for evaluation of a pituitary lesion.     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 The patient presents today with MRI demonstrating recurrence of her tumor.     The patient endorses some disequilibrium/balance issues. She states that fluid started coming out of her left ear over 6 months ago after she moved from the [facility]. She had beta 2 transferrin sent, which was positive. She saw ENT that placed a patch to stop the leaking. It worked partially and starteed coming out the nose. Endorses chills but no fevers. Some suboccipital headaches/shoulder pain. Some retroorbital pain.       ENDOCRINE-RELATED SYMPTOMATOLOGY ROS:    GONADOTROPH-LACTOTROPH AXIS    Menstrual Irregularities: Postmenopausal  Libido: Not asked  Galactorrhea: None  SOMATOTROPH AXIS    Acromegaly symptoms: None apparent  GH deficiency: No apparent symptoms  CORTICOTROPH AXIS    Hypercortisolism symptoms: None apparent  Cortisol deficiency symptoms: None apparent                 THYROTROPH AXIS    Hyperthyroidism symptoms: None apparent  Hypothyroidism symptoms: None apparent POSTERIOR PITUITARY    DI symptoms: None  SIADH history:  None  MEN-1 SCREEN                MASS EFFECT-RELATED ROS:    HEADACHE    Present, but a minor complaint  Severity:   Chronicity:   VISUAL    Changes in visual acuity   Changes in peripheral vision:   Double vision:   Formal Visual Field Testing:              Visual Field Results            OS                                        OD     PRIOR PITUITARY TUMOR TREATMENT HISTORY:    SURGERY    TNTS in 2010  PHARMACOLOGIC                 RADIATION    None  OTHER              DIAGNOSTIC STUDIES:       PITUITARY-RELATED DIAGNOSTIC STUDIES      /QUEST    OUTSIDE LABORATORY     Prolactin    09/25/2022 16.7   LH    09/25/2022 3.4  FSH    09/25/2022 12.8   Testosterone    09/25/2022    fT4    09/25/2022 8.4   TSH    09/25/2022 0.67   GH    09/25/2022    IGF-1    09/25/2022 51   Cortisol    09/25/2022 08:45 23.9 (H)   ACTH    09/25/2022    24 hr UFC       Salivary cortisol       Other (IPSS, OGTT)         NEUROIMAGING    Review: 09/26/2022    Date  MRI  Date Date Date   09/26/2022                  Tumor size (mm)  23.2x18.1                Sidedness (L/R): Midline  Sidedness (A/P):        Lesion enhancement: Heterogeneously enhancing  Special characteristics:        Apoplexy:   Cavernous sinus involvement:        Suprasellar component:   Optic chiasm compression: abutting chiasm       Second sellar lesion:   Sphenoid sinus:          Key Images        Coronal T1 +c    Sagittal T1 +c    Other      OTHER HISTORY:     MEDICATIONS:    Current Outpatient Medications:     ALPRAZolam 0.5 mg tablet, Take 1 tablet (0.5 mg total) by mouth as needed for., Disp: , Rfl:     apixaban (ELIQUIS) 5 mg tablet, Take 1 tablet (5 mg total) by mouth two (2) times daily., Disp: , Rfl:     DULoxetine 60 mg DR capsule, 1 capsule (60 mg total) daily., Disp: , Rfl:     furosemide 40 mg tablet, Take 1 tablet (40 mg total) by mouth two (2) times daily., Disp: , Rfl:     lisinopril 5 mg tablet, Take 1 tablet (5 mg total) by mouth daily., Disp: , Rfl:     potassium chloride 20 mEq ER tablet, Take 2 tablets (40 mEq total) by mouth two (2) times daily., Disp: , Rfl:     simvastatin 40 mg tablet, Take 1 tablet (40 mg total) by mouth at bedtime., Disp: , Rfl:       ALLERGIES:   Allergies   Allergen Reactions    Gabapentin      Other Reaction(s): Nausea Only    Dizziness,     Dizziness,    Losartan      Other Reaction(s): Swelling    Atorvastatin      Instability, dizzy    Duloxetine Hcl Itching     Other Reaction(s): Nausea Only    headaches    Ezetimibe     Lisinopril Cough    Other      Other Reaction(s): Other (See Comments)    REACTION: shortness of breath   hives    Peanut Oil      Other Reaction(s): Other (See Comments)    REACTION: hives    REACTION: hives   REACTION: hives    Shrimp      hives    Shrimp Flavor      hives    Simvastatin     Sulfa Antibiotics      REACTION: shortness of breath    Lovastatin  Other Reaction(s): Other (See Comments)    Leg swelling    Pravastatin Itching     Itching all over    Rosuvastatin      Other Reaction(s): Other (See Comments)    headaches,  muscle pain, weakness, dizziness, lack of energy,  and constipation       PAST MEDICAL HISTORY:  No past medical history on file.  PAST SURGICAL HISTORY:  No past surgical history on file.   FAMILY HISTORY:   No family history on file.  SOCIAL HISTORY:       Intake ROS reviewed      PHYSICAL AND NEUROLOGICAL EXAM:       Photographs                        ASSESSMENT AND RECOMMENDATIONS:     77 y.o. year-old female with a history of pituitary adenoma now with recurrence. Of more concern is that she has CSF otorrhea that was temporized with TM packing and now has converted to CSF rhinorrhea. It appears that she has thinning over her tegmen and semicircular canals, possibly secondary to increased pressure from her left sided strokes. Regardless, she has mastoid effusion and otorrhea/rhinorrhea that needs treatment prior to her pituitary adenoma     CLINICAL ASSESSMENT:     We will refer to Dr. Threasa Beards and Baird Cancer for evaluation and tegmen dehiscence repair. Following this we will determine if she has any issues with hydrocephalus or increased intracranial pressure that may have contributed to this dehiscence. We will address these issues prior to any pituitary surgery.          Dr. No ref. provider found, thank you for your confidence in allowing me to share in the care of your patient. I send, of course, my very best personal regards.    Author:     Aquilla Hacker, MD  Associate Clinical Professor  [facility] Department of Neurosurgery    [facility]  [address], [address] floor  [place] [address]   [phone] (Appointments)  [phone] (Fax)  ______________________________________    [email or web address]  [email or web address]          Total time in face-to-face counselling and consultation with the patient was approximately 45 minutes. HISTORY:   No family history on file.  SOCIAL HISTORY:       Intake ROS reviewed      PHYSICAL AND NEUROLOGICAL EXAM:       Vitals: There were no vitals taken for this visit.  General: {Exam; general:16600::''alert'',''appears stated age'',''cooperative''}   Skin: {Blank single:19197::''Hyperpigmentation'',''Significant acne'',''Multiple areas of bruising are evident'',''The skin is thin and transparent'',''Skin color, texture, turgor normal. No rashes or lesions''}  Head: {Blank single:19197::''Rubral complexion, rounded face'',''Coarse features, enlarged jaw'',''Normocephalic, without obvious abnormality''}   Mouth: {Blank single:19197::''No perioral or gingival cyanosis or lesions'',''Tongue is enlarged'',''Tongue is normal in appearance.''}  Eyes: {Exam; eye:201::''Conjunctivae clear''}  Ears: {Exam; ear:5207::''Normal external ear canals both ears''}   Dentition: {Blank single:19197::''Teeth spacing is widened'',''Teeth are normally spaced''}  Nose: {Exam; nose:13771::''Nares normal. Septum midline. Mucosa normal. No drainage or sinus tenderness.''}   Abdomen: {Blank single:19197::''Marked truncal obesity'',''No straie'',''Nonviolaceous straie'',''Violaceous striae'',''soft, non-tender''}  Throat: {Exam, oropharynx:17160::''lips, mucosa normal; teeth and gums normal''}  Neck: {Exam; neck:17463::''supple, symmetrical, trachea midline''}   Extremities: {Blank single:19197::''Proximal atrophy'',''enlarged doughy hands and feet'',''atraumatic, no cyanosis or edema''}  Back: {Exam; back:801::''ROM normal''}  Lungs: {Exam; lung:16931::''clear to auscultation bilaterally''}        Neurologic: {Exam; neuro:17854::''Alert,  oriented x 3. Language function intact. CN 3-12 grossly intact. EOMI. Sensory exam intact to LT. Gait normal. Reflexes normal''} {Blank single:19197::''Motor strength normal except for proximal LE weakness'',''Motor full strength throughout''}  Heart: {Exam; heart:5510::''regular rate and rhythm''}     GU: {Exam; genital:16937::''deferred''}        Visual Field Exam: {Blank single:19197::''Subtle bitemporal hemianopsia'',''Bitemporal hemianopsia R>L'',''Bitemporal hemianopsia L>R'',''Mild bitemporal hemianopsia'',''Dense bitemporal hemianopsia'',''Bitemporal hemianopsia'',''Normal to confrontation''}  Musculoskeletal: {Exam; musculoskeletal:803::''negative'',''unremarkable''}   Fundiscopy: {Blank single:19197::''Disc pallor OD'',''Disc pallor OS'',''Normal'',''Not performed''}  Psychiatric: {Blank single:19197::''odd affect'',''anxious'',''depressed'',''pressured speech'',''mood and affect are within normal limits''}       Photographs                        ASSESSMENT AND RECOMMENDATIONS:     77 y.o. year-old female {Blank single:19197::''with a reported abnormal pituitary MRI. ***'',''with a pituitary abnormality on MRI of unclear etiology. ***'',''who presents for management recommendations of a known pituitary lesion. ***'',''who presents with a newly diagnosed pituitary lesion. ***''}.     CLINICAL ASSESSMENT:     The clinical findings, laboratory evaluation, and imaging studies support the following: {Blank single:19197::''Insufficient data to establish at this time.'',''The imaging studies do not show a pituitary lesion.'',''Possible'',''Definite'',''Probable''} {Blank single:19197::''Further work-up is required'',''There is low clinical suspicion of a pituitary adenoma'',''pituitary carcinoma'',''pituitary gigantism'',''thyrotropinoma'',''Nelson's disease'',''craniopharyngioma'',''Rathkes cleft cyst (RCC)'',''Cushing's disease'',''acromegaly'',''prolactinoma'',''clinically nonfunctional pituitary adenoma''}     {Blank single:19197::''Based on our review of the imaging, there is no evidence of pituitary pathology. I do not see a need for further neurosurgical management at this time'',''I will refer the patient to Dr. Tonia Ghent [facility]) for consultation and further evaluation'',''Craniopharyngioma, insert APCRANIOPHARYNGIOMA'',''Other skullbase tumor, insert APSKULLBASETUMOR'',''Thyrotropinoma, insert APTHYROTROPINOMA'',''Nelsons disease, insert APNELSONS'',''RCC, insert APRCC'',''Cushings disease, insert APCUSHINGS'',''Acromegaly, insert APACROMEGALY'',''Prolactinoma, insert APPROLACTIOMA'',''CNF, insert APCNF''}     Dr. No ref. provider found, thank you for your confidence in allowing me to share in the care of your patient. I send, of course, my very best personal regards.    Author:     Aquilla Hacker, MD  Associate Clinical Professor  [facility] Department of Neurosurgery    [facility]  [address] 4th [address]  [place] [address]   [phone] (Appointments)  [phone] (Fax)  ______________________________________    [email or web address]  [email or web address]          Total time in face-to-face counselling and consultation with the patient was approximately {Blank single:19197::''30'',''45'',''60''} minutes.

## 2022-10-07 ENCOUNTER — Telehealth: Payer: PRIVATE HEALTH INSURANCE

## 2022-10-07 DIAGNOSIS — G96 CSF leak: Secondary | ICD-10-CM

## 2022-10-07 DIAGNOSIS — E237 Disorder of pituitary gland, unspecified: Secondary | ICD-10-CM

## 2022-10-07 NOTE — Telephone Encounter
Call Back Request      Reason for call back: Patient calling to advise she called ENT and tried to schedule apt and they don't have anything until December. She is not sure if Dr. Selena Batten is ok with her waiting until then? Please confirm, thank you.     CBN [phone]    Any Symptoms:  []  Yes  [x]  No      If yes, what symptoms are you experiencing:    Duration of symptoms (how long):    Have you taken medication for symptoms (OTC or Rx):      If call was taken outside of clinic hours:    [] Patient or caller has been notified that this message was sent outside of normal clinic hours.     [] Patient or caller has been warm transferred to the physician's answering service. If applicable, patient or caller informed to please call us back if symptoms progress.  Patient or caller has been notified of the turnaround time of 1-2 business day(s).

## 2022-10-08 ENCOUNTER — Telehealth: Payer: PRIVATE HEALTH INSURANCE

## 2022-10-08 NOTE — Telephone Encounter
Lvm for pt informing of appt per Dr. Threasa Beards request, left appt details and informed that [facility] team will be contacting her regarding pcp joining call

## 2022-10-08 NOTE — Telephone Encounter
Confirmed appt, stated that I sent fcu a message to clear her auth for tomorrow but that she doe not need pcp to join appt since she has come in person before.

## 2022-10-08 NOTE — Telephone Encounter
Sw patient via email. Patient currently scheduled for July 17th at 8am.   Dr. Lorelei Pont team will assist with scheduling sooner appt.

## 2022-10-09 ENCOUNTER — Telehealth: Payer: PRIVATE HEALTH INSURANCE

## 2022-10-09 DIAGNOSIS — G96 CSF leak: Secondary | ICD-10-CM

## 2022-10-09 NOTE — Progress Notes
PATIENTVivan Santana   MRN: [id number]  DOB: March 17, 1946  DATE OF SERVICE: 10/09/2022     Verbal consent was obtained from the patient to proceed with the telemedicine consultation via a secure, HIPAA compliant web interface. Patient expressed full understanding that the interaction will remain strictly confidential and that the patient has the right to stop the session immediately, if he/she wishes.     Neurosurgery Telemedicine Progress Note     ID:  Patient is 77 y.o. 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 The patient recently consulted with Dr. Aquilla Hacker of Neurosurgery last seen on 10/06/2022 at which time patient reported disequilibrium and balance issues, as well as fluid drainage from the left ear over 6 months ago, concerning for left ear infection for which she was started on a course of antibiotics. She had beta 2 transferrin sent, which was positive. She consulted with ENT that placed a patch to stop the leaking. It worked partially and started coming out the nose. 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 Given this, I had the pleasure of speaking with the patient today via telemedicine conference for evaluation and discussion of treatment recommendations jointly with Dr. Baird Cancer of Head and Neck Surgery, referred by Dr. Aquilla Hacker.     Physical Exam:   There were no vitals taken for this visit.  General: No acute distress.  Neurological: Awake, attentive and interactive.    Medications  No outpatient medications have been marked as taking for the 10/09/22 encounter (Appointment) with Darol Destine, MD.         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      Assessment:   77 y.o. 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 The patient recently consulted with Dr. Aquilla Hacker of Neurosurgery last seen on 10/06/2022 at which time patient reported disequilibrium and balance issues, as well as fluid drainage from the left ear over 6 months ago, concerning for left ear infection for which she was started on a course of antibiotics. She had beta 2 transferrin sent, which was positive. She consulted with ENT that placed a patch to stop the leaking. It worked partially and started coming out the nose. 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 Given this, I had the pleasure of speaking with the patient today via telemedicine conference for evaluation and discussion of treatment recommendations jointly with Dr. Baird Cancer of Head and Neck Surgery, referred by Dr. Aquilla Hacker.   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 Based on the patient's clinical presentation, we discussed that it is unclear where the source of the reported CSF leak is located. We discussed the various treatment options of conservative/ non-operative management, versus surgical intervention (left  mastoidectomy versus middle cranial fossa craniotomy for repair of CSF leak), including the risks and benefits of each option. I will defer to my colleague Dr. Aquilla Hacker of Neurosurgery for management recommendations for the pituitary adenoma, as this is his expertise. I discussed the different approaches for repair of CSF leak including left mastoidectomy (safest approach although lower efficacy), compared to a middle cranial fossa approach which I would not recommend due to increased risk of stroke in light of the patient's history. After careful consideration of the options, given the patient's clinical presentation, medical history notable for stroke, and the radiographic findings, I recommend surgical intervention for repair of CSF leak via a left mastoidectomy.  I explained the procedure for a left mastoidectomy in great detail, including the risks and benefits of this procedure. I discussed the risks of surgery which include but are not limited to bleeding, infection, and neurological deficits. I discussed the expected recovery, including the hospital stay, limitations, and the post-operative care. Dependent on response and if failure to repair CSF leak, considerations may need to be made for a left middle cranial fossa craniotomy for repair of CSF leak.   Having understood the aforementioned risks and benefits of the procedure, the patient would like to proceed with surgery, which we will schedule through my office. The patient has expressed an understanding and agreement of the plan. All questions were answered.  Plan:   - Will defer to Dr. Emilio Math office to schedule surgery for left mastoidectomy    45 minutes were spent personally by me today on this encounter which include today's pre-visit review of the chart, obtaining appropriate history, performing an evaluation, documentation and discussion of management with details supported within the note for today's visit. The time documented was exclusive of any time spent on the separately billed procedure.    SCRIBE SIGNATURE(S):   I, Bambie Pizzolato Alohilani Taren Toops, have assisted Darol Destine, MD with the documentation for Elizabeth Santana on 10/09/2022 at 1:29 PM.    PHYSICIAN SIGNATURE(S):  Darol Destine, MD  10/09/2022 1:29 PM    I have reviewed this note, written by Hanya Guerin Alohilani Tiane Szydlowski and attest that it is an accurate representation of my H & P and other events of the outpatient visit except if otherwise noted.

## 2022-10-13 ENCOUNTER — Ambulatory Visit: Payer: PRIVATE HEALTH INSURANCE

## 2022-10-13 ENCOUNTER — Inpatient Hospital Stay: Payer: PRIVATE HEALTH INSURANCE

## 2022-10-13 DIAGNOSIS — G96 CSF leak: Secondary | ICD-10-CM

## 2022-10-13 NOTE — Telephone Encounter
Contacted pt and scheduled procedure with Dr Baird Cancer and Dr. Threasa Beards

## 2022-10-14 ENCOUNTER — Telehealth: Payer: PRIVATE HEALTH INSURANCE

## 2022-10-14 NOTE — Telephone Encounter
Call Back Request      Reason for call back: Patient asking if she needs to see the ENT doctor before her surgery, please assist, thank you    Any Symptoms:  []  Yes  []  No      If yes, what symptoms are you experiencing:    Duration of symptoms (how long):    Have you taken medication for symptoms (OTC or Rx):      If call was taken outside of clinic hours:    [] Patient or caller has been notified that this message was sent outside of normal clinic hours.     [] Patient or caller has been warm transferred to the physician's answering service. If applicable, patient or caller informed to please call us back if symptoms progress.  Patient or caller has been notified of the turnaround time of 1-2 business day(s).

## 2022-10-15 NOTE — Telephone Encounter
Left message for patient to confirm that she met with Dr Baird Cancer previously.

## 2022-10-15 NOTE — Telephone Encounter
Call Back Request      Reason for call back: Patient would like a call back regarding the encounter below. Per patient has previously had a joint zoom appointment with Dr. Baird Cancer and Dr. Threasa Beards.     Any Symptoms:  []  Yes  [x]  No      If yes, what symptoms are you experiencing:    Duration of symptoms (how long):    Have you taken medication for symptoms (OTC or Rx):      If call was taken outside of clinic hours:    [] Patient or caller has been notified that this message was sent outside of normal clinic hours.     [] Patient or caller has been warm transferred to the physician's answering service. If applicable, patient or caller informed to please call us back if symptoms progress.  Patient or caller has been notified of the turnaround time of 1-2 business day(s).

## 2022-10-15 NOTE — Telephone Encounter
Spoke with patient and confirmed that patient saw Dr Baird Cancer with Dr Threasa Beards jointly earlier this month. All questions answered.

## 2022-10-16 ENCOUNTER — Ambulatory Visit: Payer: PRIVATE HEALTH INSURANCE

## 2022-10-28 ENCOUNTER — Telehealth: Payer: PRIVATE HEALTH INSURANCE

## 2022-10-28 NOTE — Telephone Encounter
Call Back Request      Reason for call back: Patient would like to know if there are any sooner surgery dates? She is currently scheduled for surgery in August.     Please assist, thank you.     CBN: [phone]    Any Symptoms:  []  Yes  [x]  No      If yes, what symptoms are you experiencing:    Duration of symptoms (how long):    Have you taken medication for symptoms (OTC or Rx):      If call was taken outside of clinic hours:    [] Patient or caller has been notified that this message was sent outside of normal clinic hours.     [] Patient or caller has been warm transferred to the physician's answering service. If applicable, patient or caller informed to please call us back if symptoms progress.  Patient or caller has been notified of the turnaround time of 1-2 business day(s).

## 2022-10-30 NOTE — Telephone Encounter
Offered patient possibly date for July 18th but at time, patient will keep the date in August.

## 2022-11-10 ENCOUNTER — Ambulatory Visit: Payer: PRIVATE HEALTH INSURANCE

## 2022-11-10 DIAGNOSIS — G96 CSF leak: Secondary | ICD-10-CM

## 2022-11-12 ENCOUNTER — Ambulatory Visit: Payer: PRIVATE HEALTH INSURANCE

## 2022-11-19 ENCOUNTER — Ambulatory Visit: Payer: PRIVATE HEALTH INSURANCE

## 2022-11-26 ENCOUNTER — Ambulatory Visit: Payer: PRIVATE HEALTH INSURANCE

## 2022-12-08 ENCOUNTER — Ambulatory Visit: Payer: PRIVATE HEALTH INSURANCE

## 2022-12-09 ENCOUNTER — Ambulatory Visit: Payer: PRIVATE HEALTH INSURANCE

## 2022-12-22 ENCOUNTER — Telehealth: Payer: PRIVATE HEALTH INSURANCE

## 2022-12-23 NOTE — Telephone Encounter
Left message for patient that per Dr Gillian Shields Threasa Beards. Patient does not need to hold her elliquis prior to surgery. Will also let patient know in writing.

## 2022-12-31 ENCOUNTER — Ambulatory Visit: Payer: PRIVATE HEALTH INSURANCE

## 2022-12-31 ENCOUNTER — Inpatient Hospital Stay: Payer: PRIVATE HEALTH INSURANCE

## 2022-12-31 DIAGNOSIS — G96 CSF leak: Secondary | ICD-10-CM

## 2023-01-01 ENCOUNTER — Inpatient Hospital Stay: Payer: PRIVATE HEALTH INSURANCE

## 2023-01-01 ENCOUNTER — Ambulatory Visit: Payer: PRIVATE HEALTH INSURANCE

## 2023-01-01 LAB — Potassium,POC: POTASSIUM,POC: 3.4 mmol/L — ABNORMAL LOW (ref 3.6–5.3)

## 2023-01-01 LAB — Ionized Calcium,POC: IONIZED CA,CORR,POC: 1.2 mmol/L (ref 1.09–1.29)

## 2023-01-01 LAB — CO2,POC: CO2,POC: 21 mmol/L (ref 20–30)

## 2023-01-01 LAB — Blood Gases, arterial,POC: PO2, ARTERIAL,POC: 162 mm[Hg] — ABNORMAL HIGH (ref 98–118)

## 2023-01-01 LAB — Sodium,POC: SODIUM,POC: 141 mmol/L (ref 135–146)

## 2023-01-01 LAB — Chloride,POC: CHLORIDE,POC: 107 mmol/L — ABNORMAL HIGH (ref 96–106)

## 2023-01-01 LAB — LACTATE, POC: LACTATE, POCT: 14 mg/dL (ref 5–18)

## 2023-01-01 LAB — Glucose,POC: GLUCOSE,POC: 117 mg/dL — ABNORMAL HIGH (ref 65–99)

## 2023-01-01 MED ADMIN — PROPOFOL 200 MG/20ML IV EMUL: INTRAVENOUS | @ 19:00:00 | Stop: 2023-01-01 | NDC 63323026929

## 2023-01-01 MED ADMIN — SODIUM CHLORIDE 0.9 % IR SOLN: @ 18:00:00 | Stop: 2023-01-02 | NDC 00338004804

## 2023-01-01 MED ADMIN — HYDROMORPHONE HCL 2 MG/ML IJ SOLN: INTRAVENOUS | @ 17:00:00 | Stop: 2023-01-01 | NDC 00641615101

## 2023-01-01 MED ADMIN — PHENYLEPHRINE HCL (PRESSORS) 10 MG/ML IV SOLN: INTRAVENOUS | @ 16:00:00 | Stop: 2023-01-01 | NDC 23155062041

## 2023-01-01 MED ADMIN — HYDROMORPHONE HCL 2 MG/ML IJ SOLN: INTRAVENOUS | @ 16:00:00 | Stop: 2023-01-01 | NDC 00641615101

## 2023-01-01 MED ADMIN — PLASMA-LYTE A IV SOLN: @ 15:00:00 | Stop: 2023-01-01

## 2023-01-01 MED ADMIN — PROPOFOL 200 MG/20ML IV EMUL: INTRAVENOUS | @ 17:00:00 | Stop: 2023-01-01 | NDC 63323026929

## 2023-01-01 MED ADMIN — BACITRACIN ZINC 500 UNIT/GM EX OINT: @ 19:00:00 | Stop: 2023-01-02 | NDC 51672207501

## 2023-01-01 MED ADMIN — GADOBUTROL 1 MMOL/ML IV SOLN: 10 mL | INTRAVENOUS | @ 06:00:00 | Stop: 2023-01-01 | NDC 65219028110

## 2023-01-01 MED ADMIN — SUCCINYLCHOLINE CHLORIDE 20 MG/ML IJ SOLN: INTRAVENOUS | @ 15:00:00 | Stop: 2023-01-01 | NDC 00781341195

## 2023-01-01 MED ADMIN — PROPOFOL 200 MG/20ML IV EMUL: INTRAVENOUS | @ 16:00:00 | Stop: 2023-01-01 | NDC 63323026929

## 2023-01-01 MED ADMIN — PHENYLEPHRINE HCL (PRESSORS) 10 MG/ML IV SOLN: INTRAVENOUS | @ 18:00:00 | Stop: 2023-01-01 | NDC 23155062041

## 2023-01-01 MED ADMIN — DEXAMETHASONE SODIUM PHOSPHATE 4 MG/ML IJ SOLN: INTRAVENOUS | @ 16:00:00 | Stop: 2023-01-01 | NDC 67457042312

## 2023-01-01 MED ADMIN — PROPOFOL 200 MG/20ML IV EMUL: INTRAVENOUS | @ 15:00:00 | Stop: 2023-01-01 | NDC 63323026929

## 2023-01-01 MED ADMIN — HYDROMORPHONE HCL 1 MG/ML IJ SOLN: .4 mg | INTRAVENOUS | @ 20:00:00 | Stop: 2023-01-02 | NDC 00409128331

## 2023-01-01 MED ADMIN — DEXMEDETOMIDINE HCL 200 MCG/2ML IV SOLN: INTRAVENOUS | @ 17:00:00 | Stop: 2023-01-01 | NDC 71288050503

## 2023-01-01 MED ADMIN — LIDOCAINE HCL (CARDIAC) 100 MG/5ML IV SOSY: INTRAVENOUS | @ 16:00:00 | Stop: 2023-01-01 | NDC 76329339001

## 2023-01-01 MED ADMIN — PLASMA-LYTE A IV SOLN: INTRAVENOUS | @ 19:00:00 | Stop: 2023-01-01 | NDC 00338022104

## 2023-01-01 MED ADMIN — PHENYLEPHRINE HCL (PRESSORS) 10 MG/ML IV SOLN: INTRAVENOUS | @ 15:00:00 | Stop: 2023-01-01 | NDC 23155062041

## 2023-01-01 MED ADMIN — FENTANYL CITRATE (PF) 100 MCG/2ML IJ SOLN: 50 ug | INTRAVENOUS | @ 20:00:00 | Stop: 2023-01-02 | NDC 00409909412

## 2023-01-01 MED ADMIN — ACETAMINOPHEN 10 MG/ML IV SOLN: 1000 mg | INTRAVENOUS | @ 18:00:00 | Stop: 2023-01-01 | NDC 00264410090

## 2023-01-01 MED ADMIN — CEFAZOLIN SODIUM 1 G IJ SOLR: INTRAVENOUS | @ 16:00:00 | Stop: 2023-01-01 | NDC 00143992490

## 2023-01-01 MED ADMIN — SUCCINYLCHOLINE CHLORIDE 20 MG/ML IJ SOLN: INTRAVENOUS | @ 16:00:00 | Stop: 2023-01-01 | NDC 00781341195

## 2023-01-01 MED ADMIN — ONDANSETRON HCL 4 MG/2ML IJ SOLN: INTRAVENOUS | @ 19:00:00 | Stop: 2023-01-01 | NDC 60505613005

## 2023-01-01 MED ADMIN — FENTANYL CITRATE (PF) 100 MCG/2ML IJ SOLN: 50 ug | INTRAVENOUS | @ 19:00:00 | Stop: 2023-01-02 | NDC 00409909412

## 2023-01-01 MED ADMIN — BUPIVACAINE-EPINEPHRINE (PF) 0.25% -1:200000 IJ SOLN: @ 17:00:00 | Stop: 2023-01-02 | NDC 00409174610

## 2023-01-01 MED ADMIN — PLASMA-LYTE A IV SOLN: INTRAVENOUS | @ 15:00:00 | Stop: 2023-01-01 | NDC 00338022104

## 2023-01-01 MED ADMIN — DERMABOND MINI SKIN ADHESIVE: @ 18:00:00 | Stop: 2023-01-02 | NDC 08252114512

## 2023-01-01 MED ADMIN — EPINEPHRINE PF 1 MG/ML IJ SOLN: @ 17:00:00 | Stop: 2023-01-02 | NDC 54288010310

## 2023-01-01 MED ADMIN — FENTANYL CITRATE (PF) 100 MCG/2ML IJ SOLN: INTRAVENOUS | @ 15:00:00 | Stop: 2023-01-01 | NDC 00409909422

## 2023-01-01 MED ADMIN — HYDROMORPHONE HCL 2 MG/ML IJ SOLN: INTRAVENOUS | @ 19:00:00 | Stop: 2023-01-01 | NDC 00641615101

## 2023-01-01 NOTE — Brief Op Note
Brief Operative/Procedure Note    Patient: Elizabeth Santana    Date of Operation(s)/Procedure(s): 01/01/2023    Pre-op Diagnosis: CSF leak       Post-op Diagnosis: CSF leak    Operation(s)/Procedure(s):  Repair of Encephalocele  MASTOIDECTOMY / MASTOID OBLIERATION / LEFT ENCEPHALOCELE REPAIR /ABDOMINAL FAT GRAFT    Surgeons and Role:  Panel 1:     Darol Destine, MD - Primary     * Dirisio, Spero Geralds., MD - Surgeon 1st Assist - Resident     * Dory Peru, Seward Meth., MD - Surgeon 1st Assist - Resident  Panel 2:     * Ralph Dowdy., MD - Primary    Anesthesia Staff and Role:  Anesthesia Resident: Franchot Gallo., MD  Anesthesiologist: Reinaldo Raddle, MD    Anesthesia Type:   General    Pre-Op Medications: ancef, decadron    Intra-op Medications: (Antibiotics, Anticoagulants, Immunosuppressants)  Administrations occurring from 0730 to 1349 on 01/01/23:   Medication Name Total Dose   sodium chloride 0.9% irrigation soln 3,000 mL   EPINEPHrine PF (Adrenalin) 1 mg/mL inj 10 mL   BUPivacaine-EPINEPHrine PF 0.25%-1:200000 inj 10 mL   dermabond mini skin adhesive 1 tube   bacitracin 500 unit/g ointment 1 inch   acetaminophen IV inj 1,000 mg 1,000 mg   plasma-lyte-A IV soln Cannot be calculated       Blood Products: none    Fluids: per anesthesia    Estimated Blood Loss: Minimal    Findings: repair of CSF leak, repair of encephalocele    Complications: None; patient tolerated the operation(s)/procedure(s) well.                 Specimens:   ID Type Source Tests Collected by Time   1 : Left encephalocele Tissue Head TISSUE EXAM/FOREIGN BODY (AP) Darol Destine, MD 01/01/2023 1041       Drains:   Urethral Catheter Standard;Latex 16 Fr. (Active)            Staff and Role:   Circulating Nurse: Arlana Hove, RN; [address] Delene Loll, RN  Scrub Person: Ron Agee Man, RN; Dorris Carnes  Neurophysiology Tech: Alcario Drought  Circul PG Nurse: Lowella Dandy., RN  Chaperone: Ron Agee Man, RN    Seward Meth. Dory Peru, MD    Date: 01/01/2023  Time: 11:55 AM

## 2023-01-01 NOTE — H&P
HEAD AND NECK SURGERY HISTORY AND PHYSICAL     PATIENT:  Elizabeth Santana  MRN:  [id number]  DOB:  November 03, 1945  DATE OF SERVICE:  12/31/2022    History of Present Illness:  Elizabeth Santana is a 77 y.o. female who presents today for mastoidectomy, tympanoplasty, CSF leak repair, possible LD.  Review of Systems:   The patient was asked and responded to a 14 point review of systems regarding constitutional symptoms, eye symptoms, ears, nose, mouth, throat symptoms, cardiovascular symptoms, respiratory symptoms, gastrointestinal symptoms, genitourinary symptoms, musculoskeletal symptoms, integumentary symptoms, neurological symptoms, psychiatric symptoms, endocrine symptoms, hematologic/lymphatic symptoms, and allergic/ immunologic symptoms. Pertinent factors have been included in the HPI, and the review of systems is otherwise negative.     Past Medical History:   Past Medical History:   Diagnosis Date    Anxiety     Balance disorder     Brain tumor (benign) (HCC/RAF)     Chronic pain     CSF leak     Depression     GERD (gastroesophageal reflux disease)     Headache     Hearing loss     Hyperlipidemia     Hypertension     Lupus (HCC/RAF)     Post-operative nausea and vomiting     Stroke (HCC/RAF)     Vision abnormalities      Past Surgical History:   Past Surgical History:   Procedure Laterality Date    HYSTERECTOMY  1998    PITUITARY SURGERY  2010    WRIST FRACTURE SURGERY Left 2021     Family History:   History reviewed. No pertinent family history.  Social History:   Social History     Socioeconomic History    Marital status: Married   Tobacco Use    Smoking status: Never    Smokeless tobacco: Never   Substance and Sexual Activity    Alcohol use: Yes     Comment: 2 glasses of wine every 2-3 months.    Drug use: Never   Other Topics Concern    Do you need help to transfer to a chair? No    Can you walk outside your home with or without assistance? Yes    Has your current physical state been stable for 4-6 months? No Do you use any assistive devices (cane, walker, wheelchair)? Yes    Have you had any falls within the last 3 months? Yes    Has your current physical state been stable for more than 6 months? No    Can you walk in your home with or without assistance? No    Has your current physical state been stable for 0-3 months? No    Do you now, or have you in the past, taken medication for anxiety? Yes    Do you have a history of drug abuse/dependence (prescription meds,cocaine,amphetamines,heroin)? List drugs in comments No    Do you now, or have in the past, had a problem with chronic pain (pain lasting longer than 3 months for which you sought help of a physician)? Yes    Do you use marijuana? No    Do you now, or have you in the past, taken any pain meds (other than Tylenol or Advil) on a regular basis? List drugs in comments No     Allergies:   Gabapentin, Losartan, Atorvastatin, Duloxetine hcl, Ezetimibe, Lisinopril, Other, Peanut oil, Shrimp, Shrimp flavor, Simvastatin, Sulfa antibiotics, Lovastatin, Pravastatin, and Rosuvastatin   Current medications:  No current facility-administered medications for this encounter.    Current Outpatient Medications:     ALPRAZolam 0.5 mg tablet, Take 1 tablet (0.5 mg total) by mouth as needed for., Disp: , Rfl:     apixaban (ELIQUIS) 5 mg tablet, Take 1 tablet (5 mg total) by mouth two (2) times daily., Disp: , Rfl:     DULoxetine 60 mg DR capsule, 1 capsule (60 mg total) daily., Disp: , Rfl:     ezetimibe 10 mg tablet, Take 0.5 tablets (5 mg total) by mouth daily., Disp: , Rfl:     furosemide 40 mg tablet, Take 1 tablet (40 mg total) by mouth two (2) times daily., Disp: , Rfl:     lisinopril 5 mg tablet, Take 1 tablet (5 mg total) by mouth daily., Disp: , Rfl:     omeprazole 20 mg DR capsule, Take 1 capsule (20 mg total) by mouth daily., Disp: , Rfl:     potassium chloride 20 mEq ER tablet, Take 2 tablets (40 mEq total) by mouth two (2) times daily., Disp: , Rfl:     simvastatin 40 mg tablet, Take 1 tablet (40 mg total) by mouth at bedtime., Disp: , Rfl:     Objective:    Physical Examination:  Vitals:  There were no vitals taken for this visit.   General:  No acute distress. Alert and oriented x4.   HEENT: Extraocular muscles in tact.  Moist mucous membranes. Trachea midline.   Respiratory: No increased work of breathing. Symmetric chest expansion.  Cardiovascular: No ectopy on monitor.  Abdomen: Non-distended.   Extremities: Warm, well-perfused.       Assessment/Plan:  Elizabeth Santana is a 77 y.o. female who presents today for mastoidectomy, tympanoplasty, CSF leak repair, possible LD.    There have been no major changes in medical history. Patient to proceed to scheduled surgery.    Please page Head and Neck Surgery Service [phone] with any questions or concerns.    Author:    Noreene Larsson. Cathie Hoops, MD, PGY-3  Head and Neck Surgery    12/31/2022 9:23 PM

## 2023-01-01 NOTE — Op Note
Elizabeth Santana is a 77 y.o. female      Mrn  [id number]    Dob  1945-09-19      Surgery Date  01/01/2023             Operation Title(s)   1. The patient had a Left infratemporal post auricular approach including mastoidectomy with exposure of the external auditory canal with resection of posterior cranial fossa meningocele and scondary repair of dura for CSF leak reapir posterior cranial fossa of the skull base with syntheitic graft of tachosil and bone wax.     2. Intraoperative use of microscope and microdissection.     [id number]        Surgeon Darol Destine MD [phone])     Assistant Surgeon  none           Anesthesia   General endotracheal tube anesthesia.        IDENTIFICATION:   Elizabeth Santana is a 77 y.o. female        PATIENT'S PROCEDURE:   The patient had a Left infratemporal post auricular approach including mastoidectomy with exposure of the external auditory canal with resection of posterior cranial fossa meningocele and scondary repair of dura for CSF leak reapir posterior cranial fossa of the skull base with syntheitic graft of tachosil and bone wax.     DRAINS:   None.     DISPOSITION:   Stable to the PACU.     INDICATIONS FOR PROCEDURE:   Elizabeth Santana is a 77 y.o. female   who was referred to our service for evaluation and management of a meningocele and intra operative CSF leak posterior fossa.        DIAGNOSTIC STUDIES:   The patient had an MRI of the brain which showed fluid in the mastoid and possible meningocele in the infratemporal approach of the posterior fossa meningocele.        Operative technique.     Neurosurgery was consulted for a meningocele during a infratemporal posterior auricular approach including mastoidectomy.     The meningocele and csf leak was identified under microscopic dissection and the circufrential revisoin and bone edges was adnetified.     This meningocele was receded using bipolar electrocautery to shrink the dura.     A tachosil graft was then used to repair the csf leak secondary skull base and tacked under the bone edges to keep it in place.     The bony defect was then repaired with bone wax in several coats until there was no further csf egress.     The remainder of the closure and reconstruction was performed by Dr [facility].        COMPLEXITY:   This was a highly complex operation that required careful infratemporal posterior auricular approach with mastoidectomy to resect a meningocele lesion. This required a careful infratemporal posterior auricual pre sigmoid craniotomy approach with dissection of this meningocele away from this critical region of the brain. Resection of the lesion required meticulous high-magnification separation of the lesion  away from the associated brain tissue. This was a complex operation requiring  high-magnification microdissection in order to rtreat this meningocle and perform the csf leak secondary skull base repair under microscopic dissection.      NOTE on COMPLEXITY:  Please note that this patient's procedure was met with increased complexity given the history of prior surgery. As such this patient's surgery required substantial increase in surgical time, with increased blood loss and mental effort. Overall the  complexity of this case increased the surgical time by 1 hour, totaling 4 hours of surgical time. In general, this case is expected to require only 3 hours.

## 2023-01-01 NOTE — H&P
UPDATED H&P REQUIREMENT    For Ardyth Harps [facility] and Norval Gable [facility] and [facility]    WHAT IS THE STATUS OF THE PATIENT'S MOST CURRENT HISTORY AND PHYSICAL?   - There is no recent H&P <30 days.  Proceed to H&P Notes section for new H&P.     REFER TO MEDICAL STAFF POLICIES REGARDING PRE-PROCEDURE HISTORY AND PHYSICAL EXAMINATION AND UPDATED H&P REQUIREMENTS BELOW:    [facility]dyth Harps [facility] and [facility] and [facility] Medical Staff Policy 200 - For Patients Undergoing Procedures Requiring Moderate or Deep Sedation, General Anesthesia or Regional Anesthesia    Contents of a History and Physical Examination (H&P):    The H&P shall consist of chief complaint, history of present illness, allergies and medications, relevant social and family history, past medical history, review of systems and physical examination, and assessment and plan appropriate to the patient's age.    For Patients Undergoing Procedures Requiring Moderate or Deep Sedation, General Anesthesia or Regional Anesthesia:    1. An H&P shall be performed within 24 hours prior to the procedure by a qualified member of the medical staff or designee with appropriate privileges, except as noted in item 2 below.    2. If a complete history and physical was performed within thirty (30) calendar days prior to the patient???s admission to the Medical Center for elective surgery, a member of the medical staff assumes the responsibility for the accuracy of the clinical information and will need to document in the medical record within twenty-four (24) hours of admission and prior to surgery or major invasive procedure, that they either attest that the history and physical has been reviewed and accepted, or document an update of the original history and physical relevant to the patient's current clinical status. 3. Providing an H&P for patients undergoing surgery under local anesthesia is at the discretion of the Attending Physician.     4. When a procedure is performed by a dentist, podiatrist or other practitioner who is not privileged to perform an H&P, the anesthesiologist???s assessment immediately prior to the procedure will constitute the 24 hour re-assessment.The dentist, podiatrist or other practitioner who is not privileged to perform an H&P will document the history and physical relevant to the procedure.    5. If the H&P and the written informed consent for the surgery or procedure are not recorded in the patient's medical record prior to surgery, the operation shall not be performed unless the attending physician states in writing that such a delay could lead to an adverse event or irreversible damage to the patient.    6. The above requirements shall not preclude the rendering of emergency medical or surgical care to a patient in dire circumstances.

## 2023-01-02 LAB — Glucose,POC
GLUCOSE,POC: 126 mg/dL — ABNORMAL HIGH (ref 65–99)
GLUCOSE,POC: 122 mg/dL — ABNORMAL HIGH (ref 65–99)
GLUCOSE,POC: 141 mg/dL — ABNORMAL HIGH (ref 65–99)
GLUCOSE,POC: 107 mg/dL — ABNORMAL HIGH (ref 65–99)
GLUCOSE,POC: 76 mg/dL (ref 65–99)

## 2023-01-02 LAB — Basic Metabolic Panel
ESTIMATED GFR 2021 CKD-EPI: >89 mL/min/{1.73_m2} (ref 20–30)
GLUCOSE: 103 mg/dL — ABNORMAL HIGH (ref 65–99)

## 2023-01-02 LAB — Magnesium: MAGNESIUM: 1.8 meq/L (ref 1.4–1.9)

## 2023-01-02 LAB — Phosphorus: PHOSPHORUS: 3.6 mg/dL (ref 2.3–4.4)

## 2023-01-02 LAB — CBC: MEAN CORPUSCULAR VOLUME: 94.4 fL (ref 79.3–98.6)

## 2023-01-02 MED ADMIN — OXYCODONE HCL 5 MG PO TABS: 15 mg | ORAL | @ 04:00:00 | Stop: 2023-01-04 | NDC 68084035411

## 2023-01-02 MED ADMIN — ACETAMINOPHEN 325 MG PO TABS: 650 mg | ORAL | @ 16:00:00 | Stop: 2023-01-04 | NDC 50580045811

## 2023-01-02 MED ADMIN — INSULIN ASPART 100 UNIT/ML SC SOPN: 3 mL | SUBCUTANEOUS | @ 05:00:00 | Stop: 2023-01-04

## 2023-01-02 MED ADMIN — APIXABAN 5 MG PO TABS: 5 mg | ORAL | @ 21:00:00 | Stop: 2023-01-04 | NDC 00003089431

## 2023-01-02 MED ADMIN — SODIUM CHLORIDE 0.9 % IV SOLN: @ 04:00:00 | Stop: 2023-01-02 | NDC 00338004948

## 2023-01-02 MED ADMIN — PANTOPRAZOLE SODIUM 40 MG PO TBEC: 40 mg | ORAL | @ 16:00:00 | Stop: 2023-01-04 | NDC 60687073609

## 2023-01-02 MED ADMIN — POTASSIUM CHLORIDE CRYS ER 20 MEQ PO TBCR: 40 meq | ORAL | @ 13:00:00 | Stop: 2023-01-03 | NDC 00245531989

## 2023-01-02 MED ADMIN — PROCHLORPERAZINE EDISYLATE 10 MG/2ML IJ SOLN: 5 mg | INTRAVENOUS | @ 01:00:00 | Stop: 2023-01-02 | NDC 25021079002

## 2023-01-02 MED ADMIN — INSULIN ASPART 100 UNIT/ML SC SOPN: 3 mL | SUBCUTANEOUS | @ 02:00:00 | Stop: 2023-01-04 | NDC 00169633910

## 2023-01-02 MED ADMIN — CEFAZOLIN SODIUM-DEXTROSE 1-4 GM/50ML-% IV SOLN: 1 g | INTRAVENOUS | @ 04:00:00 | Stop: 2023-01-02 | NDC 00338350341

## 2023-01-02 MED ADMIN — HYDROMORPHONE HCL 1 MG/ML IJ SOLN: .4 mg | INTRAVENOUS | Stop: 2023-01-02 | NDC 00409128331

## 2023-01-02 MED ADMIN — DOCUSATE SODIUM 100 MG PO CAPS: 100 mg | ORAL | @ 04:00:00 | Stop: 2023-01-04 | NDC 00904718361

## 2023-01-02 MED ADMIN — INSULIN ASPART 100 UNIT/ML SC SOPN: 3 mL | SUBCUTANEOUS | @ 05:00:00 | Stop: 2023-01-04 | NDC 00169633910

## 2023-01-02 MED ADMIN — DOCUSATE SODIUM 100 MG PO CAPS: 100 mg | ORAL | @ 16:00:00 | Stop: 2023-01-04 | NDC 00904718361

## 2023-01-02 NOTE — Progress Notes
HEAD & NECK SURGERY PROGRESS NOTE    PATIENT: Elizabeth Santana  MRN: [id number]  DOB: [birth year]  DATE OF SERVICE: 01/02/2023    ID: Chaylee Besancon is a 77 y.o. female with CSF leak [G96.00] now 1 Day Post-Op s/p Procedure(s) (LRB):  Repair of Encephalocele (Left)  MASTOIDECTOMY / MASTOID OBLIERATION / LEFT ENCEPHALOCELE REPAIR /ABDOMINAL FAT GRAFT (Left)     LOS: 1 day     Subjective:   Interval History:  POD 1: NAEON, AFVSS. K 3.1/Mg 1.8 on AM labs. Received pain and nausea PRN meds overnight. L-Posterior auricular incision clean, dry, intact without pressure dressing since last night. No weakness or vision changes.                       Objective:   Medications:  Scheduled Meds:   apixaban  5 mg Oral BID    docusate  100 mg Oral BID    DULoxetine  60 mg Oral Daily    ezetimibe  5 mg Oral Daily    furosemide  40 mg Oral BID    lisinopril  5 mg Oral Daily    mupirocin   Topical BID    pantoprazole  40 mg Oral Daily    simvastatin  40 mg Oral QHS     Continuous Infusions:  PRN Meds:acetaminophen, bisacodyl, calcium gluconate IVPB, insulin aspart **AND** dextrose, magnesium hydroxide, magnesium sulfate IV **OR** magnesium sulfate IV, metoprolol, ondansetron injection/IVPB, oxyCODONE **OR** oxyCODONE **OR** oxyCODONE, potassium chloride **OR** potassium chloride **OR** potassium chloride **OR** potassium chloride, potassium phosphate IV (peripheral) **OR** potassium phosphate IV (peripheral), prochlorperazine, senna    Lines/Drains/Airways:  Output by Drain (mL) 12/31/22 0701 - 12/31/22 1900 12/31/22 1901 - 01/01/23 0700 01/01/23 0701 - 01/01/23 1900 01/01/23 1901 - 01/02/23 0700 01/02/23 0701 - 01/02/23 1839   Patient has no LDAs of requested type attached.     No active LDA found  1  Peripheral IV 16 G Right Antecubital (1)        Physical Exam:  Vital Signs:  Temp:  [36 ?C (96.8 ?F)-36.6 ?C (97.9 ?F)] 36.3 ?C (97.3 ?F)  Heart Rate:  [48-77] 56  Resp:  [7-23] 15  BP: (103-122)/(38-59) 109/55  NBP Mean:  [59-76] 71  Arterial Line BP (mmHg): (109-154)/(39-67) 140/50  MAP:  [61 mmHg-98 mmHg] 80 mmHg  SpO2:  [90 %-100 %] 93 %    General: No acute distress  Neuro: Awake and alert, moving all extremities spontaneously. CN II-XII grossly intact.  HEENT: No facial deformities, posterior auricular incision c/d/i. Glasscock placed on 8/30 PM  HB 1/6 bilaterally. Moist mucous membranes, tongue midline.   CV: Regular rate and rhythm. No ectopy on monitor.  Chest: Normal respiratory excursions, not tachypneic, non-labored breathing.  GI: Abdomen non-distended, op-site removed 8/30 with c/d/I incision beneath  Extremities: No peripheral edema noted. 5/5 strength in BLE/BUE.     Detailed I&O:  I/O last 2 completed shifts:  In: 2690 [P.O.:1340; I.V.:1300; IV Piggyback:50]  Out: 2580 [Urine:2550; Blood:30]    Intake/Output Summary (Last 24 hours) at 01/02/2023 1839  Last data filed at 01/02/2023 1711  Gross per 24 hour   Intake 1590 ml   Output 2600 ml   Net -1010 ml         Lab Review:    WBC/Hgb/Hct/Plts:  9.47/12.0/35.6/204 (08/30 0404)   Na/K/Cl/CO2/BUN/Cr/glu:  144/3.1/109/26/11/0.61/103 (08/30 0404)           No results found for this or any previous visit (  from the past 168 hour(s)).    Pathology:  No results found for: ''LABAPFINALDIAGNOSIS''      Imaging:  No imaging has been resulted in the last 24 hours    Assessment and Plan:     Joydan Girty is a 77 y.o. 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     Patient recovering as expected post-operatively. Patient transferred to [facility] primary later in day. Removed opsite from abdomen, wound healing appropriately. Glasscock dressing applied to L ear.      1 Day Post-Op    Neuro: #postop pain  Pain controlled with below PRNs  acetaminophen, 650 mg, Oral, Q6H PRN  oxyCODONE, 5 mg, Oral, Q4H PRN   Or  oxyCODONE, 10 mg, Oral, Q4H PRN   Or  oxyCODONE, 15 mg, Oral, Q4H PRN        HEENT:   #s/p L-mastoidectomy   #s/p L-abdominal fat graft  Healing appropriately, no signs of deficit or complication      CV:   #PMH L-MCA Stroke, Lupus, APLS  Hemodynamically stable  Metoprolol PRN   ezetimibe, 5 mg, Oral, Daily  lisinopril, 5 mg, Oral, Daily  metoprolol, 5 mg, IV Push, Q6H PRN  simvastatin, 40 mg, Oral, QHS           GI:  Diet carbohydrate controlled  Nausea PRNs and bowel regimen as below    bisacodyl, 5 mg, Oral, Daily PRN  docusate, 100 mg, Oral, BID  magnesium hydroxide, 30 mL, Oral, Daily PRN  ondansetron injection/IVPB, 4 mg, Intravenous, Q8H PRN  pantoprazole, 40 mg, Oral, Daily  senna, 1 tablet, Oral, QHS PRN        GU: Voiding independently       Endo: Euglycemic  #S/p resection of Pituitary microadenoma  #Secondary electrolyte imbalances  Repleted by primary team this AM  ISS    insulin aspart, , Subcutaneous, QAC & QHS PRN        Hem/ID: No active issues  Continue home Eliquis (restarted today) for chronic medical issues  WBC/Hgb/Hct/Plts:  9.47/12.0/35.6/204 (08/30 0404)   apixaban, 5 mg, Oral, BID           Bundle:  - Bowel reg: Standard  - Nausea ppx: None   - GI ppx: None  - Dispo: TBD    Patient was seen and examined with the Head and Neck team and the plan discussed with the Head and Neck Attending, Ralph Dowdy., MD, who agrees with the assessment/plan.      Please page [phone] with any questions.   Beecher Mcardle, MS-4    This note was authored by medical student, Beecher Mcardle, at [facility]. This note should not be consulted for clinical, billing, or medico-legal purposes unless a physician has reviewed this note and added an attestation below regarding the accuracy of above documentation.    Attestation  I was physically present with the medical student and I performed all key elements of history, exam, and medical decision making in the care of Alexiana Cornell and I agree with plan above. I have verified the documentation above as accurate with my edits and additions.     Verda Cumins MD 01/02/2023  [place] Department of Head and Neck Surgery      Author:  Verda Cumins MD 01/02/2023 6:39 PM  [place] Department of Head and Neck Surgery

## 2023-01-02 NOTE — Progress Notes
Neurocritical Care Team Evening Progress Note    The patient was seen in the ICU and critical care is being delivered throughout the day by the 24 hour-in-house intensive care practitioner. The patient is critically ill and requires ICU level care.  Please see NCC Attending Note from earlier today for overall plan of care.     Evening rounds were conducted with the bedside nurse and charge nurse.  Interval events of the day were discussed.  Updated electronic orders were provided throughout the 24 hour period.      BP 103/53  ~ Pulse 57  ~ Resp 16  ~ SpO2 98%   I/O this shift:  In: 660 [P.O.:610; IV Piggyback:50]  Out: 1115 [Urine:1115]    Recent Labs:  Lab Results   Component Value Date    WBC 6.8 12/03/2022    HGB 14.9 12/03/2022    HCT 45.1 12/03/2022    PLT 263 12/03/2022    NA 143 12/03/2022    K 3.7 12/03/2022    CL 102 12/03/2022    BUN 15 12/03/2022    CO2 30 12/03/2022    INR 1.1 12/09/2022     Lab Results   Component Value Date    GLUCOSEPOC 141 (H) 01/01/2023    GLUCOSEPOC 122 (H) 01/01/2023       Critical Care Provided today:    [x] Review new lab and imaging results  [x] Evaluate hemodynamics for instability  [x] Coordinate care with consultants and teams  [x] Supervise plan for care with bedside nurse  [x] Review and/or revise orders  [] Discuss care / issues with family    Interval important events and updates to the Critical Care Plan for Today:    Patient seen and examined after OR  Comfortable, alert in bed  Slight R weakness and R droop at baseline per patient  Plan for downgrade to ENT in AM    Geraldine Contras, IV  1:52 AM 01/02/2023  Neurocritical Care Fellow  Department of Neurological Surgery   Please page [phone] with questions

## 2023-01-02 NOTE — Progress Notes
Neurosurgery Progress Note   LOS: 1 day 1 Day Post-Op  Neurosurgery Attending: Threasa Beards   77 y.o.female hx TNTS 2010 cb L MCA stroke, lupus, APLS (on eliquis), HTN, HLD disequilibrium, anxiety, depression, hypokalemia, preDM now sp L mastoidectomy and CSF leak repair w L abdominal fat graft.     8/30: downgrade to ENT      Exam  Temp:  [36 ?C (96.8 ?F)-37.1 ?C (98.8 ?F)] 36.2 ?C (97.2 ?F)  Heart Rate:  [54-77] 55  Resp:  [7-25] 13  BP: (103-135)/(38-85) 108/51  NBP Mean:  [59-96] 69  Arterial Line BP (mmHg): (109-164)/(39-98) 122/50  MAP:  [61 mmHg-107 mmHg] 74 mmHg  SpO2:  [90 %-100 %] 100 %    Neurologic Exam:  A&Ox3, EOMI, FS, TM  Fcx4, BLE 5/5, BLE 5/5, No drift  SILT  Incision CDI    Output by Drain (mL) 12/31/22 0701 - 01/01/23 0700 01/01/23 0701 - 01/02/23 0435   Urethral Catheter Standard;Latex 16 Fr.  1960           Labs / Imaging  No results for input(s): ''NA'', ''WBC'', ''HGB'', ''PLT'', ''INR'', ''HEPANTIXA'', ''SRWEST'', ''ACTH'', ''PHENYTOIN'', ''PROCAL'', ''CRP'', ''PROLACTIN'', ''CORTISOL'' in the last 72 hours.  Medications:  Steroids:     Anti-infectives:     Anti-epileptics & pain medications:  acetaminophen, 650 mg, Oral, Q6H PRN  oxyCODONE, 5 mg, Oral, Q4H PRN   Or  oxyCODONE, 10 mg, Oral, Q4H PRN   Or  oxyCODONE, 15 mg, Oral, Q4H PRN      Heme:       Imaging:           Assessment  Yang  Neuro ICU    Plan:  Downgrade to H&N whn stable  Absorbable suture.     Please page [phone] w/ questions

## 2023-01-03 ENCOUNTER — Ambulatory Visit: Payer: PRIVATE HEALTH INSURANCE

## 2023-01-03 LAB — Magnesium: MAGNESIUM: 2.1 meq/L — ABNORMAL HIGH (ref 1.4–1.9)

## 2023-01-03 LAB — Basic Metabolic Panel
ANION GAP: 11 mmol/L (ref 8–19)
TOTAL CO2: 25 mmol/L (ref 20–30)

## 2023-01-03 LAB — Glucose,POC: GLUCOSE,POC: 133 mg/dL — ABNORMAL HIGH (ref 65–99)

## 2023-01-03 LAB — Phosphorus: PHOSPHORUS: 2.7 mg/dL (ref 2.3–4.4)

## 2023-01-03 LAB — CBC: MEAN CORPUSCULAR HEMOGLOBIN: 31.9 pg (ref 26.4–33.4)

## 2023-01-03 MED ORDER — CEPHALEXIN 500 MG PO CAPS
500 mg | ORAL_CAPSULE | Freq: Three times a day (TID) | ORAL | 0 refills | 7.00000 days | Status: AC
Start: 2023-01-03 — End: ?
  Filled 2023-01-05 (×2): qty 21, 7d supply, fill #0

## 2023-01-03 MED ORDER — ACETAMINOPHEN 325 MG PO TABS
650 mg | ORAL_TABLET | Freq: Four times a day (QID) | ORAL | 0 refills | 6.00000 days | Status: AC | PRN
Start: 2023-01-03 — End: ?
  Filled 2023-01-05 (×2): qty 45, 6d supply, fill #0

## 2023-01-03 MED ADMIN — LISINOPRIL 2.5 MG PO TABS: 5 mg | ORAL | @ 15:00:00 | Stop: 2023-01-04 | NDC 60687065611

## 2023-01-03 MED ADMIN — EZETIMIBE 10 MG PO TABS: 5 mg | ORAL | @ 18:00:00 | Stop: 2023-01-04 | NDC 60687037321

## 2023-01-03 MED ADMIN — DULOXETINE HCL 30 MG PO CPEP: 60 mg | ORAL | @ 15:00:00 | Stop: 2023-02-01 | NDC 00904704461

## 2023-01-03 MED ADMIN — DULOXETINE HCL 30 MG PO CPEP: 60 mg | ORAL | @ 01:00:00 | Stop: 2023-01-04 | NDC 00904704461

## 2023-01-03 MED ADMIN — MUPIROCIN 2 % EX OINT: TOPICAL | @ 15:00:00 | Stop: 2023-01-04 | NDC 68462018022

## 2023-01-03 MED ADMIN — FUROSEMIDE 40 MG PO TABS: 40 mg | ORAL | @ 04:00:00 | Stop: 2023-01-04 | NDC 00054829925

## 2023-01-03 MED ADMIN — PANTOPRAZOLE SODIUM 40 MG PO TBEC: 40 mg | ORAL | @ 15:00:00 | Stop: 2023-01-04 | NDC 60687073609

## 2023-01-03 MED ADMIN — APIXABAN 5 MG PO TABS: 5 mg | ORAL | @ 04:00:00 | Stop: 2023-01-04 | NDC 00003089431

## 2023-01-03 MED ADMIN — SIMVASTATIN 20 MG PO TABS: 40 mg | ORAL | @ 04:00:00 | Stop: 2023-01-04 | NDC 63739057210

## 2023-01-03 MED ADMIN — LISINOPRIL 2.5 MG PO TABS: 5 mg | ORAL | @ 01:00:00 | Stop: 2023-01-04 | NDC 60687065611

## 2023-01-03 MED ADMIN — DOCUSATE SODIUM 100 MG PO CAPS: 100 mg | ORAL | @ 15:00:00 | Stop: 2023-01-04 | NDC 00904718361

## 2023-01-03 MED ADMIN — ACETAMINOPHEN 325 MG PO TABS: 650 mg | ORAL | @ 02:00:00 | Stop: 2023-01-04 | NDC 50580045811

## 2023-01-03 MED ADMIN — APIXABAN 5 MG PO TABS: 5 mg | ORAL | @ 15:00:00 | Stop: 2023-01-04 | NDC 00003089431

## 2023-01-03 MED ADMIN — OXYCODONE HCL 5 MG PO TABS: 15 mg | ORAL | @ 15:00:00 | Stop: 2023-01-04 | NDC 68084035411

## 2023-01-03 MED ADMIN — OXYCODONE HCL 5 MG PO TABS: 15 mg | ORAL | @ 19:00:00 | Stop: 2023-01-04 | NDC 68084035411

## 2023-01-03 MED ADMIN — MUPIROCIN 2 % EX OINT: TOPICAL | @ 06:00:00 | Stop: 2023-01-04 | NDC 68462018022

## 2023-01-03 MED ADMIN — POTASSIUM CHLORIDE CRYS ER 20 MEQ PO TBCR: 60 meq | ORAL | @ 15:00:00 | Stop: 2023-01-03 | NDC 00245531989

## 2023-01-03 MED ADMIN — EZETIMIBE 10 MG PO TABS: 5 mg | ORAL | @ 01:00:00 | Stop: 2023-01-04 | NDC 60687037321

## 2023-01-03 MED ADMIN — OXYCODONE HCL 5 MG PO TABS: 15 mg | ORAL | @ 04:00:00 | Stop: 2023-01-04 | NDC 68084035411

## 2023-01-03 MED ADMIN — INSULIN ASPART 100 UNIT/ML SC SOPN: 3 mL | SUBCUTANEOUS | @ 07:00:00 | Stop: 2023-01-04 | NDC 00169633910

## 2023-01-03 MED ADMIN — FUROSEMIDE 40 MG PO TABS: 40 mg | ORAL | @ 15:00:00 | Stop: 2023-01-04 | NDC 00054829925

## 2023-01-03 MED ADMIN — DOCUSATE SODIUM 100 MG PO CAPS: 100 mg | ORAL | @ 04:00:00 | Stop: 2023-01-04 | NDC 00904718361

## 2023-01-03 NOTE — Nursing Note
@  1820:Received patient from PACU-B via bed with escort and RN. Bedside report received from Ruby, RN (PACU-B).

## 2023-01-03 NOTE — Discharge Instructions
Post-Operative Instructions for Ear Surgery   Head and Neck   DISCHARGE INSTRUCTIONS      Please review the following discharge instructions. They will answer many common questions people have after surgery.  Bloody or watery drainage from incision area may occur during the first ten days following surgery and this is normal. If this happens after 10 days, tape a piece of gauze behind the ear and call our office.  A full sensation with popping sounds may be noticed during the healing period. This is also normal. If you had surgery to improve your hearing, do not be concerned regarding your hearing for a period of six to eight weeks following surgery. Your hearing will be evaluated at that time.   ACTIVITY:  Resume regular activity gradually as tolerated.   WALKING: You may walk as much as you can. Walking will improve circulation, increase your feeling of well-being, and prevent pulmonary problems.  LIFTING: No heavy lifting >5 lbs. For 2 weeks.  DRIVING:  When you return for your follow-up visit, ask your doctor when you can resume driving. NO DRIVING WHILE TAKING PAIN MEDICATIONS.  RESTRICTIONS: DO NOT BLOW YOUR NOSE FOR TWO WEEKS following surgery. Any accumulated secretion in the nose should be expectorated through the mouth to avoid infecting the ear. If you sneeze, do so with your mouth open during the sneezing seizure. Do not forcefully blow your nose or manipulate the surgical area until you are seen at your follow-up appointment.    BATHING/ INCISIONAL CARE:  Keep surgical incision clean and dry. You may shower without covering the incision starting 3 days after surgery - avoid direct water pressure or scrubbing over the incision. You must keep water out of the ear canal - place a cotton ball rolled in Vaseline in your ear canal to block out water when you shower. No tub baths, swimming or immersion in water until cleared by your surgeon.     - CSF leak precautions:     -- No nose blowing    -- Avoid bending over/straining    -- Sneeze with open mouth     -- Take Miralax daily to avoid straining       MEDICATIONS:   Take the prescribed antibiotic as directed to prevent infection - cephalexin  Take your prescription pain medication (Hydrocodone-Acetaminophen) as directed. Use stool softeners as needed to prevent constipation, particularly while taking narcotic pain medications - any over the counter brand is fine (we recommend Miralax, or Senna).   Resume taking your previous medications unless otherwise directed. Drink ample fluid.   Do not resume taking aspirin or ibuprofen for 7 days after your surgery or unless otherwise directed.    DIET: REGULAR    DANGER SIGNALS TO WATCH FOR AT HOME  Contact us if you notice any of the following:  ? Discharge from the ear persists longer than 14 days or develops an odor  ? Chills, fever exceeding 101 ?F (38.5 ?C)  ? Sudden onset headache or severe worsening of headache  ? Sudden pain with light  ? Nausea or vomiting persisting for 24 hours  ? Redness, significant swelling, or foul-smelling drainage from the surgical site  ? Discomfort not relieved by prescribed medications  ? Sudden, persistent increase in pain  ? Inability to pass urine  ? Constipation or excessive diarrhea which persists beyond three days    HOW TO REACH [place]  We are available to you at all times. For urgent questions, you can call  the hospital page operator (number is on your discharge paperwork) and ask for the Head & Neck resident on call.   ? Please follow-up with Dr. Baird Cancer over video appointment on 01/14/23. Please call [phone] for questions about your appointment.  ? In case of emergency, report to your closest emergency room or call 911    If you have an emergency once back in [facility], please go to the Emergency Department at [facility]). If you have any other issues with your surgery that need in person follow up, please see Jackey Loge MD at [facility].    ADDITIONAL INSTRUCTIONS    It is not uncommon to tire easily and/or feel exhausted the day following surgery. Do not be alarmed, as this is normal. Plan activities that will allow you to ''pace'' yourself and/or rest as needed.   For your protection and safety, we strongly advise a responsible adult remain with you for the rest of the surgical day and night. Also, you should not be responsible for the care of others (children/adults) for 24 hours post sedation and a responsible adult should be present to assume those responsibilities.   You should take several deep breaths and give several forceful coughs every hour during the initial post-operative day. This will help to clear mucus, and keep the lungs appropriately inflated.   If you need advice or have any questions regarding ANESTHESIA, please call the appropriate number and ask to speak to an available anesthesiologist:   ([phone] WEEKDAYS until 5:00 PM   ([phone]) [phone] if AFTER HOURS, or WEEKENDS/HOLIDAYS   (This is the page operator: ask to have the faculty anesthesiologist paged)

## 2023-01-03 NOTE — Progress Notes
HEAD & NECK SURGERY PROGRESS NOTE    PATIENT: Elizabeth Santana  MRN: [id number]  DOB: [birth year]  DATE OF SERVICE: 01/03/2023    ID: Elizabeth Santana is a 77 y.o. female with CSF leak [G96.00] now 2 Days Post-Op s/p Procedure(s) (LRB):  Repair of Encephalocele (Left)  MASTOIDECTOMY / MASTOID OBLIERATION / LEFT ENCEPHALOCELE REPAIR /ABDOMINAL FAT GRAFT (Left)     LOS: 2 days     Subjective:   Interval History:  POD 1: NAEON, AFVSS. K 3.1/Mg 1.8 on AM labs. Received pain and nausea PRN meds overnight. L-Posterior auricular incision clean, dry, intact without pressure dressing since last night. No weakness or vision changes.   POD 2: NAEON, AFVSS. Patient notes headache and persistent rhinorrhea with salty/metallic taste that has been ongoing since prior to her operation.                      Objective:   Medications:  Scheduled Meds:   apixaban  5 mg Oral BID    docusate  100 mg Oral BID    DULoxetine  60 mg Oral Daily    ezetimibe  5 mg Oral Daily    furosemide  40 mg Oral BID    lisinopril  5 mg Oral Daily    mupirocin   Topical BID    pantoprazole  40 mg Oral Daily    simvastatin  40 mg Oral QHS     Continuous Infusions:  PRN Meds:acetaminophen, bisacodyl, calcium gluconate IVPB, insulin aspart **AND** dextrose, magnesium hydroxide, magnesium sulfate IV **OR** magnesium sulfate IV, metoprolol, ondansetron injection/IVPB, oxyCODONE **OR** oxyCODONE **OR** oxyCODONE, potassium chloride **OR** potassium chloride **OR** potassium chloride **OR** potassium chloride, potassium phosphate IV (peripheral) **OR** potassium phosphate IV (peripheral), prochlorperazine, senna    Lines/Drains/Airways:  Output by Drain (mL) 01/01/23 0701 - 01/01/23 1900 01/01/23 1901 - 01/02/23 0700 01/02/23 0701 - 01/02/23 1900 01/02/23 1901 - 01/03/23 0546   Patient has no LDAs of requested type attached.     No active LDA found  1  Peripheral IV 16 G Right Antecubital (2)        Physical Exam:  Vital Signs:  Temp:  [36.2 ?C (97.2 ?F)-36.7 ?C (98.1 ?F)] 36.4 ?C (97.5 ?F)  Heart Rate:  [54-63] 62  Resp:  [13-23] 14  BP: (109-129)/(44-55) 112/49  NBP Mean:  [64-76] 67  Arterial Line BP (mmHg): (121-140)/(50-56) 140/50  MAP:  [76 mmHg-86 mmHg] 80 mmHg  SpO2:  [93 %-98 %] 93 %    General: No acute distress  Neuro: Awake and alert, moving all extremities spontaneously. CN II-XII grossly intact.  HEENT: No facial deformities, posterior auricular incision c/d/i. Glasscock placed on 8/30 PM with minimal serosanguinous output on gauze.  HB 1/6 bilaterally. Moist mucous membranes, tongue midline.   CV: Regular rate and rhythm. No ectopy on monitor.  Chest: Normal respiratory excursions, not tachypneic, non-labored breathing.  GI: Abdomen non-distended, op-site removed 8/30 with mild erythema c/d/i incision beneath with mild erythema   Extremities: Peripheral edema noted of the RIGHT>left leg. +Homans sign. 4/5 strength bilaterally in BLE/BUE.     Detailed I&O:  I/O last 2 completed shifts:  In: 1710 [P.O.:1660; IV Piggyback:50]  Out: 2601 [Urine:2601]    Intake/Output Summary (Last 24 hours) at 01/03/2023 0546  Last data filed at 01/02/2023 1918  Gross per 24 hour   Intake 1020 ml   Output 1286 ml   Net -266 ml         Lab Review:  No results found for this or any previous visit (from the past 168 hour(s)).    Pathology:  No results found for: ''LABAPFINALDIAGNOSIS''      Imaging:  No imaging has been resulted in the last 24 hours    Assessment and Plan:     Elizabeth Santana is a 77 y.o. 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 Patient recovering as expected post-operatively.    BLE [place] this morning ordered which did not find any evidence of DVT. Encouraged compression socks and ambulation.     L ear Glasscock dressing without saturation but patient now letting care team know she is experiencing persistent salty/metallic taste and dripping from her nose that has remained persistent since PRIOR to her operation with accompanied headache but no fevers. Tilt test completed for 5 minutes, no fluid was seen. Left collection contained for patient in case of drainage overnight but low concern for CSF leak. Plan to continue to monitor while inpatient,  discuss with neurosurgery team, and order beta2 transferring test for CSF leak rule out.      2 Days Post-Op    Neuro:   #postop pain  Pain controlled with below PRNs  acetaminophen, 650 mg, Oral, Q6H PRN  oxyCODONE, 5 mg, Oral, Q4H PRN   Or  oxyCODONE, 10 mg, Oral, Q4H PRN   Or  oxyCODONE, 15 mg, Oral, Q4H PRN        HEENT:   #s/p L-mastoidectomy   #s/p L-abdominal fat graft  Healing appropriately, no signs of deficit or complication  Mild erythema of the abdominal incision without induration. Will continue to monitor closely.       CV:   #PMH L-MCA Stroke, Lupus, APLS  Hemodynamically stable  Continue home meds  Metoprolol PRN   ezetimibe, 5 mg, Oral, Daily  lisinopril, 5 mg, Oral, Daily  metoprolol, 5 mg, IV Push, Q6H PRN  simvastatin, 40 mg, Oral, QHS           GI:  Diet carbohydrate controlled  Nausea PRNs and bowel regimen as below    bisacodyl, 5 mg, Oral, Daily PRN  docusate, 100 mg, Oral, BID  magnesium hydroxide, 30 mL, Oral, Daily PRN  ondansetron injection/IVPB, 4 mg, Intravenous, Q8H PRN  pantoprazole, 40 mg, Oral, Daily  senna, 1 tablet, Oral, QHS PRN      GU: Voiding independently       Endo: Euglycemic  #S/p resection of Pituitary microadenoma  #Secondary electrolyte imbalances  Repleted potassium this AM, patient on lasix at home  ISS  insulin aspart, , Subcutaneous, QAC & QHS PRN        Hem/ID: No active issues  Continue home Eliquis for chronic medical conditions  DVT US returned negative apixaban, 5 mg, Oral, BID           Bundle:  - Bowel reg: Standard  - Nausea ppx: None   - GI ppx: None  - Dispo: TBD    Patient was seen and examined with the Head and Neck team and the plan discussed with the Head and Neck Attending, Ralph Dowdy., MD, who agrees with the assessment/plan.      Please page [phone] with any questions.   Morrell Riddle MS-4    Attestation  I was physically present with the medical student and I performed all key elements of history, exam, and medical decision making in the care of Elizabeth Santana and I agree with plan above. I have verified the documentation above as accurate with my edits and additions.     Author: Verda Cumins MD 01/03/2023 5:46 AM  [place] Department of Head and Neck Surgery

## 2023-01-03 NOTE — Progress Notes
NUTRITION IN-DEPTH SCREEN (Adult)    Admit Date: 01/01/2023     Date of Birth: 1945-12-07 Gender: female MRN: [id number]     Date of Screening 01/03/2023   Subjective: Chart reviewed, visited pt.Husband present in room. Reports eating well with good PO intake > 75% of most meals.Denies n/v/d/c.LBM  8/30.   Problems: Active Problems:    * No active hospital problems. *       Past Medical History:   Diagnosis Date    Anxiety     Balance disorder     Brain tumor (benign) (HCC/RAF)     Chronic pain     CSF leak     Depression     GERD (gastroesophageal reflux disease)     Headache     Hearing loss     Hyperlipidemia     Hypertension     Lupus (HCC/RAF)     Post-operative nausea and vomiting     Stroke (HCC/RAF)     Vision abnormalities     Past Surgical History:   Procedure Laterality Date    HYSTERECTOMY  1998    PITUITARY SURGERY  2010    WRIST FRACTURE SURGERY Left 2021         Anthropometrics     Height: 162.6 cm (5' 4.02'')  Admit Weight: 85 kg (187 lb 6.3 oz) (01/01/23 0622)  Current Weight: 85 kg (187 lb 6.3 oz)  BMI: 32.15  IBW: 54 kg/120 lbs  IBW %: 156  Adj BW (if applicable): 62 kg  UBW: 190 kg (418 lb 14 oz)  UBW %: 45 %       Weight History (last 5)  Weights Weight   10/06/2022   2:04 PM 81.6 kg   01/01/2023   6:22 AM 85 kg        Allergies   Gabapentin, Losartan, Atorvastatin, Duloxetine hcl, Ezetimibe, Lisinopril, Other, Peanut oil, Shrimp, Shrimp flavor, Sulfa antibiotics, Lovastatin, Pravastatin, and Rosuvastatin     Cultural / Religious / Ethnic Food Preferences   None       Nutrition Prior to Admission   Regular diet     Nutrition Risk Factors   Moderate Nutrition Risk Factors: Pre-diabetes, Geriatric surgery patient (>28 y.o.)  Acuity Level: 2-Moderate risk        Diet Orders     Diets/Supplements/Feeds   Diet    Diet carbohydrate controlled     Start Date/Time: 01/01/23 1230      Number of Occurrences: Until Specified        Impression   PO % consumed: 76 to 100%  Impression: Diet tolerated well with good intake     Diet Education   No diet education needs at this time                 Nutrition Care Plan   Plan: Continue with diet as ordered, Trend weights, Monitor adequacy of intake, Monitor tolerance to diet         Will follow per policy    Author:  Isidor Holts, DTR, pager [phone]  01/03/2023 1:28 PM

## 2023-01-03 NOTE — Other
Patient's Clinical Goal:   Clinical Goal(s) for the Shift: 1. Hemodynamically stable 2. Kept pt safe and comfortable  Identify possible barriers to advancing the care plan: none  Stability of the patient: Stable - low risk of patient condition declining or worsening   Progression of Patient's Clinical Goal:     1.Hemodynamically stable, neuro intact except to pts hearing impairment which is baseline prior surgery although pt stated that its improving, room air.  2. Kept pt safe and comfortable by assisting w/ ADL's, administer pain med as ordered. Pt refused narcotic early on today so pt received Tylenol po and ice pack sec to left ear pain.    Additional notes:    - report was given to 6N, verbalized understanding. Noted pt has no missing belongings prior transfer    - tolerated diet and ambulation well    - dressing to pts left ear and clear cup to cover was applied by Head and Neck this evening prior pts transfer. Rn was instructed that the dressing is NOT TO BE TOUCHED UNLESS SATURATED, instructions to be relayed to receiving unit

## 2023-01-03 NOTE — Discharge Summary
INPATIENT DISCHARGE SUMMARY    PATIENTNaticia Santana  MRN:  [id number]  DOB:  Jul 30, 1945    Attending Physician: Ralph Dowdy., MD  Chief Resident: Hart Rochester, MD  Intern Resident: Shann Medal, MD     Admission Date:  01/01/2023  Discharge Date: 01/04/2023     Admission Diagnosis:    CSF leak [G96.00]    Discharge Diagnosis:   CSF leak [G96.00]    Surgery Performed:   Procedure(s) (LRB):  Repair of Encephalocele (Left)  MASTOIDECTOMY / MASTOID OBLIERATION / LEFT ENCEPHALOCELE REPAIR /ABDOMINAL FAT GRAFT (Left)    Complications: None    Consultations: None    Chronic Conditions:  Past Medical History:   Diagnosis Date    Anxiety     Balance disorder     Brain tumor (benign) (HCC/RAF)     Chronic pain     CSF leak     Depression     GERD (gastroesophageal reflux disease)     Headache     Hearing loss     Hyperlipidemia     Hypertension     Lupus (HCC/RAF)     Post-operative nausea and vomiting     Stroke (HCC/RAF)     Vision abnormalities        HPI (from note by Dr. Threasa Beards on ):   Elizabeth Santana is a 77 y.o. 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 The patient presents today with MRI demonstrating recurrence of her tumor.      The patient endorses some disequilibrium/balance issues. She states that fluid started coming out of her left ear over 6 months ago after she moved from the [facility]. She had beta 2 transferrin sent, which was positive. She saw ENT that placed a patch to stop the leaking. It worked partially and starteed coming out the nose. Endorses chills but no fevers. Some suboccipital headaches/shoulder pain. Some retroorbital pain.    Brief Hospital Course:     On 01/01/2023, the patient was taken to the operating room at [facility] and underwent above mentioned head & neck surgery. Please refer to operation report for details.     POD 1: NAEON, AFVSS. K 3.1/Mg 1.8 on AM labs. Received pain and nausea PRN meds overnight. L-Posterior auricular incision clean, dry, intact without pressure dressing since last night. No weakness or vision changes.   POD 2: NAEON, AFVSS. Patient notes headache and persistent rhinorrhea with salty/metallic taste that has been ongoing since prior to her operation.  POD 3: NAEON, AFVSS. Patient reports having a headache overnight that resolved with pain medication, no evidence of post nasal drainage or other neurologic changes. She has not noticed any external physical nasal drainage.    On the day of discharge, she was tolerating Diet carbohydrate controlled without nausea/vomiting, ambulating freely, and voiding without difficulty. Pain was well controlled with oral medication. She was given instructions to go to the Emergency Room of the nearest large hospital as well as to call the [facility] on call Head and Neck resident if there is persistent bleeding. She was deemed stable for discharge.    Physical Exam:  BP 129/60  ~ Pulse 67  ~ Temp 37 ?C (98.6 ?F) (Oral)  ~ Resp 13  ~ Ht 1.626 m (5' 4'')  ~ Wt 84.9 kg (187  lb 2.7 oz)  ~ SpO2 96%  ~ BMI 32.13 kg/m?   General: No acute distress  Neuro: Awake and alert, moving all extremities spontaneously. CN II-XII grossly intact.  HEENT: No facial deformities, posterior auricular incision c/d/I, post-surgical swelling resolved. Glasscock placed on 8/30 PM, removed today given internal resolution of swelling. No evidence of anterior or posterior nasal drainage.  HB 1/6 bilaterally. Moist mucous membranes, tongue midline.   CV: Regular rate and rhythm. No ectopy on monitor.  Chest: Normal respiratory excursions, not tachypneic, non-labored breathing.  GI: Abdomen non-distended, op-site removed 8/30 with mild erythema. c/d/i incision beneath with mild erythema   Extremities: Mild BLE peripheral edema, improved from yesterday s/p compression stockings. 5/5 strength bilaterally in BLE/BUE.     Imaging:  US duplex lower extremity veins bilat   Final Result by Frances Maywood, MD (08/31 4782)   IMPRESSION:        No evidence of deep venous thrombosis of the bilateral lower extremity veins.            Signed by: Frances Maywood   01/03/2023 9:53 AM          Discharge Labs:  Lab Results   Component Value Date    WBC 6.40 01/04/2023    HGB 12.3 01/04/2023    HCT 36.8 01/04/2023    MCV 96.6 01/04/2023    PLT 191 01/04/2023     Lab Results   Component Value Date    BUN 13 01/04/2023    NA 142 01/04/2023    K 3.1 (L) 01/04/2023    CL 105 01/04/2023    CO2 27 01/04/2023     Lab Results   Component Value Date    CALCIUM 8.3 (L) 01/04/2023    PHOS 3.0 01/04/2023       Discharge Medications:      Medication List        START taking these medications      acetaminophen 325 mg tablet  Commonly known as: Tylenol  Take 2 tablets (650 mg total) by mouth every six (6) hours as needed.     cephalexin 500 mg capsule  Commonly known as: Keflex  Take 1 capsule (500 mg total) by mouth three (3) times daily for 7 days.            CONTINUE taking these medications      ALPRAZolam 0.5 mg tablet  Commonly known as: Xanax     DULoxetine 60 mg DR capsule  Commonly known as: Cymbalta     ELIQUIS 5 mg tablet  Generic drug: apixaban     ezetimibe 10 mg tablet  Commonly known as: Zetia     furosemide 40 mg tablet  Commonly known as: Lasix     lisinopril 5 mg tablet  Commonly known as: Prinivil,Zestril     omeprazole 20 mg DR capsule  Commonly known as: PriLOSEC     potassium chloride 20 mEq ER tablet     simvastatin 40 mg tablet  Commonly known as: Zocor               Where to Get Your Medications        These medications were sent to [facility] [id number])  [address], [place] [address]      Hours: Mon-Fri 8AM-9PM, Sat 8AM-7PM, Sun/Holidays 8AM-5PM (Closed 1PM-2PM for lunch) Phone: [phone]   acetaminophen 325 mg tablet  cephalexin 500 mg capsule  Disposition: Home     Discharge Condition: stable    Diet: regular diet      Activity: as tolerated, no heavy lifting > 10 lbs.     Return Precautions: as listed on After Visit Summary (AVS).    Post-Discharge Appointments:   The patient was asked to follow up with Dr. Ralph Dowdy., MD in 2 weeks at [facility] [phone]).    Future Appointments   Date Time Provider Department Center   01/14/2023  4:15 PM Ralph Dowdy., MD [facility]         The patient was seen and examined by Head & Neck Surgery team this morning.  Above plan was discussed with chief resident, Hart Rochester MD, and attending physician, Ralph Dowdy., MD, who were in agreement with the plan.    Author: Shann Medal, MD 01/04/2023 9:03 AM

## 2023-01-04 LAB — CBC: WHITE BLOOD CELL COUNT: 6.4 10*3/uL (ref 4.16–9.95)

## 2023-01-04 LAB — Basic Metabolic Panel
GLUCOSE: 118 mg/dL — ABNORMAL HIGH (ref 65–99)
SODIUM: 142 mmol/L (ref 135–146)

## 2023-01-04 LAB — Phosphorus: PHOSPHORUS: 3 mg/dL (ref 2.3–4.4)

## 2023-01-04 LAB — Glucose,POC
GLUCOSE,POC: 108 mg/dL — ABNORMAL HIGH (ref 65–99)
GLUCOSE,POC: 97 mg/dL (ref 65–99)

## 2023-01-04 LAB — Magnesium: MAGNESIUM: 1.6 meq/L (ref 1.4–1.9)

## 2023-01-04 MED ORDER — POLYETHYLENE GLYCOL 3350 17 GM/SCOOP PO POWD
17 g | Freq: Every day | ORAL | 0 refills | 28 days | Status: AC
Start: 2023-01-04 — End: ?
  Filled 2023-01-05 (×4): qty 476, 28d supply, fill #0

## 2023-01-04 MED ORDER — OXYCODONE HCL 5 MG PO TABS
5 mg | ORAL_TABLET | ORAL | 0 refills | 2.00000 days | Status: AC | PRN
Start: 2023-01-04 — End: ?
  Filled 2023-01-05 (×2): qty 10, 2d supply, fill #0

## 2023-01-04 MED ADMIN — APIXABAN 5 MG PO TABS: 5 mg | ORAL | @ 15:00:00 | Stop: 2023-01-04 | NDC 00003089431

## 2023-01-04 MED ADMIN — POTASSIUM CHLORIDE CRYS ER 20 MEQ PO TBCR: 40 meq | ORAL | @ 15:00:00 | Stop: 2023-01-04 | NDC 00245531989

## 2023-01-04 MED ADMIN — DULOXETINE HCL 30 MG PO CPEP: 60 mg | ORAL | @ 15:00:00 | Stop: 2023-01-04 | NDC 00904704461

## 2023-01-04 MED ADMIN — DOCUSATE SODIUM 100 MG PO CAPS: 100 mg | ORAL | @ 03:00:00 | Stop: 2023-01-04 | NDC 00904718361

## 2023-01-04 MED ADMIN — FUROSEMIDE 40 MG PO TABS: 40 mg | ORAL | @ 03:00:00 | Stop: 2023-02-02 | NDC 00054829925

## 2023-01-04 MED ADMIN — OXYCODONE HCL 5 MG PO TABS: 15 mg | ORAL | @ 14:00:00 | Stop: 2023-01-04 | NDC 68084035411

## 2023-01-04 MED ADMIN — EZETIMIBE 10 MG PO TABS: 5 mg | ORAL | @ 15:00:00 | Stop: 2023-01-04 | NDC 60687037321

## 2023-01-04 MED ADMIN — SIMVASTATIN 20 MG PO TABS: 40 mg | ORAL | @ 03:00:00 | Stop: 2023-01-04 | NDC 63739057210

## 2023-01-04 MED ADMIN — FUROSEMIDE 40 MG PO TABS: 40 mg | ORAL | @ 15:00:00 | Stop: 2023-01-04 | NDC 00054829925

## 2023-01-04 MED ADMIN — MUPIROCIN 2 % EX OINT: TOPICAL | @ 03:00:00 | Stop: 2023-01-04 | NDC 68462018022

## 2023-01-04 MED ADMIN — LISINOPRIL 2.5 MG PO TABS: 5 mg | ORAL | @ 15:00:00 | Stop: 2023-01-04 | NDC 60687065611

## 2023-01-04 MED ADMIN — APIXABAN 5 MG PO TABS: 5 mg | ORAL | @ 03:00:00 | Stop: 2023-01-04 | NDC 00003089431

## 2023-01-04 MED ADMIN — PANTOPRAZOLE SODIUM 40 MG PO TBEC: 40 mg | ORAL | @ 15:00:00 | Stop: 2023-01-04 | NDC 60687073609

## 2023-01-04 MED ADMIN — MUPIROCIN 2 % EX OINT: TOPICAL | @ 15:00:00 | Stop: 2023-01-04 | NDC 68462018022

## 2023-01-04 MED ADMIN — OXYCODONE HCL 5 MG PO TABS: 15 mg | ORAL | @ 03:00:00 | Stop: 2023-01-04 | NDC 68084035411

## 2023-01-04 MED ADMIN — DOCUSATE SODIUM 100 MG PO CAPS: 100 mg | ORAL | @ 15:00:00 | Stop: 2023-01-04 | NDC 00904718361

## 2023-01-04 NOTE — Nursing Note
Admit Dx: CSF leak [G96.00]  Pertinent Hx:  has a past medical history of Anxiety, Balance disorder, Brain tumor (benign) (HCC/RAF), Chronic pain, CSF leak, Depression, GERD (gastroesophageal reflux disease), Headache, Hearing loss, Hyperlipidemia, Hypertension, Lupus (HCC/RAF), Post-operative nausea and vomiting, Stroke (HCC/RAF), and Vision abnormalities.    Neuro: Aox4. Hearing L absent, Slight R sided weakness. BLE swelling    COA/Restraint: None  Hold: None  CV: Monitored / Normal sinus rhythm None  Heart Rate:  [54-71] 71 BP: (111-129)/(44-56) 122/51  Resp:   None (Room air) /   /    Airways: Spontaneous  Resp:  [12-20] 19 SpO2:  [93 %-98 %] 96 %  ETCO2:        GI: Diet: Diet carbohydrate controlled  Last BM: 01/02/23 Stool Source:    Tubes: No active LDA found  1  GU: Indwelling urinary catheter / No active LDA found  1  I&O: 08/30 1901 - 08/31 1900  In: 900 [P.O.:660]  Out: -   Net: 900  MS: BMAT: Level 4 - [place]: 35  Activity:   Skin:   Wounds:   Wound 01/01/23 Incision Left Abdomen (2)  Wound 01/01/23 Incision Left Ear (2)    Drains: None  ID: Temp: [36.2 - 36.9]  36.9 C      Sepsis Screen Positive? No    Isolation Info:   Cultures:   Abx:  Endo:   AccuChek: QAC&HS  Pain:  Head 7/10  []  tylenol and Oxy  Other Labs:    Lines/Infusion:  Peripheral IV 16 G Right Antecubital (2)       Shift Events:   []  Up in the chair, ambulated to the restroom and in hall way independently.   []  [place] BLE, 8/31  []  Compression stockings provided as ordered    To Do/Pending:

## 2023-01-04 NOTE — Progress Notes
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     8/30: downgrade to ENT  8/31: tilt test negative, no salty/metallic taste       Exam  Temp:  [36.4 ?C (97.5 ?F)-37.4 ?C (99.3 ?F)] 37.2 ?C (99 ?F)  Heart Rate:  [61-75] 71  Resp:  [12-21] 20  BP: (97-128)/(47-56) 97/48  NBP Mean:  [63-75] 63  SpO2:  [94 %-97 %] 94 %    Neurologic Exam:  A&Ox3, EOMI, FS, TM  Fcx4, BLE 5/5, BLE 5/5, No drift       Drains:   Output by Drain (mL) 01/02/23 0701 - 01/03/23 0700 01/03/23 0701 - 01/04/23 0700 01/04/23 0701 - 01/04/23 1027   Patient has no LDAs of requested type attached.           Labs / Imaging  Recent Labs     01/04/23  0558 01/03/23  0559 01/02/23  0404   NA 142 141 144   WBC 6.40 8.11 9.47   HGB 12.3 13.0 12.0   PLT 191 207 204     Medications:  Steroids:     Anti-infectives:     Anti-epileptics & pain medications:  acetaminophen, 650 mg, Oral, Q6H PRN  oxyCODONE, 5 mg, Oral, Q4H PRN   Or  oxyCODONE, 10 mg, Oral, Q4H PRN   Or  oxyCODONE, 15 mg, Oral, Q4H PRN      Heme:  apixaban, 5 mg, Oral, BID        Imaging:           Assessment  Threasa Beards  ENT    Plan:  Appreciate H&N care  No contraindications to dispo from nsgy perspective   Absorbable suture.     Please page [phone] w/ questions

## 2023-01-04 NOTE — Progress Notes
Pharmaceutical Services - Admission Medication Reconciliation Note      Patient Name: Elizabeth Santana  Medical Record Number: [id number]  Admit date: 01/01/2023 5:21 AM    Age: 77 y.o.  Sex: female  Allergies:   Allergies   Allergen Reactions    Gabapentin      Other Reaction(s): Nausea Only    Dizziness,     Dizziness,    Losartan      Other Reaction(s): Swelling    Atorvastatin      Instability, dizzy    Duloxetine Hcl Itching     Other Reaction(s): Nausea Only    headaches    Ezetimibe     Lisinopril Cough    Other      Other Reaction(s): Other (See Comments)    REACTION: shortness of breath   hives    Peanut Oil      Other Reaction(s): Other (See Comments)    REACTION: hives    REACTION: hives   REACTION: hives    Shrimp      hives    Shrimp Flavor      hives    Sulfa Antibiotics      REACTION: shortness of breath    Lovastatin      Other Reaction(s): Other (See Comments)    Leg swelling    Pravastatin Itching     Itching all over    Rosuvastatin      Other Reaction(s): Other (See Comments)    headaches,  muscle pain, weakness, dizziness, lack of energy,  and constipation     Height:   Most recent documented height   01/01/23 1.626 m (5' 4'')     Actual Weight:   Most recent documented weight   01/01/23 85 kg   10/06/22 81.6 kg     Diagnosis: The patient is currently admitted with the following concerns/issues: Active Problems:    * No active hospital problems. *    Reported Medication History   I wasn't able to update the home medication list for this hospital admission.     Admission medication reconciliation is unable to be completed because discharge med rec has been completed by the primary team and/or discharge orders have been placed already.     No changes have been made to PTA List.        PTA Medication List (discrepancies are noted)   Medications Prior to Admission   Medication Sig Last Dose    ALPRAZolam 0.5 mg tablet Take 1 tablet (0.5 mg total) by mouth as needed for.     apixaban (ELIQUIS) 5 mg tablet Take 1 tablet (5 mg total) by mouth two (2) times daily.     DULoxetine 60 mg DR capsule 1 capsule (60 mg total) daily. 12/31/2022 at 0900    ezetimibe 10 mg tablet Take 0.5 tablets (5 mg total) by mouth daily. 12/31/2022 at 1800    furosemide 40 mg tablet Take 1 tablet (40 mg total) by mouth two (2) times daily. 12/31/2022 at 2000    lisinopril 5 mg tablet Take 1 tablet (5 mg total) by mouth daily. 12/31/2022 at 0900    omeprazole 20 mg DR capsule Take 1 capsule (20 mg total) by mouth daily. 12/31/2022 at 2000    potassium chloride 20 mEq ER tablet Take 2 tablets (40 mEq total) by mouth two (2) times daily. 12/31/2022 at 2000    simvastatin 40 mg tablet Take 1 tablet (40 mg total) by mouth at bedtime. 12/31/2022 at 2000  Discharge Prescription Preference:   [id number] [place], [place] - [address]  [address]  [place] [address]    The patient's allergies and medications have not been reviewed and updated.     Barrington Ellison, Medication Reconciliation Pharmacy Technician, 01/03/2023, 12:30 PM  ___________________________________________________________________________________________________________________     I reviewed the prior to admission medication list compiled by the medication reconciliation pharmacy technician and attest to the above.      Admission medication reconciliation is unable to be completed because discharge med rec has been completed by the primary team and/or discharge orders have been placed already.       Benn Tarver L Hasheem Voland 01/03/2023 5:00 PM   Medication Reconciliation Pharmacist

## 2023-01-04 NOTE — Nursing Note
DISCHARGE TEAM NOTE:    Escorted pt off unit by [facility] CP to [facility] entrance  Home w/ family

## 2023-01-06 NOTE — Op Note
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    NAME OF OPERATION:  1. Left mastoidectomy for approach of middle cranial fossa encephaloceles.   2. Repair of skull-based defect in left middle cranial fossa. (Of the 2 defects, Dr. Threasa Santana fixed the more lateral defect, Dr. Baird Santana fixed the more medial defect due to ossicular involvement.).  3. Abdominal fat graft.      SURGEON:  Elizabeth Santana S. Elizabeth Cancer, MD [phone])      INDICATIONS FOR PROCEDURE:  Elizabeth Santana is a delightful, 77 year old woman who does have beta transferrin positive drainage which was documented preoperatively. She was referred after CT scan showed 2 separate skull base defects in the mastoid tegmen and over the middle ear space with likely encephaloceles and cerebral spinal fluid leakage. After discussion with Dr. Threasa Santana, we did agree to proceed with a transmastoid approach as she does have a history of strokes and is currently on blood thinners. Dr. Threasa Santana did not want to go from above using a traditional middle cranial fossa craniotomy given her comorbidities as such. Risks, benefits, alternatives and complications were discussed with her. She agreed to the procedure.    DESCRIPTION OF PROCEDURE:  She was brought in the operating room and successfully inducted under general endotracheal anesthesia. The bed was rotated 180 degrees. The head was turned to the right, exposing the left ear. The postauricular area was shaved, and the hair was held aside with a cut 10/10 drape. The neuromonitoring team placed all monitoring devices including the facial nerve monitoring. After prepping and draping, both the postauricular area as well as the abdomen for an abdominal fat graft, I did start by making a postauricular incision down through the skin and subcutaneous tissue. An offset periosteal T was made, and the mastoid cortical bone was exposed.     At this point, the approach from below through the mastoid was done with a size 5 cutting bur. The mastoid outer cortical cells were opened, and the canal wall was left up. The dissection was taken more medially until the sigmoid sinus and mastoid tip were opened as well as the antrum widely. Identification of the lateral canal was performed. The dissection was taken further until the incus pointer was exposed for a good landmark of the facial nerve, which was stimulated and confirmed. Continued dissection with smaller diamond burs did reveal the remainder of the incus body and malleus head, and a small encephalocele could be seen through the roughly 4 to 5 mm defect overlying the ossicles. A 2nd defect in the mastoid posterior to this area was also identified with encephalocele formation, and a piece of this was sent for permanent pathology. After the exposure was optimized, I did ask Dr. Threasa Santana to assist with the repair of the encephaloceles. He did take TachoSil and tuck this underneath the tegmen bone between the bone and the dura. The encephalocele was cauterized with bipolar cautery and excised. There was no active spinal fluid leakage after the graft was placed. In similar fashion, due to the ossicular involvement, the 2nd encephalocele was supported with TachoSil between the ossicles. I did not need to excise the dura in this location, and there was no active spinal fluid. After the skull base defect was repaired, I then turned  to the abdominal fat graft.     A separate incision in the left lower quadrant allowed access to the abdominal fat. Roughly a golf-ball-sized piece was removed. The wound bed was dried copiously given her continued use of anticoagulation. The wound was closed with interrupted deep Vicryl sutures and a subcuticular closure for the skin. An OpSite was placed over this. The fat was then cut to the appropriate size and then used to obliterate the entire mastoid. I did not oblitterate the middle ear space. There was no active spinal fluid leakage at the end of this procedure.     At this point, the periosteum was closed tightly with interrupted 4-0 Vicryl suture. The deep skin was closed with the same. The skin was closed with a running 5-0 plain gut for the postauricular area. A Glasscock sterile dressing was placed. She was extubated and taken stable to recovery room. There were no apparent complications. She will continue on her anticoagulation soon after the surgery to minimize her stroke risk.      Elizabeth Santana S. Elizabeth Cancer, MD [phone])        QSG/MODL CONF#: [id number]  D: 01/05/2023 17:31:36 T: 01/05/2023 18:05:05 DOCUMENT: [id number]

## 2023-01-07 LAB — Tissue Exam

## 2023-01-08 ENCOUNTER — Ambulatory Visit: Payer: Commercial Managed Care - Pharmacy Benefit Manager

## 2023-01-09 ENCOUNTER — Telehealth: Payer: Commercial Managed Care - Pharmacy Benefit Manager

## 2023-01-09 MED ORDER — OXYCODONE HCL 5 MG PO TABS
5 mg | ORAL_TABLET | ORAL | 0 refills | PRN
Start: 2023-01-09 — End: ?

## 2023-01-09 MED ORDER — OXYCODONE HCL 5 MG PO TABS
5 mg | ORAL_TABLET | ORAL | 0 refills | Status: AC | PRN
Start: 2023-01-09 — End: ?

## 2023-01-09 NOTE — Telephone Encounter
Patient called resident pager and stated that she is out of her previously prescribed oxycodone and still having some pain. Did not endorse any other symptoms other than superficial shooting pain around her surgery area. Discussed that she should follow up with her PCP as she is now back in [place], [place] and reviewed pain medication schedule. ER precautions also provided.

## 2023-01-10 MED FILL — OXYCODONE HCL 5 MG PO TABS: 5 mg | ORAL | Qty: 10

## 2023-01-12 ENCOUNTER — Ambulatory Visit: Payer: Commercial Managed Care - Pharmacy Benefit Manager

## 2023-01-12 ENCOUNTER — Telehealth: Payer: Commercial Managed Care - Pharmacy Benefit Manager

## 2023-01-12 MED ORDER — OXYCODONE HCL 5 MG PO TABS
5 mg | ORAL_TABLET | ORAL | 0 refills | Status: AC | PRN
Start: 2023-01-12 — End: ?

## 2023-01-12 NOTE — Telephone Encounter
Call Back Request      Reason for call back: Pt called in requesting if possible for Dr. Threasa Beards to refill oxycodone 5mg  medication, states she was given this medication after surgery on Thursday and now needs a refill, states she also asker her PCP but they advised her to reach out to MD she had surgery with. If able to send prescription please send to [id number] - [place], [place] - [address]  Ardelia Mems to pend medication as it is not on med list.     Any Symptoms:  []  Yes  [x]  No      If yes, what symptoms are you experiencing:    Duration of symptoms (how long):    Have you taken medication for symptoms (OTC or Rx):      If call was taken outside of clinic hours:    [] Patient or caller has been notified that this message was sent outside of normal clinic hours.     [] Patient or caller has been warm transferred to the physician's answering service. If applicable, patient or caller informed to please call us back if symptoms progress.  Patient or caller has been notified of the turnaround time of 1-2 business day(s).

## 2023-01-13 ENCOUNTER — Ambulatory Visit: Payer: Commercial Managed Care - Pharmacy Benefit Manager

## 2023-01-13 MED ORDER — OXYCODONE HCL 5 MG PO TABS
5 mg | ORAL_TABLET | ORAL | 0 refills | Status: AC | PRN
Start: 2023-01-13 — End: ?

## 2023-01-13 MED FILL — OXYCODONE HCL 5 MG PO TABS: 5 mg | ORAL | Qty: 10

## 2023-01-13 NOTE — Telephone Encounter
Returned patients call.

## 2023-01-14 ENCOUNTER — Telehealth: Payer: Commercial Managed Care - Pharmacy Benefit Manager

## 2023-01-14 DIAGNOSIS — R52 Pain, unspecified: Secondary | ICD-10-CM

## 2023-01-14 DIAGNOSIS — G96 CSF leak: Secondary | ICD-10-CM

## 2023-01-14 DIAGNOSIS — Z9089 Acquired absence of other organs: Secondary | ICD-10-CM

## 2023-01-14 NOTE — Progress Notes
PATIENTBridie Santana               MRN: [id number]  DOB:  [birth year]    Patient Consent to Telehealth Questionnaire        No data to display              - I agree  to be treated via a video visit and acknowledge that I may be liable for any relevant copays or coinsurance depending on my insurance plan.  - I understand that this video visit is offered for my convenience and I am able to cancel and reschedule for an in-person appointment if I desire.  - I also acknowledge that sensitive medical information may be discussed during this video visit appointment and that it is my responsibility to locate myself in a location that ensures privacy to my own level of comfort.  - I also acknowledge that I should not be participating in a video visit in a way that could cause danger to myself or to those around me (such as driving or walking).  If my provider is concerned about my safety, I understand that they have the right to terminate the visit.     Due to the Covid-19 pandemic and the increased risk of visiting our multi-specialty facility which services patients including walk-in urgent care, we have offered this service as a temporary emergency measure.    Date of Service: 01/14/2023  This visit was initiated at the request of the patient.  Verbal consent was received.  Permanent Document Storage via the electronic record  Medical record extensively reviewed before and after visit for imaging, medications, past medical history.  Service provided via: real time audio-visual  Location of the patient at the time of service: [place], their residence  Location of provider at the time of service: [facility]   Total time Spent: 30 minutes including review of medical records.      Chief Complaint: f/u s/p left mastoidectomy for approach of middle cranial fossa encephalocele      Subjective:     Elizabeth Santana is a pleasant 77 y.o. 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 Darol Destine fixed the more lateral defect, Dr. 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     Today, the patient presents for her first post-operative appointment, approximately 2 weeks out. The patient reports severe pain near the surgical site and inquires about more pain medication. She continues to experience clear rhinorrhea.  No noted exacerbating or relieving factors.  The patient denies any fever, otalgia, otorrhea, otorrhagia, dizziness, or balance problems. No other associated or modifying otologic factors.    Physical Examination:   Gen: Normocephalic atraumatic   Constitutional: Well nourished, non-obese.   Eyes: Anicteric sclerae, Extra-occular movement normal and symmetric.   Ears: The detailed binocular microscopic ear exam is found in the procedure note listed below.   Respiratory: Breathing comfortably without stridor or retractions.   Cardiovascular: No jugulovenous distention or peripheral edema.   Lymphatic: No sign of cervical lymphatic malformations or adenopathy.   Musculoskeletal: No skeletal abnormalities or deformities.  Extremities: No clubbing or cyanosis.   Skin: No rashes or lesions on the head  or neck.   Neurologic: Alert and oriented. Cranial nerve VII is intact and bilaterally symmetric. Gait is normal. There is no nystagmus.   Psychiatric: Non-anxious with appropriate affect.     Review of Clinical Data:     Imaging:     MR brain wo+w contrast brain lab protocol    Result Date: 01/02/2023  MR BRAIN WO+W CONTRAST BRAIN LAB PROTOCOL HISTORY: ''CSF leak'' COMPARISON: Outside MRI brain 08/06/2022 and 09/26/2022. CT brain 12/31/2022. TECHNIQUE: Multisequence MRI of the brain obtained without and with contrast. CONTRAST: 8.66ml of gadobutrol (Gadavist) 1 mmol/mL inj 10 mL, administered intravenously without complications. FINDINGS: There is no acute infarction. There is a lobulated, mixed signal intensity, heterogeneously enhancing sellar to suprasellar nodule measuring approximately 2.5 x 1.7 x 2.6 cm (craniocaudal oblique by AP oblique by transverse) on series 25 image 88 and series 24 image 124. This was previously 2.4 x 1.6 x 2.5 cm on the outside exam from 09/26/2022 when remeasured and is thought to be similar to the prior study, given differences in technique. Again seen is abutment of approximately 180 degrees of the left cavernous and supraclinoid internal carotid arteries, which are mildly displaced laterally. Less than 180 degrees abutment of the right cavernous and supraclinoid internal carotid artery is present. There is also abutment of the optic chiasm and prechiasmatic optic nerves with likely mild displacement on nondedicated evaluation. The pituitary infundibulum appears to be extending to the superior posterior aspect of the nodule, though normal pituitary tissue is difficult to delineate from the nodule. At the superior aspect of the nodule, there is a T2 hypointense and T1 hyperintense component, which likely reflects interval evolution of the layering internal blood products seen on the prior outside MRI from 08/06/2022. Expansion of the sella is also seen, though bony integrity is better assessed on prior CT. A 0.9 x 0.6 cm enhancing extra-axial dural based nodule is along the left anteromedial frontal convexity on series 23 image 104 and abuts the left aspect of the falx. Again seen are prominent arachnoid granulations of the bilateral sphenoid wings and left posterior paramedian occiput. There is suggestion of encephalomalacia of the adjacent left greater than right anterior temporal lobes and left paramedian cerebellum with possible small amounts of brain parenchyma extending into the granulations, compatible with encephaloceles. Bony integrity is better assessed on CT. Encephalomalacia and gliosis of the left frontal lobe middle frontal gyrus and inferior frontal gyrus and the left posterior superior temporal gyrus near the posterior sylvian fissure with areas of hemosiderin staining, likely sequelae of prior infarct. A few scattered FLAIR hyperintense foci in the subcortical and periventricular white matter, nonspecific, but may represent minimal chronic ischemic microvascular changes. There is ex vacuo dilatation of the left lateral ventricle. Otherwise, ventricles, cisterns, and other CSF containing spaces are diffusely prominent. Mild deflection of the septum pellucidum to the left, related to left cerebral volume loss. There is also a curvilinear T2 hypointense focus along the extra-axial CSF containing space overlying the left superior aspect of the left frontal lobe, which could suggest a small chronic collection versus synechiae/webs. Marked left mastoid and middle ear cavity effusion. The right mastoid air cells are clear. The temporal bones are better assessed on the dedicated same day temporal bone CT. Trace mucosal thickening of the paranasal sinuses.     IMPRESSION: 1.  Left mastoid and left middle ear effusion. Bony integrity of the temporal bone is better characterized on CT temporal bone from the same day. 2.  Prominent arachnoid  granulations of the bilateral sphenoid wings and left posterior paramedian occiput with suggestion of associated encephaloceles. Bony integrity in these regions are better assessed by CT. 3.  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 There has been evolution of internal blood products compared to the hematocrit level seen on the prior outside brain MRI from 08/2022. The nodule contacts and may mildly displace the optic chiasm and prechiasmatic optic nerves and about approximately 180 degrees of the left cavernous and supraclinoid internal carotid artery. 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 This may be further evaluated with a dedicated MRI pituitary study. 4.  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 5.  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 [id number] I, Lajuana Matte, MD, reviewed the above report personally and agree with findings. Dictated by: Jorene Minors   01/01/2023 1:35 PM Signed by: Lajuana Matte   01/02/2023 7:16 PM    CT brain wo contrast brain lab protocol    Result Date: 01/01/2023  CT BRAIN WO CONTRAST BRAIN LAB PROTOCOL HISTORY: ''CSF leak'' COMPARISON: CT temporal bone without contrast dated 12/31/2022; correlation available with brain MR dated 09/26/2022. TECHNIQUE: Multiplanar CT scan of the brain performed without intravenous contrast. Estimated Radiation Dose: The patient received the following exposure event(s) during this study, and the dose reference values for each are as shown (CTDIvol in mGy, DLP in mGy-cm). Note that the values are not patient dose but numbers generated from scan acquisition factors based on 32 cm (L) and/or 16 cm (S) phantoms and may substantially under-estimate or over-estimate actual patient dose based on patient size and other factors. Temporal Bones, CTDI(S): 25.6, DLP: 230;Brainlab, CTDI(S): 75.8, DLP:  2091.2. The following accession numbers are related to this dose report [id number] FINDINGS: Heterogeneous density mass with some spontaneously hyperdense focus in the sella turcica is seen, measuring 23mm CC, consistent with known pituitary macroadenoma. There is hemorrhagic content within the mass and the mass abutting the optic chiasm. Sella turcica has expanded. Arachnoid pits are seen along the sphenoid bones bilaterally. Dehiscence of the left tegmen tympani is seen. There is opacification of the left mastoid air cells and the left middle ear cavity. BRAIN PARENCHYMA: No acute intracranial hemorrhage, mass effect, or midline shift. Frontal and parietal dominant parenchymal volume loss as evidenced by prominent sulci and increased interhemispheric distance. Foci of hypodensity located at the left inferior frontal gyrus and left supramarginal gyrus likely reflect prior infarct. CSF SPACES: Clear basal cisterns. No evidence of hydrocephalus. Ex vacuo enlargement of the left lateral ventricle secondary to focus of left frontal lobe encephalomalacia and gliosis, sequelae of likely prior infarct. CALVARIUM: Arachnoid granulation is seen at the occipital bone. The temporomandibular joint degenerative change is seen on the left. SINUSES/MASTOIDS: Opacification of the left mastoid air cells. Paranasal sinuses are clear.     IMPRESSION: 1. 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 2. 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 Dehiscence of the left tegmen tympani with opacification of left mastoid air cells and middle ear cavity. No fluid in the paranasal sinuses. 3. Old infarct of the left MCA territory. No acute process or intraparenchymal hemorrhage. Cipriano Bunker M.D., reviewed the study and agree with the above findings. Signed by: Angelique Holm   01/01/2023 2:39 PM    IOM Intraoperative monitoring(all types)  Result Date: 01/01/2023  Table formatting from the original result was not included. Intraoperative Evoked Potential SSCD Monitoring Procedure PATIENT: Charish Lysinger MRN: [id number] DOB: May 11, 1945 DATE OF SERVICE: 01/01/2023 REQUESTING PHYSICIAN:  Darol Destine, MD Procedure:  Indications:  Fatima Limback is a 77 y.o. female who presents for a left mastoidectomy surgery today.   Pt has fluid drainage from her left ear canal and nose from mastoid effusions, with a concern for an infection or tumor.  Imaging evidence of tegmen dehiscence lateral to the cochlea.   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   Intraoperative monitoring was conducted to add a degree of safety to the procedure, and to give the surgeon immediate feedback about any adverse changes encountered during monitoring. Intraoperative monitoring is considered medically necessary because there is significant risk to central and peripheral nervous systems including eloquent cortex, brainstem, spinal cord, nerve roots and peripheral nerves related to this procedure. Details of Procedure: During surgery intraoperative evoked potentials were continuously monitored. Electrical stimulation to the median and posterior tibial nerves was delivered at 2.78 per second and click stimuli to the ears were delivered at 11.3 per second.  Evoked potentials were recorded at the neck and scalp. Electrodes were put in place prior to surgery and removed postoperatively. The facial cranial nerve also were monitored. Results: Brainstem Auditory Evoked Potential (BAEP):   Wave I  Wave V  Wave V amp  AS Not monitored na na  AD 1.5 6.2 0.28 Somatosensory Evoked Potentials (SEP): Upper extremity results: Cortical (N20) Left  Right    Latencies (msec) 19.6 18.9    Amplitudes (uV) 1.24 0.64 Lower extremity results: Cortical (P37) Left  Right    Latencies (msec) 44.3 44.6    Amplitudes (uV) 0.76 0.40 Electroencephalogram (EEG): This EEG was performed using disc electrodes placed according to the international 10-20 system of electrode placement. Intraoperative monitoring was conducted to add a degree of safety to the procedure, and to give the surgeon immediate feedback about any adverse changes encountered during monitoring Post-induction, the EEG showed symmetric frontal-central fast activity.  During the procedure, no adverse EEG events were encountered.   Minor changes were seen associated with depth of anesthesia or boluses of medications. The surgeon was informed at baseline that these potentials, amplitudes and latencies were adequate for monitoring  The potentials were monitored continuously. During the procedure these potentials remained stable.  No adverse electrodiagnostic events were encountered during monitoring  2 hours 45 minutes were spent monitoring this case alone. The surgeons were kept informed. Impression: Impression:  No evidence of impairment during monitoring Technologist: Alcario Drought Attending: Maximino Greenland. Nuwer     CT temporal bones wo contrast    Result Date: 01/01/2023  CT TEMPORAL BONES WO CONTRAST HISTORY: ''CSF leak'' COMPARISON: MRI of the brain 09/26/2022. TECHNIQUE: Multiplanar thin-section CT scan of the temporal bones performed without intravenous contrast. Estimated Radiation Dose: The patient received the following exposure event(s) during this study, and the dose reference values for each are as shown (CTDIvol in mGy, DLP in mGy-cm). Note that the values are not patient dose but numbers generated from scan acquisition factors based on 32 cm (L) and/or 16 cm (S) phantoms and may substantially under-estimate or over-estimate actual patient dose based on patient size and other factors. Brainlab, CTDI(S): 75.8, DLP: 2091.2;Temporal Bones, CTDI(S): 25.6, DLP: 230. The following accession numbers are related to this dose report [id number] FINDINGS: Right temporal bone: External auditory canal: Clear. Tympanic membrane: Unremarkable. Middle ear  cavity: Clear. Normal appearance of ossicles. No evidence of bony erosion. Inner ear structures: Normal appearance of cochlea, vestibule, semicircular canals and internal auditory canal. Mastoid air cells: Clear. Left temporal bone: External auditory canal: Small amount of debris is seen which may represent cerumen or wax. Tympanic membrane: Bowing of the tympanic membrane is present. Middle ear cavity: Opacification of the middle ear cavity is seen without evidence of ossicular or bony erosion. The scutum is intact. The tegmen tympani demonstrates marked thinning or bone dehiscence. This measures 4 mm transverse by 4 mm AP. Internal ear structures: Normal appearance of cochlea, vestibule, semicircular canals and internal auditory canal. Mastoid air cells: Opacification of the mastoid air cells are noted without loss of the normal mastoid architecture. There is additionally marked thinning or absence of bone associated with a portion of the tegmen mastoideum (6-1 03) measuring 4 mm AP by  6 mm transverse. Other: There is expansion of the sella region due to an underlying mass in the sellar region. In addition, there is mild expansion of the foramen ovale on the left compared to the right side. Moderate to severe degenerative changes of the temporomandibular joints bilaterally seen left greater than right.     IMPRESSION: 1. Opacification of the left mastoid air cells and middle ear cavity. Description of marked thinning or dehiscence of the tegmen tympani as well as the tegmen mastoideum as described above. The potential for CSF leak is suggested.. 2. 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 Further assessment of the brain with contrast recommended. This mass was seen on the prior MRI of the brain from 09/26/2022 3. 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  Telemedicine visit. Exam deferred.      Impression/Plan:     IMPRESSION: Teddie Collado is a 77 y.o. female  who does have beta transferrin positive drainage which was documented preoperatively. She was referred after CT scan showed 2 separate skull base defects in the mastoid tegmen and over the middle ear space with likely encephaloceles and cerebral spinal fluid leakage. After discussion with Dr. Threasa Beards, we did agree to proceed with a transmastoid approach as she does have a history of strokes and is currently on blood thinners. Dr. Threasa Beards did not want to go from above using a traditional middle cranial fossa craniotomy given her comorbidities as such.     The patient underwent  LEFT mastoidectomy for approach of middle cranial fossa encephaloceles, repair of skull-based defect in left middle cranial fossa (of the 2 defects, Dr. Darol Destine fixed the more lateral defect, Dr. Baird Cancer fixed the more medial defect due to ossicular involvement), abdominal fat graft  performed on 01/01/23  and is having an uneventful recovery.     Limited examination due to nature of the visit however abdominal graft looks well healed. Based upon history and otomicroscopic evaluation, the patient appears to be healing well without evidence for concern.     I prescribed oxycodone to be taken every 6 hours as needed for post-operative pain. Risks, benefits, alternatives, and complications were thoroughly explained to the patient. Additionally, I recommend post-operative evaluation in 3 months. Patient agreed.     The patient was reassured regarding these findings, extensively counseled regarding risk factors and warning signs to  watch for and they will follow-up with me in 3 months.  If there were to be any concerning or worrisome findings, they will immediately contact me.      PLAN:   >Ms. Mcfaul was instructed to return to clinic as needed or for any signs of infection including increased aural drainage, fever and aural swelling.  >Follow up in 3 months or sooner if any acute otologic issues arise.    Tea Oganesyan was given an opportunity to ask questions, and all questions asked were answered to the best of my ability. I encouraged Mata Spare to email me with any further questions or concerns, or if an acute issue arises. Janara Lassen was in agreement with this plan.      Thank you for the kind referral and for allowing me to share in the care of Judene Lagrange. If you have any questions, please do not hesitate to contact me.      Sincerely,    Radene Knee, MD  Professor  [facility] Department of Head & Neck Surgery  [facility]    [address] [place], [address]  [place] [address]  [phone] (Appointments)  [phone] (Academic Office)  [phone] (Fax)      ATTESTATION      SCRIBE SIGNATURE(S):  I, Zandra Abts, am scribing for, and in the presence of Radene Knee, MD with the documentation for Elizabeth Santana on 01/14/2023 at 5:02 PM. Please excuse typographical errors that may have occurred in scribe transcription.    PHYSICIAN SIGNATURE(S):  Radene Knee, MD 01/14/2023 5:02 PM    I have reviewed this note, written by Zandra Abts and attest that it is an accurate representation of my H & P and other events of the outpatient visit except if otherwise noted.

## 2023-01-15 ENCOUNTER — Ambulatory Visit: Payer: Commercial Managed Care - Pharmacy Benefit Manager

## 2023-01-15 MED ORDER — OXYCODONE HCL 5 MG PO TABS
5 mg | ORAL_TABLET | Freq: Four times a day (QID) | ORAL | 0 refills | Status: AC | PRN
Start: 2023-01-15 — End: ?

## 2023-01-15 MED ORDER — ACETAMINOPHEN 325 MG PO TABS
650 mg | ORAL_TABLET | Freq: Four times a day (QID) | ORAL | 0 refills | Status: AC | PRN
Start: 2023-01-15 — End: ?

## 2023-01-15 NOTE — Telephone Encounter
Received phone call from  via page operator.     Pt reports that the instructions are unclear about his oxycodone per pharmacist; When requesting pain medication from PCP, PCP only prescribed meds for nerve spasm and gabapentin which are inadequate for pain control; Pt currently in 7.5/10 pain. Pt reported that Dr. Baird Cancer recommended refill of Oxycodone    Pt requests presc  [facility] [address] [place]  [phone]       Return precautions discussed, all questions answered.

## 2023-01-15 NOTE — Telephone Encounter
Patient had an appt with Dr. Baird Cancer today and RX has been sent out to pt pharmacy of choice.       Thank you!

## 2023-01-16 MED FILL — ACETAMINOPHEN 325 MG PO TABS: 325 mg | ORAL | Qty: 45

## 2023-01-16 NOTE — Telephone Encounter
Will address in a different encounter

## 2023-01-18 MED ORDER — ACETAMINOPHEN 325 MG PO TABS
650 mg | ORAL_TABLET | Freq: Four times a day (QID) | ORAL | 0 refills | 6.00000 days | Status: AC | PRN
Start: 2023-01-18 — End: ?
  Filled 2023-02-02 (×2): qty 45, 6d supply, fill #0

## 2023-01-19 ENCOUNTER — Ambulatory Visit: Payer: Commercial Managed Care - Pharmacy Benefit Manager

## 2023-01-19 MED ORDER — OXYCODONE HCL 5 MG PO TABS
5 mg | ORAL_TABLET | Freq: Four times a day (QID) | ORAL | 0 refills | Status: AC | PRN
Start: 2023-01-19 — End: ?

## 2023-01-20 ENCOUNTER — Ambulatory Visit: Payer: Commercial Managed Care - Pharmacy Benefit Manager

## 2023-01-20 DIAGNOSIS — E237 Disorder of pituitary gland, unspecified: Secondary | ICD-10-CM

## 2023-01-20 MED FILL — OXYCODONE HCL 5 MG PO TABS: 5 mg | ORAL | Qty: 30

## 2023-01-20 NOTE — Telephone Encounter
Addressed in a separate encounter

## 2023-01-23 ENCOUNTER — Ambulatory Visit: Payer: PRIVATE HEALTH INSURANCE

## 2023-01-25 MED ORDER — OXYCODONE HCL 5 MG PO TABS
5 mg | ORAL_TABLET | Freq: Four times a day (QID) | ORAL | 0 refills | Status: AC | PRN
Start: 2023-01-25 — End: ?

## 2023-01-26 ENCOUNTER — Telehealth: Payer: PRIVATE HEALTH INSURANCE

## 2023-01-26 DIAGNOSIS — E237 Disorder of pituitary gland, unspecified: Secondary | ICD-10-CM

## 2023-01-26 NOTE — Progress Notes
PATIENTTerrian Santana   MRN: [id number]  DOB: 1945-09-30  DATE OF SERVICE: 01/26/2023   Verbal consent was obtained from the patient to proceed with the telemedicine consultation via a secure, HIPAA compliant web interface. Patient expressed full understanding that the interaction will remain strictly confidential and that the patient has the right to stop the session immediately, if he/she wishes.     Neurosurgery Telemedicine Progress Note     ID:  Patient is 77 y.o. 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 I last saw the patient on 10/06/2022 at which time patient reported disequilibrium and balance issues, as well as fluid drainage from the left ear over 6 months ago, concerning for left ear infection for which she was started on a course of antibiotics. She had beta 2 transferrin sent, which was positive. She consulted with ENT that placed a patch to stop the leaking. It worked partially and started coming out the nose. 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 Since then, the patient is now s/p left mastoidectomy and CSF leak repair with left abdominal fat graft on 01/01/2023 with Dr. Threasa Beards of Neurosurgery and Dr. Baird Cancer of ENT. I had the pleasure of speaking with the patient today via telemedicine conference for follow-up and review of recent imaging.   Interval History: She denies any significant ear drainage since surgery. She notes that her blurry vision has worsened since hospitalization and compared to 5 months ago, and unable to read.     Physical Exam:   Ht 5' 4'' (1.626 m)  ~ Wt 180 lb (81.6 kg)  ~ BMI 30.90 kg/m?   General: No acute distress.  Neurological: Awake, attentive and interactive.    Medications  Medications that the patient states to be currently taking   Medication Sig    acetaminophen 325 mg tablet Take 2 tablets (650 mg total) by mouth every six (6) hours as needed.    ALPRAZolam 0.5 mg tablet Take 1 tablet (0.5 mg total) by mouth as needed for.    apixaban (ELIQUIS) 5 mg tablet Take 1 tablet (5 mg total) by mouth two (2) times daily.    DULoxetine 60 mg DR capsule 1 capsule (60 mg total) daily.    ezetimibe 10 mg tablet Take 0.5 tablets (5 mg total) by mouth daily.    furosemide 40 mg tablet Take 1 tablet (40 mg total) by mouth two (2) times daily.    lisinopril 5 mg tablet Take 1 tablet (5 mg total) by mouth daily.    omeprazole 20 mg DR capsule Take 1 capsule (20 mg total) by mouth daily.    oxyCODONE 5 mg tablet Take 1 tablet (5 mg total) by mouth every six (6) hours as needed. Max Daily Amount: 20 mg    polyethylene glycol powder Mix 1 capful (17 grams) in liquid and drink by mouth daily.    potassium chloride 20 mEq ER tablet Take 2 tablets (40 mEq total) by mouth two (2) times daily.    simvastatin 40 mg tablet Take 1 tablet (40 mg total) by mouth at bedtime.         Review of Outside Records:    MRI: I have personally reviewed the MRI dated 12/31/2022 and have reviewed the radiologist  interpretation with which I agree:  IMPRESSION:     1.  Left mastoid and left middle ear effusion. Bony integrity of the temporal bone is better characterized on CT temporal bone from the same day.  2.  Prominent arachnoid granulations of the bilateral sphenoid wings and left posterior paramedian occiput with suggestion of associated encephaloceles. Bony integrity in these regions are better assessed by CT.  3.  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 There has been evolution of internal blood products compared to the hematocrit level seen on the prior outside brain MRI from 08/2022. The nodule   contacts and may mildly displace the optic chiasm and prechiasmatic optic nerves and about approximately 180 degrees of the left cavernous and supraclinoid internal carotid artery. 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 This may   be further evaluated with a dedicated MRI pituitary study.  4.  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  5.  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      Assessment:   77 y.o. 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 I last saw the patient on 10/06/2022 at which time patient reported disequilibrium and balance issues, as well as fluid drainage from the left ear over 6 months ago, concerning for left ear infection for which she was started on a course of antibiotics. She had beta 2 transferrin sent, which was positive. She consulted with ENT that placed a patch to stop the leaking. It worked partially and started coming out the nose. 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 Since then, the patient is now s/p left mastoidectomy and CSF leak repair with left abdominal fat graft on 01/01/2023 with Dr. Threasa Beards of Neurosurgery and Dr. 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 I advised that it is unclear whether the tumor is related to her reported vision symptoms, for which it may be reasonable to consider a vision assessment locally. I discussed the various treatment options of conservative/ non-operative management, neurosurgical intervention, versus radiation treatment, including the risks and benefits of each option. After careful consideration of the options, further workup is indicated prior to surgical intervention. Additionally, I also recommend the patient continue to recover from her recent surgery for CSF leak repair.   At this time, I recommend a referral to Endocrinology Dr. Silvana Newness to establish care for pituitary management, and that the patient obtain a complete pituitary lab panel. I also recommended an external referral to Ophthalmology for formal visual field testing as baseline. If the tumor is not causing any vision issues, I would recommend continued observation with serial imaging scans. The patient has expressed an understanding and agreement of the plan. All questions were answered.    Plan:   - Obtain complete pituitary lab panel  - External referral to Ophthalmology for formal visual field testing  - Referral to Endocrinology Dr. Silvana Newness to establish care    30 minutes were spent personally by me today on this  encounter which include today's pre-visit review of the chart, obtaining appropriate history, performing an evaluation, documentation and discussion of management with details supported within the note for today's visit. The time documented was exclusive of any time spent on the separately billed procedure.    SCRIBE SIGNATURE(S):   I, Hugh Garrow Alohilani Verneal Wiers, have assisted Aquilla Hacker, MD with the documentation for Elizabeth Santana on 01/26/2023 at 4:00 PM.    PHYSICIAN SIGNATURE(S):  Aquilla Hacker, MD  01/26/2023 4:00 PM    I have reviewed this note, written by Jarome Trull Alohilani Yarelie Hams and attest that it is an accurate representation of my H & P and other events of the outpatient visit except if otherwise noted.

## 2023-01-27 ENCOUNTER — Ambulatory Visit: Payer: PRIVATE HEALTH INSURANCE

## 2023-01-28 ENCOUNTER — Telehealth: Payer: PRIVATE HEALTH INSURANCE

## 2023-01-28 ENCOUNTER — Ambulatory Visit: Payer: PRIVATE HEALTH INSURANCE

## 2023-01-28 NOTE — Telephone Encounter
referral to [facility] to establish care; pituitary labs; repeat ophtho eval in [place] (and let?s get records)     Order Placed/signed. Reached out to Dr. Morene Antu office to confirm visit for 10/03. Left VM to further discuss.      Called to confirm where the patient would like to repeat labs and complete ortho eval. Need to confirm where the patient completed prior studies to obtain records

## 2023-01-29 ENCOUNTER — Telehealth: Payer: PRIVATE HEALTH INSURANCE

## 2023-01-29 NOTE — Telephone Encounter
Call Back Request      Reason for call back: Pt calling to see if you wil ltake her on as new pt for pituitary lesion. Regerred by Dr. Selena Batten. Also wants to know if you will do Video visit since she lives in [place].    Any Symptoms:  []  Yes  [x]  No      If yes, what symptoms are you experiencing:    Duration of symptoms (how long):    Have you taken medication for symptoms (OTC or Rx):      If call was taken outside of clinic hours:    [] Patient or caller has been notified that this message was sent outside of normal clinic hours.     [] Patient or caller has been warm transferred to the physician's answering service. If applicable, patient or caller informed to please call us back if symptoms progress.  Patient or caller has been notified of the turnaround time of 1-2 business day(s).

## 2023-01-29 NOTE — Telephone Encounter
Reply by: Savir Blanke  Yes, we can do video visit.  Thanks

## 2023-01-29 NOTE — Telephone Encounter
Hi Dr. Silvana Newness,    Please see patients request. Pt was scheduled 10/4 at 8:20 per email with neurosurgery department however pt canceled appointment.  Please see below and advise, thanks.    Forwarded by: Arline Asp

## 2023-01-29 NOTE — Telephone Encounter
Call Back Request      Reason for call back: Pt is requesting a call back from vanessa to discuss telephone encounter dated 09/25 in detail.     Any Symptoms:  []  Yes  [x]  No      If yes, what symptoms are you experiencing:    Duration of symptoms (how long):    Have you taken medication for symptoms (OTC or Rx):      If call was taken outside of clinic hours:    [] Patient or caller has been notified that this message was sent outside of normal clinic hours.     [] Patient or caller has been warm transferred to the physician's answering service. If applicable, patient or caller informed to please call us back if symptoms progress.  Patient or caller has been notified of the turnaround time of 1-2 business day(s).

## 2023-01-29 NOTE — Telephone Encounter
Patient has been added back to the schedule 10/04

## 2023-01-30 ENCOUNTER — Ambulatory Visit: Payer: Commercial Managed Care - Pharmacy Benefit Manager

## 2023-01-30 NOTE — Telephone Encounter
Sent the patient a mychart message with postop scrub details         repeat ophtho eval in [place] (and let?s get records)

## 2023-02-06 ENCOUNTER — Ambulatory Visit: Payer: Commercial Managed Care - Pharmacy Benefit Manager

## 2023-02-06 ENCOUNTER — Ambulatory Visit: Payer: Commercial Managed Care - Pharmacy Benefit Manager | Attending: "Endocrinology

## 2023-02-06 ENCOUNTER — Telehealth: Payer: Commercial Managed Care - Pharmacy Benefit Manager | Attending: "Endocrinology

## 2023-02-06 DIAGNOSIS — D352 Benign neoplasm of pituitary gland: Secondary | ICD-10-CM

## 2023-02-06 DIAGNOSIS — D6861 Antiphospholipid syndrome: Secondary | ICD-10-CM

## 2023-02-06 NOTE — Progress Notes
Endocrine Visit      PATIENT: Elizabeth Santana  MRN: [id number]  DOB: [birth year]  REFERRING PRACTITIONER: Madelyn Brunner, MD   PRIMARY CARE PROVIDER: Madelyn Brunner, MD    Patient Consent to Telehealth   The patient agreed to participate in the video visit prior to joining the visit.           Patient ID: Elizabeth Santana is a 77 y.o. female.    HPI  Comes for management of pituitary adenoma s/p TNTS in 2010 [facility] [place]), complicated by hemorrhagic left MCA strokes.  CSF leak/mastoid effusions status post repair January 01, 2023. History of lupus on anticoagulation, complex regional pain syndrome of the labs risk, hypertension, hyperlipidemia    Pituitary history   Diagnosed with pituitary tumor in 2020 during evaluation of vision loss.  Her Ophthalmology at the time recommended MRI that confirmed pituitary lesion, and was referred to Neurosurgery who recommended resection.  S/p TNTS in 2010 in [place], complicated by CVA. Has residual speech issues.   Pathology not available. History obtained from patient and chart review, no records available.  Patient was not told that pituitary lesion was producing any hormones, and no pituitary hormone replacement was recommended before or after surgery.  Vision improved after surgery    Spring 2024 started with drainage from left ear, eventually diagnosed with CSF leak.  Around the same time also started with vision disturbance, blurry vision.  January 01, 2023 status post left mastoidectomy and CSF leak repair with abdominal graft on       December 31, 2022 MRI with stable expanded sella containing sellar to suprasellar nodule with compression of optic chiasm.  She has eye exam scheduled for February 13, 2023         +headaches, intermittently  + blurry vision, as above.  Worse since August 2020 for surgery.  Off balance, needs cane  +Gained weight since initial surgery 2010, +20lb, current weight 180lb  No nausea/vomiting   No changes in bowel movements  +cold intolerance. Chronic. No nipple discharge  Has 2 adult kids, had them age 70 and 20 yo. No difficult getting pregnant. FMP 47, age 36  + easy bruising, on anticoagulation for APLS  + muscle weakness  no purple striae  no hirsutism or acne.   No changes in shoe or ring size, spacing of teeth, no snoring, no symptoms suggestive of carpal tunnel.     + family history of pituitary disease (son has pituitary tumor, treated with medication ?PRLnoma, also mother had pituitary tumor diagnosed later in life but no treatment needed).      Patient Active Problem List    Diagnosis Date Noted    Antiphospholipid syndrome (HCC/RAF) 02/06/2023    Pituitary macroadenoma with extrasellar extension (HCC/RAF) 02/06/2023    Prediabetes 08/06/2016    Hyperlipidemia 12/04/2006       Past Medical History  Past Medical History:   Diagnosis Date    Anxiety     Balance disorder     Brain tumor (benign) (HCC/RAF)     Chronic pain     CSF leak     Depression     GERD (gastroesophageal reflux disease)     Headache     Hearing loss     Hyperlipidemia     Hypertension     Lupus (HCC/RAF)     Post-operative nausea and vomiting     Stroke (HCC/RAF)     Vision abnormalities        Past Surgical  History  Past Surgical History:   Procedure Laterality Date    HYSTERECTOMY  1998    PITUITARY SURGERY  2010    WRIST FRACTURE SURGERY Left 2021       Medications  Current Outpatient Medications   Medication Sig    acetaminophen 325 mg tablet Take 2 tablets (650 mg total) by mouth every six (6) hours as needed.    ALPRAZolam 0.5 mg tablet Take 1 tablet (0.5 mg total) by mouth as needed for.    apixaban (ELIQUIS) 5 mg tablet Take 1 tablet (5 mg total) by mouth two (2) times daily.    DULoxetine 60 mg DR capsule 1 capsule (60 mg total) daily.    ezetimibe 10 mg tablet Take 0.5 tablets (5 mg total) by mouth daily.    furosemide 40 mg tablet Take 1 tablet (40 mg total) by mouth two (2) times daily.    lisinopril 5 mg tablet Take 1 tablet (5 mg total) by mouth daily.    omeprazole 20 mg DR capsule Take 1 capsule (20 mg total) by mouth daily.    oxyCODONE 5 mg tablet Take 1 tablet (5 mg total) by mouth every six (6) hours as needed. Max Daily Amount: 20 mg    polyethylene glycol powder Mix 1 capful (17 grams) in liquid and drink by mouth daily.    potassium chloride 20 mEq ER tablet Take 2 tablets (40 mEq total) by mouth two (2) times daily.    simvastatin 40 mg tablet Take 1 tablet (40 mg total) by mouth at bedtime.     No current facility-administered medications for this visit.         Objective:  There were no vitals taken for this visit.    GENERAL APPEARANCE: alert, cooperative, oriented, and in no acute distress  PSYCH: Normal mood and affect       Labs:    February 02, 2023   Free T4 1.0   Acth pending, cortisol 22.0 collected at 7:44 a.m.   FSH 14.1, LH 5.4  Prolactin 14.5 (2-20)  Growth hormone 0.1  IGF-1 pending     January 04, 2023   Glucose 118, creatinine 0.69, potassium 3.1, sodium 142   Calcium 8.3, phosphorus 3.0, magnesium 1.6     December 03, 2022   TSH 0.9    Imaging:           MRI Brain Dec 31, 2022     IMPRESSION:     1.  Left mastoid and left middle ear effusion. Bony integrity of the temporal bone is better characterized on CT temporal bone from the same day.  2.  Prominent arachnoid granulations of the bilateral sphenoid wings and left posterior paramedian occiput with suggestion of associated encephaloceles. Bony integrity in these regions are better assessed by CT.  3.  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 There has been evolution of internal blood products compared to the hematocrit level seen on the prior outside brain MRI from 08/2022. The nodule   contacts and may mildly displace the optic chiasm and prechiasmatic optic nerves and about approximately 180 degrees of the left cavernous and supraclinoid internal carotid artery. 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 This may   be further evaluated with a dedicated MRI pituitary study.  4.  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  5.  Stable small homogenously  enhancing dural based nodule along the left frontal convexity, most likely a meningioma.      Problems and orders linked to this visit:   Diagnoses and all orders for this visit:    Pituitary macroadenoma with extrasellar extension (HCC/RAF)    Prediabetes    Antiphospholipid syndrome (HCC/RAF)        Assessment/Plan:    1. Sellar/suprasellar mass, consistent with pituitary macroadenoma.  Status post TNTS 2010, possible recurrence versus residual tissue  Patient with history of pituitary macroadenoma diagnosed in 2010 during investigation of vision loss.  Records not available from that time, but patient underwent TNTS in [place] for tumor resection and reports that vision improved after.  Started with left ear drainage spring 2024, diagnosed with CSF leak status post left mastoidectomy and leak repair August 2024.  MRI pituitary December 31, 2022 shows sellar/suprasellar mass showing pituitary tumor +blood products  Mass effect.  Patient has blurry vision, she will see Ophthalmology February 13, 2023 for full evaluation.  Evaluation of pituitary hormone deficiency.  Patient has inappropriately normal gonadotropins for post menopause, but reassuring thyroid and corticotrope axis.  No symptoms suggestive of AVP deficiency  Evaluation forf pituitary hormone excess.  Normal IGF-1 and prolactin level.  Does not appear cushingoid, but patient has history of weight gain over the years and prediabetes so consider investigation for Cushing syndrome.    Patient will alert me when she completes eye exam  plan follow up accordingly.     All questions answered. Patient expressed understanding and agreement.   I appreciate the opportunity to participate in this patient's care.    Evern Core, MD PhD  Endocrinologist    45 minutes were spent personally by me today on this encounter which include today's pre-visit review of the chart, obtaining appropriate history, performing an evaluation, documentation and discussion of management with details supported within the note for today's visit. The time documented was exclusive of any time spent on the separately billed procedure.    Parts of this note where written via Fluency Direct dictation system. Please excuse any typographical errors. Occasional wrong-word or ''sound-alike'' substitutions may have occurred due to the inherent limitations of voice recognition software. Please read the chart carefully and recognize, using context, where these substitutions have occurred.

## 2023-02-07 ENCOUNTER — Ambulatory Visit: Payer: Commercial Managed Care - Pharmacy Benefit Manager

## 2023-02-09 ENCOUNTER — Ambulatory Visit: Payer: Commercial Managed Care - Pharmacy Benefit Manager

## 2023-02-10 ENCOUNTER — Ambulatory Visit: Payer: Commercial Managed Care - Pharmacy Benefit Manager

## 2023-02-10 ENCOUNTER — Telehealth: Payer: Commercial Managed Care - Pharmacy Benefit Manager

## 2023-02-10 DIAGNOSIS — R7989 Other specified abnormal findings of blood chemistry: Secondary | ICD-10-CM

## 2023-02-11 MED ORDER — DEXAMETHASONE 1 MG PO TABS
1 mg | ORAL_TABLET | Freq: Once | ORAL | 0 refills | Status: AC
Start: 2023-02-11 — End: ?

## 2023-02-11 NOTE — Telephone Encounter
February 02, 2023   Acth 62 (6-50)  IGF-1 25, Z-score -2.4      We will obtain 1 mg dexamethasone suppression test cortisol and late-night salivary cortisol x2

## 2023-02-17 ENCOUNTER — Telehealth: Payer: Commercial Managed Care - Pharmacy Benefit Manager

## 2023-02-17 NOTE — Telephone Encounter
Eye exam February 13, 2023

## 2023-02-26 ENCOUNTER — Ambulatory Visit: Payer: Commercial Managed Care - Pharmacy Benefit Manager

## 2023-02-26 ENCOUNTER — Telehealth: Payer: Commercial Managed Care - Pharmacy Benefit Manager

## 2023-02-27 NOTE — Telephone Encounter
February 19, 2024   1 mg dexamethasone suppression test cortisol 1.6   Dexamethasone level 598 (180-580)        Borderline results   Late-night salivary cortisol levels pending

## 2023-03-02 ENCOUNTER — Telehealth: Payer: Commercial Managed Care - Pharmacy Benefit Manager

## 2023-03-02 ENCOUNTER — Ambulatory Visit: Payer: Commercial Managed Care - Pharmacy Benefit Manager

## 2023-03-02 DIAGNOSIS — R7989 Other specified abnormal findings of blood chemistry: Secondary | ICD-10-CM

## 2023-03-03 NOTE — Telephone Encounter
Late-night salivary cortisol  Component      Latest Ref Rng 02/19/2023  7:45 AM 02/19/2023  7:51 AM   Cortisol, Saliva,LC/MS/MS ([facility])      mcg/dL 4.54  0.98    Nl <1.19

## 2023-03-09 ENCOUNTER — Ambulatory Visit: Payer: Commercial Managed Care - Pharmacy Benefit Manager

## 2023-03-10 ENCOUNTER — Ambulatory Visit: Payer: Commercial Managed Care - Pharmacy Benefit Manager

## 2023-03-10 DIAGNOSIS — E237 Disorder of pituitary gland, unspecified: Secondary | ICD-10-CM

## 2023-03-11 ENCOUNTER — Ambulatory Visit: Payer: Commercial Managed Care - Pharmacy Benefit Manager

## 2023-03-12 ENCOUNTER — Ambulatory Visit: Payer: Commercial Managed Care - Pharmacy Benefit Manager

## 2023-03-12 DIAGNOSIS — E237 Disorder of pituitary gland, unspecified: Secondary | ICD-10-CM

## 2023-03-16 ENCOUNTER — Ambulatory Visit: Payer: Commercial Managed Care - Pharmacy Benefit Manager

## 2023-03-18 ENCOUNTER — Telehealth: Payer: Commercial Managed Care - Pharmacy Benefit Manager

## 2023-03-18 NOTE — Telephone Encounter
Call Back Request  Message to Practice/Provider      Message: Pt called requesting for MRI order to be faxed to [facility] Ph# [phone].   PT did not have the fax#.     Thank you    Patient or caller has been notified that their message will be reviewed within 1-2 business days.

## 2023-03-19 ENCOUNTER — Ambulatory Visit: Payer: Commercial Managed Care - Pharmacy Benefit Manager

## 2023-03-20 ENCOUNTER — Ambulatory Visit: Payer: Commercial Managed Care - Pharmacy Benefit Manager

## 2023-03-20 NOTE — Telephone Encounter
Auth for MRI requested. Pending approval.

## 2023-03-25 ENCOUNTER — Ambulatory Visit: Payer: PRIVATE HEALTH INSURANCE

## 2023-03-25 DIAGNOSIS — G96 CSF leak: Secondary | ICD-10-CM

## 2023-03-31 ENCOUNTER — Ambulatory Visit: Payer: Commercial Managed Care - Pharmacy Benefit Manager

## 2023-04-06 ENCOUNTER — Telehealth: Payer: Commercial Managed Care - Pharmacy Benefit Manager

## 2023-04-06 ENCOUNTER — Ambulatory Visit: Payer: Commercial Managed Care - Pharmacy Benefit Manager

## 2023-04-06 NOTE — Telephone Encounter
Hello,    Brandy from [facility] called in regards to patients referral. Patient was referred to their office to Dr. Jackey Loge. But referral states ''CSF Leaks'' which provider does not see for. Patient stated she wanted to book with Dr Olena Leatherwood for Tinnitus .   They would like for patients referral to be changed to diagnosis stating ''tinnitus'' instead of CFS leak. And patient would also need a referral for audiology which can be done with them as well.        Thank you

## 2023-04-07 ENCOUNTER — Telehealth: Payer: Commercial Managed Care - Pharmacy Benefit Manager

## 2023-04-07 NOTE — Telephone Encounter
Addressed via mychart

## 2023-04-07 NOTE — Telephone Encounter
Call Back Request      Reason for call back: Patient called in to provide MRI center [facility] fax number [phone]    Any Symptoms:  []  Yes  []  No      If yes, what symptoms are you experiencing:    Duration of symptoms (how long):    Have you taken medication for symptoms (OTC or Rx):      If call was taken outside of clinic hours:    [] Patient or caller has been notified that this message was sent outside of normal clinic hours.     [] Patient or caller has been warm transferred to the physician's answering service. If applicable, patient or caller informed to please call us back if symptoms progress.  Patient or caller has been notified of the turnaround time of 1-2 business day(s).

## 2023-04-07 NOTE — Telephone Encounter
Orders Request    What is being requested? (Tests, Labs, Imaging, etc.):   MRI pituitary   Reason for the request:   Pt wants to be seen outside of [facility]  Where does the patient want to be seen?    [facility]   If outside [facility], what is the fax number to the facility?    Pt will call back with fax number  Has the patient seen their doctor for this matter?     Last office visit:     Patient or caller was offered an appointment but declined.    Patient or caller has been notified of the turnaround time of 1-2 business day(s).

## 2023-04-07 NOTE — Telephone Encounter
 Office addressed encounter

## 2023-04-07 NOTE — Telephone Encounter
Mri faxed

## 2023-04-08 ENCOUNTER — Ambulatory Visit: Payer: Commercial Managed Care - Pharmacy Benefit Manager

## 2023-04-08 DIAGNOSIS — H9319 Tinnitus, unspecified ear: Secondary | ICD-10-CM

## 2023-05-07 ENCOUNTER — Other Ambulatory Visit: Payer: Commercial Managed Care - Pharmacy Benefit Manager

## 2023-05-08 ENCOUNTER — Inpatient Hospital Stay: Payer: Commercial Managed Care - Pharmacy Benefit Manager

## 2023-05-08 DIAGNOSIS — R6889 Other general symptoms and signs: Secondary | ICD-10-CM

## 2023-05-08 NOTE — Progress Notes
PATIENTBethannie Santana   MRN: [id number]  DOB: 09-27-1945  DATE OF SERVICE: 05/11/2023   Verbal consent was obtained from the patient to proceed with the telemedicine consultation via a secure, HIPAA compliant web interface. Patient expressed full understanding that the interaction will remain strictly confidential and that the patient has the right to stop the session immediately, if he/she wishes.     Neurosurgery Telemedicine Progress Note     ID:  Patient is a 78 y.o. 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 The patient previously reported on 10/06/2022, disequilibrium and balance issues, as well as fluid drainage from the left ear over 6 months ago, concerning for left ear infection for which she was started on a course of antibiotics. She had beta 2 transferrin sent, which was positive. She consulted with ENT that placed a patch to stop the leaking. It worked partially and started coming out the nose. 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 Since then, the patient is now s/p left mastoidectomy and CSF leak repair with left abdominal fat graft on 01/01/2023 with Dr. Threasa Beards of Neurosurgery and Dr. Baird Cancer of ENT.   The patient has since presented to Dr. Evern Core of Endocrinology on 02/06/2023 with normal IGF-1 and prolactin levels, and inappropriately normal gonadotropins for post menopause. The patient also underwent visual field testing on 02/13/2023 with Dr. Van Clines of  [facility], which was notable for significant worsening of vision that does not appear c/w pituitary lesion or CSF leaking. I had the pleasure of speaking with the patient today via telemedicine conference for follow-up and review of recent studies. Interval History: ***    Physical Exam:   There were no vitals taken for this visit.  General: No acute distress.  Neurological: Awake, attentive and interactive.  Heart: RRR  Chest: Grossly symmetric  Head and Neck: Prior craniotomy incision: ***. Otherwise, NCAT. Neck supple with full range of motion  Cranial nerves: II-XII grossly intact with PERRL, EOMI, VFFTC bilaterally, face symmetric, tongue and uvula midline, SCM and traps bilaterally equal  Musculoskeletal: 5/5 x4. No drift  Reflexes:      Right   Left  BC       2+   2+  Patella       2+   2+  Sensory: Intact to LT x4.  Cerebellar: Normal FTN bilaterally. No dysdiadokinesia.  Language: ***Normal production, comprehension, and repetition.  Gait: Normal.  Wounds:    Medications  No outpatient medications have been marked as taking for the 05/11/23 encounter (Appointment) with Aquilla Hacker, MD.         Review of Imaging:    MRI: I have personally reviewed the MRI dated 04/30/2023 and have reviewed the radiologist interpretation with which I agree:      Visual Field Testing: I have personally reviewed the visual field testing dated 02/13/2023 and have reviewed the radiologist interpretation with which I agree:        Assessment:   1.***  2.***    Plan:   1. ***  2. ***    Follow up:        Please note, I spent a total of *** minutes in face-to-face consultation with this patient, over half of which was spent  counseling the patient and coordinating care.    SCRIBE SIGNATURE(S):   I, Laie, have assisted Aquilla Hacker, MD with the documentation for Elizabeth Santana on 05/08/2023 at 3:28 PM.    PHYSICIAN SIGNATURE(S):  Aquilla Hacker, MD  05/08/2023 3:28 PM    I have reviewed this note, written by Donny Pique and attest that it is an accurate representation of my H & P and other events of the outpatient visit except if otherwise noted.

## 2023-05-08 NOTE — Telephone Encounter
Uploaded faxed report and called [facility] for images. LVM for call back to get images uploaded

## 2023-05-11 ENCOUNTER — Telehealth: Payer: Commercial Managed Care - Pharmacy Benefit Manager

## 2023-05-11 ENCOUNTER — Other Ambulatory Visit: Payer: Commercial Managed Care - Pharmacy Benefit Manager

## 2023-05-11 ENCOUNTER — Ambulatory Visit: Payer: Commercial Managed Care - Pharmacy Benefit Manager

## 2023-05-12 NOTE — Telephone Encounter
Pt called but was away from the phone when the call was transferred. Will call back to let her know when we will reschedule.

## 2023-05-12 NOTE — Telephone Encounter
Addressed in other encounter 

## 2023-05-15 ENCOUNTER — Other Ambulatory Visit: Payer: Commercial Managed Care - Pharmacy Benefit Manager

## 2023-05-18 NOTE — Progress Notes
This encounter was created in error - please disregard.  You do not need to unclick the Share w/Patient button because Erroneous Encounters are not shown in [facility], the patient portal.

## 2023-05-27 ENCOUNTER — Other Ambulatory Visit: Payer: Commercial Managed Care - Pharmacy Benefit Manager

## 2023-06-01 ENCOUNTER — Ambulatory Visit: Payer: Commercial Managed Care - Pharmacy Benefit Manager

## 2023-06-01 NOTE — Progress Notes
PATIENTKingston Santana   MRN: [id number]  DOB: 1946/01/29  DATE OF SERVICE: 06/01/2023   Verbal consent was obtained from the patient to proceed with the telemedicine consultation via a secure, HIPAA compliant web interface. Patient expressed full understanding that the interaction will remain strictly confidential and that the patient has the right to stop the session immediately, if she wishes.       Neurosurgery Telemedicine Progress Note     ID:  Patient is a 78 y.o. 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 MRI of the pituitary on 09/26/2022 demonstrated recurrence of her pituitary tumor. This prompted Neurosurgery consultation on 10/06/2022, at which time I advised her to follow up with Dr. Threasa Beards of [facility] Neurosurgery and Dr. Baird Cancer of [facility] and Neck Surgery for evaluation and tegmen dehiscence repair prior to considering pituitary surgery. The patient is now s/p left infratemporal post auricular approach including mastoidectomy with exposure of the external auditory canal with resection of posterior cranial fossa meningocele and secondary repair of dura for CSF leak reapir posterior cranial fossa of the skull base on 01/01/2023. I had the pleasure of speaking with the patient today via telemedicine conference for follow-up and review of recent imaging.     Interval History: Today, the patient reports doing well overall since her surgery on 01/01/2023. However, she endorses intermittent pain from her left hear that radiates down her neck, as well as fluid drainage from her left ear.    Physical Exam:   There were no vitals taken for this visit.  General: No acute distress.  Neurological: Awake, attentive and interactive.  Cranial nerves: II-XII grossly intact with EOMI  Language: Normal production and comprehension.    Medications  Medications that the patient states to be currently taking   Medication Sig    acetaminophen 325 mg tablet Take 2 tablets (650 mg total) by mouth every six (6) hours as needed.    apixaban (ELIQUIS) 5 mg tablet Take 1 tablet (5 mg total) by mouth two (2) times daily.    cefdinir 300 mg capsule take 1 capsule (300 mg total) by mouth two times a day for 7 days.    DULoxetine 60 mg DR capsule 1 capsule (60 mg total) daily.    ezetimibe 10 mg tablet Take 0.5 tablets (5 mg total) by mouth daily.    furosemide 40 mg tablet Take 1 tablet (40 mg total) by mouth two (2) times daily.    lisinopril 5 mg tablet Take 1 tablet (5 mg total) by mouth daily.    omeprazole 20 mg DR capsule Take 1 capsule (20 mg total) by mouth daily.    polyethylene glycol powder Mix 1 capful (17 grams) in liquid and drink by mouth daily.    potassium chloride 20 mEq ER tablet Take 2 tablets (40 mEq total) by mouth two (2) times daily.    simvastatin 40 mg tablet Take 1 tablet (40 mg total) by mouth at bedtime.    tiZANidine 2 mg tablet take 1 tablet by mouth three times a day as needed for muscle spasm         Review of Imaging:  MRI: I have personally reviewed the MRI dated 04/30/2023:  Per my review, imaging demonstrates stability of the pituitary lesion, which abuts the optic chiasm.          Assessment:   Elizabeth Santana is a 78 year old female with a history of pituitary adenoma s/p TNTS in 2010 for resection in [place] c/b hemorrhagic left MCA strokes, now s/p left infratemporal post auricular approach including mastoidectomy with exposure of the external auditory canal with resection of posterior cranial fossa meningocele and secondary repair of dura for CSF leak reapir posterior cranial fossa of the skull base on 01/01/2023.  I reviewed the patient's most recent MRI from 04/30/2023 demonstrating stability of the pituitary adenoma with abutment of the optic chiasm, compared to prior imaging studies. I reviewed the diagnosis of pituitary adenoma, the natural history, and associated material risks. Given stable radiographic imaging findings and intact vision, I recommend continuing with observational management with a repeat MRI of the pituitary in 6 months for surveillance of interval changes. Pending imaging results, I advised the patient to follow up with Shaune Pascal, NP to review findings. In the interim, I advised that patient to follow up with Dr. Threasa Beards and Dr. Baird Cancer regarding her reported fluid leak from her left ear, as this is concerning for a CSF leak which can lead to infection if left untreated. With regards to the patient's inquiry about a hearing test, I advised that she can present to her local ENT for further evaluation. The patient has expressed an understanding and agreement of the plan. All questions were answered.    Plan:  - Obtain a repeat MRI of the pituitary w wo contrast in 6 months for surveillance of interval changes    Follow up: after imaging with Shaune Pascal, NP    Please note, I spent a total of 30 minutes in face-to-face consultation with this patient, over half of which was spent counseling the patient and coordinating care.    SCRIBE SIGNATURE(S):   I, Hurshel Party, have assisted Aquilla Hacker, MD with the documentation for Cindi Ghazarian on 06/01/2023 at 4:23 PM.    PHYSICIAN SIGNATURE(S):  Aquilla Hacker, MD  06/01/2023 4:23 PM    I have reviewed this note, written by Hurshel Party and attest that it is an accurate representation of my H & P and other events of the outpatient visit except if otherwise noted.

## 2023-06-02 ENCOUNTER — Other Ambulatory Visit: Payer: Commercial Managed Care - Pharmacy Benefit Manager

## 2023-06-03 ENCOUNTER — Other Ambulatory Visit: Payer: Commercial Managed Care - Pharmacy Benefit Manager

## 2023-06-03 ENCOUNTER — Ambulatory Visit: Payer: Commercial Managed Care - Pharmacy Benefit Manager

## 2023-06-03 ENCOUNTER — Telehealth: Payer: Commercial Managed Care - Pharmacy Benefit Manager

## 2023-06-03 DIAGNOSIS — E237 Disorder of pituitary gland, unspecified: Secondary | ICD-10-CM

## 2023-06-03 NOTE — Telephone Encounter
Call Back Request      Reason for call back: pt is requesting for a note indicating why Dr Selena Batten doesnt think patient needs surgery for anything to provide to her PCP   Please send via My Chart      Any Symptoms:  []  Yes  []  No      If yes, what symptoms are you experiencing:    Duration of symptoms (how long):    Have you taken medication for symptoms (OTC or Rx):      If call was taken outside of clinic hours:    [] Patient or caller has been notified that this message was sent outside of normal clinic hours.     [] Patient or caller has been warm transferred to the physician's answering service. If applicable, patient or caller informed to please call us back if symptoms progress.  Patient or caller has been notified of the turnaround time of 1-2 business day(s).

## 2023-06-04 ENCOUNTER — Other Ambulatory Visit: Payer: PRIVATE HEALTH INSURANCE

## 2023-06-04 DIAGNOSIS — E237 Disorder of pituitary gland, unspecified: Secondary | ICD-10-CM

## 2023-06-04 NOTE — Telephone Encounter
Note shared with patient in other message

## 2023-06-04 NOTE — Telephone Encounter
Note was sent through mychart per request

## 2023-06-06 ENCOUNTER — Other Ambulatory Visit: Payer: Commercial Managed Care - Pharmacy Benefit Manager

## 2023-06-08 ENCOUNTER — Other Ambulatory Visit: Payer: Commercial Managed Care - Pharmacy Benefit Manager

## 2023-06-08 ENCOUNTER — Telehealth: Payer: Commercial Managed Care - Pharmacy Benefit Manager

## 2023-06-08 NOTE — Telephone Encounter
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       CBN: [phone]      Any Symptoms:  []  Yes  [x]  No      If yes, what symptoms are you experiencing:    Duration of symptoms (how long):    Have you taken medication for symptoms (OTC or Rx):      If call was taken outside of clinic hours:    [] Patient or caller has been notified that this message was sent outside of normal clinic hours.     [] Patient or caller has been warm transferred to the physician's answering service. If applicable, patient or caller informed to please call us back if symptoms progress.  Patient or caller has been notified of the turnaround time of 1-2 business day(s).

## 2023-06-10 ENCOUNTER — Other Ambulatory Visit: Payer: Commercial Managed Care - Pharmacy Benefit Manager

## 2023-06-15 ENCOUNTER — Other Ambulatory Visit: Payer: PRIVATE HEALTH INSURANCE

## 2023-06-15 NOTE — Telephone Encounter
Patient has been scheduled for video visit with Dr. Baird Cancer

## 2023-06-23 ENCOUNTER — Other Ambulatory Visit: Payer: PRIVATE HEALTH INSURANCE

## 2023-06-24 NOTE — Telephone Encounter
 Per appt notes, pt already met with MD to discuss concerns on imaging. Pt is scheduled with Dr. Threasa Beards and Dr. Baird Cancer to review new imaging.

## 2023-06-26 ENCOUNTER — Other Ambulatory Visit: Payer: PRIVATE HEALTH INSURANCE

## 2023-06-27 ENCOUNTER — Other Ambulatory Visit: Payer: PRIVATE HEALTH INSURANCE

## 2023-06-29 NOTE — Telephone Encounter
 Office addressed encounter

## 2023-07-06 ENCOUNTER — Ambulatory Visit: Payer: PRIVATE HEALTH INSURANCE

## 2023-07-22 ENCOUNTER — Ambulatory Visit: Payer: PRIVATE HEALTH INSURANCE

## 2023-08-06 ENCOUNTER — Other Ambulatory Visit: Payer: Commercial Managed Care - Pharmacy Benefit Manager

## 2023-08-16 ENCOUNTER — Other Ambulatory Visit: Payer: Commercial Managed Care - Pharmacy Benefit Manager

## 2023-09-09 ENCOUNTER — Ambulatory Visit: Payer: Commercial Managed Care - Pharmacy Benefit Manager

## 2023-09-09 DIAGNOSIS — E237 Disorder of pituitary gland, unspecified: Secondary | ICD-10-CM

## 2023-09-09 NOTE — Progress Notes
 PATIENTFontella Santana   MRN: [id number]  DOB: January 03, 1946  DATE OF SERVICE: 09/09/2023   Verbal consent was obtained from the patient to proceed with the telemedicine consultation via a secure, HIPAA compliant web interface. Patient expressed full understanding that the interaction will remain strictly confidential and that the patient has the right to stop the session immediately, if she wishes.     Neurosurgery Telemedicine Progress Note   ID:  Elizabeth Santana is a 78 y.o. 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 MRI of the pituitary on 09/26/2022 demonstrated recurrence of her pituitary tumor. The patient is now s/p left infratemporal post auricular approach including mastoidectomy with exposure of the external auditory canal with resection of posterior cranial fossa meningocele and secondary repair of dura for CSF leak reapir posterior cranial fossa of the skull base on 01/01/2023. I last spoke with the patient on 10/09/2022, at which time I advised the patient to follow up with Dr. Marchia Seton regarding scheduling surgery for left mastoidectomy. I had the pleasure of speaking with the patient via telemedicine conference for follow up evaluation.     Interval History:  The patient reports that she has been doing well overall with the exception of fluid in the left ear, without notable drainage. She is otherwise during well overall with no new or worsening neurological symptoms.      Physical Exam:   There were no vitals taken for this visit.  General: No acute distress.  Neurological: Awake, attentive and interactive.  Language: Speech production and comprehension intact    Medications  Medications that the patient states to be currently taking   Medication Sig    acetaminophen  325 mg tablet Take 2 tablets (650 mg total) by mouth every six (6) hours as needed.    cefdinir 300 mg capsule take 1 capsule (300 mg total) by mouth two times a day for 7 days.    DULoxetine  60 mg DR capsule 1 capsule (60 mg total) daily.    ezetimibe  10 mg tablet Take 0.5 tablets (5 mg total) by mouth daily.    furosemide  40 mg tablet Take 1 tablet (40 mg total) by mouth two (2) times daily.    lisinopril  5 mg tablet Take 1 tablet (5 mg total) by mouth daily.    omeprazole 20 mg DR capsule Take 1 capsule (20 mg total) by mouth daily.    polyethylene glycol powder Mix 1 capful (17 grams) in liquid and drink by mouth daily.    potassium chloride  20 mEq ER tablet Take 2 tablets (40 mEq total) by mouth two (2) times daily.    simvastatin  40 mg tablet Take 1 tablet (40 mg total) by mouth at bedtime.    tiZANidine 2 mg tablet take 1 tablet by mouth three times a day as needed for muscle spasm       Review of Imaging:    MRI: I have personally reviewed the MRI dated 04/30/2023, which per my interpretation demonstrates a stable pituitary adenoma when compared to prior exams.       Assessment:   Elizabeth Santana is a 78 year old female with a history of pituitary  adenoma s/p TNTS in 2010 for resection in [place]  c/b hemorrhagic left MCA strokes, now s/p left infratemporal post auricular approach including mastoidectomy with exposure of the external auditory canal with resection of posterior cranial fossa meningocele and secondary repair of dura for CSF leak reapir posterior cranial fossa of the skull base on 01/01/2023.   I reviewed the patient's most recent MRI on 04/30/2023, which per my interpretation demonstrates a stable pituitary adenoma when compared to prior exams. Given stable radiographic imaging, I advised the patient to continue with observational management as discussed with Dr. Burdette Carolin, with a repeat MRI of the pituitary in June/July 2025. Following completion of imaging, I advised the patient to return for follow up for review of imaging findings. In the interim, in the event the patient develops any new or worsening neurological symptoms, I advised the patient to inform my office prompting sooner evaluation and imaging. The patient has expressed an understanding and agreement of the plan. All questions were answered.    Plan:   1. Obtain repeat MRI of the pituitary w-wo contrast in June/July 2025 as discussed with Dr. Burdette Carolin of [facility] Neurosurgery    Follow up: after imaging     On this encounter, this neurosurgical visit was complex for medical decision making, which may include today's pre-visit review of the chart, time spent during the visit, and today's time spent after the visit  documenting and coordinating care.         SCRIBE SIGNATURE(S):   I, Hulan Szumski Laraine Plate, have assisted Armen Bernhardt, MD with the documentation for Dniyah Berdugo on 09/09/2023 at 10:53 AM.    PHYSICIAN SIGNATURE(S):  Armen Bernhardt, MD  09/09/2023 10:53 AM    I have reviewed this note, written by Suzzanna Estimable and attest that it is an accurate representation of my H & P and other events of the outpatient visit except if otherwise noted.

## 2023-10-22 ENCOUNTER — Other Ambulatory Visit: Payer: PRIVATE HEALTH INSURANCE

## 2023-10-23 ENCOUNTER — Telehealth: Payer: PRIVATE HEALTH INSURANCE

## 2023-10-23 NOTE — Telephone Encounter
 Message to Practice/Provider      Message: Pt would like MRI order sent to [facility] in [place].      [address], [place], [place] [address]    P# [phone]    Return call is not being requested by the patient or caller.    Patient or caller has been notified that their message will be reviewed within 1-2 business days.

## 2023-10-26 NOTE — Telephone Encounter
 Office addressed encounter

## 2023-10-26 NOTE — Telephone Encounter
 Order faxed to [facility] at [phone]

## 2023-10-29 ENCOUNTER — Other Ambulatory Visit: Payer: PRIVATE HEALTH INSURANCE

## 2023-10-30 NOTE — Telephone Encounter
 Office addressed encounter

## 2023-12-21 ENCOUNTER — Inpatient Hospital Stay: Payer: Commercial Managed Care - Pharmacy Benefit Manager

## 2023-12-21 ENCOUNTER — Inpatient Hospital Stay: Payer: PRIVATE HEALTH INSURANCE

## 2023-12-21 ENCOUNTER — Other Ambulatory Visit: Payer: PRIVATE HEALTH INSURANCE

## 2023-12-21 DIAGNOSIS — Z719 Counseling, unspecified: Secondary | ICD-10-CM

## 2023-12-21 NOTE — Telephone Encounter
 Call Back Request      Reason for call back: Patient states she had her MRI done on 08/08 and asking if Dr Luke will call her with the results or should she schedule a follow up, please assist, thank you     CB [phone]    Any Symptoms:  []  Yes  [x]  No      If yes, what symptoms are you experiencing:    Duration of symptoms (how long):    Have you taken medication for symptoms (OTC or Rx):      If call was taken outside of clinic hours:    [] Patient or caller has been notified that this message was sent outside of normal clinic hours.     [] Patient or caller has been warm transferred to the physician's answering service. If applicable, patient or caller informed to please call us  back if symptoms progress.  Patient or caller has been notified of the turnaround time of 1-2 business day(s).

## 2023-12-21 NOTE — Telephone Encounter
 LVM requesting call back to confirm imaging done at [facility] (requeste via powershare) and to schedule f/u with NP

## 2023-12-21 NOTE — Telephone Encounter
 Message to Practice/Provider      Message:     Patient is returning Parmita's phone call. Please assist. Thank you Parmita for further assisting     Return call is not being requested by the patient or caller.    Patient or caller has been notified that their message will be reviewed within 1-2 business days.

## 2023-12-21 NOTE — Telephone Encounter
 Spoke with pt and scheduled video visit with NP pending images

## 2023-12-23 ENCOUNTER — Ambulatory Visit: Payer: PRIVATE HEALTH INSURANCE

## 2023-12-28 ENCOUNTER — Other Ambulatory Visit: Payer: PRIVATE HEALTH INSURANCE

## 2023-12-30 ENCOUNTER — Other Ambulatory Visit: Payer: PRIVATE HEALTH INSURANCE

## 2023-12-30 ENCOUNTER — Telehealth: Payer: PRIVATE HEALTH INSURANCE

## 2023-12-30 NOTE — Telephone Encounter
 Will sign note.

## 2023-12-30 NOTE — Telephone Encounter
 Call Back Request      Reason for call back: patient is asking about the status of her my chart message sent 08/25. Asked to send follow up asking to please review and reply      Any Symptoms:  []  Yes  []  No      If yes, what symptoms are you experiencing:    Duration of symptoms (how long):    Have you taken medication for symptoms (OTC or Rx):      If call was taken outside of clinic hours:    [] Patient or caller has been notified that this message was sent outside of normal clinic hours.     [] Patient or caller has been warm transferred to the physician's answering service. If applicable, patient or caller informed to please call us  back if symptoms progress.  Patient or caller has been notified of the turnaround time of 1-2 business day(s).

## 2023-12-31 ENCOUNTER — Other Ambulatory Visit: Payer: PRIVATE HEALTH INSURANCE

## 2023-12-31 ENCOUNTER — Telehealth: Payer: PRIVATE HEALTH INSURANCE

## 2023-12-31 NOTE — Telephone Encounter
 Hi Dr. Jeanelle,    Patient called the office regarding a message she sent to you in the portal but has not heard back from you. She says she had an MRI done on 12/11/23 to monitor her pituitary tumor. On that scan, there was concern for fluid or leakage that is spreading to the jaw. Dr. Norine Cramp is wondering if you can review the scan to determine if treatment is warranted and to determine next steps. Would you be able to review her scans and respond to her message in the MyChart portal?    Please advise,  Thanks,  Dena

## 2024-01-01 NOTE — Telephone Encounter
 Voicemail left for patient and message sent to [place].

## 2024-01-01 NOTE — Telephone Encounter
 Voicemail left for patient returning her call. Message sent to [place].

## 2024-01-03 NOTE — Progress Notes
 PATIENTEstera Santana   MRN: [id number]  DOB: May 05, 1946  DATE OF SERVICE: 12/23/2023     Neurosurgery Telemedicine Progress Note   Verbal consent was obtained from the patient to proceed with the telemedicine consultation via a secure, HIPAA compliant web interface. Patient expressed full understanding that the interaction will remain strictly confidential and that the patient has the right to stop the session immediately, if he/she wishes.       ID:  Elizabeth Santana is a 78 y.o. female    Interval History: ***    Physical Exam:   There were no vitals taken for this visit.  Quality of video: {Blank single:19197::''Telephone only'',''Poor quality'',''Good quality'',''Excellent quality''}  General: {Exam; general:16600::''alert'',''appears stated age'',''cooperative''}   Skin: {Blank single:19197::''Hyperpigmentation'',''Significant acne'',''Multiple areas of bruising are evident'',''The skin is thin and transparent'',''Skin color, texture, turgor normal. No rashes or lesions'',''Unable to determine'',''No obvious abnormalities seen on video view''}  Head: {Blank single:19197::''Rubral complexion, rounded face'',''Coarse features, enlarged jaw'',''Unable to determine'',''Normocephalic, without obvious abnormality''}   Mouth: {Blank single:19197::''Tongue is enlarged'',''Tongue is normal in appearance.'',''Unable to determine'',''No obvious abnormalities seen on video view''}  Eyes: {Exam; eye:201::''Conjunctivae clear'',''Unable to determine'',''No obvious abnormalities seen on video view''}   Dentition: {Blank single:19197::''Teeth spacing is widened'',''Teeth are normally spaced'',''Unable to determine'',''No obvious abnormalities seen on video view''}  Nose: {Exam; nose:13771::''Nares normal. No drainage'',''Unable to determine'',''No obvious abnormalities seen on video view''}   Abdomen: {Blank single:19197::''Marked truncal obesity'',''No straie'',''Nonviolaceous straie'',''Violaceous striae'',''soft, non-tender'',''Unable to determine'',''No obvious abnormalities seen on video view''} Extremities: {Blank single:19197::''Proximal atrophy'',''enlarged doughy hands and feet'',''atraumatic, no cyanosis or edema'',''Unable to determine'',''No obvious abnormalities seen on video view''}   Neurologic Exam  {Blank single:19197::''Alert, oriented x 3''}  {Blank single:19197::''Language function intact.''}  EOM: {Blank single:19197::''Unable to determine'',''Grossly intact''}   Psychiatric: {Blank single:19197::''odd affect'',''anxious'',''depressed'',''pressured speech'',''mood and affect are within normal limits''}         Medications  Medications that the patient states to be currently taking   Medication Sig    acetaminophen  325 mg tablet Take 2 tablets (650 mg total) by mouth every six (6) hours as needed.    ALPRAZolam 0.5 mg tablet Take 1 tablet (0.5 mg total) by mouth as needed for.    apixaban  (ELIQUIS ) 5 mg tablet Take 1 tablet (5 mg total) by mouth two (2) times daily.    atorvastatin 40 mg tablet Take 1 tablet (40 mg total) by mouth daily.    ezetimibe  10 mg tablet Take 0.5 tablets (5 mg total) by mouth daily.    furosemide  40 mg tablet Take 1 tablet (40 mg total) by mouth two (2) times daily.    lisinopril  5 mg tablet Take 1 tablet (5 mg total) by mouth daily.    MOUNJARO 2.5 MG/0.5ML injection     omeprazole 20 mg DR capsule Take 1 capsule (20 mg total) by mouth daily.    polyethylene glycol powder Mix 1 capful (17 grams) in liquid and drink by mouth daily.    potassium chloride  20 mEq ER tablet Take 2 tablets (40 mEq total) by mouth two (2) times daily.         Review of Imaging:    MRI: I have personally reviewed the MRI dated *** and have reviewed the radiologist interpretation with which I agree:        Assessment:   1.***  2.***    Plan:   1. ***  2. ***      Follow up:      ***minutes were spent personally by me today on  this encounter which include today's pre-visit review of the chart, obtaining appropriate history, performing an evaluation, documentation and discussion of management with details supported within the note for today's visit. The time documented was exclusive of any time spent on the separately billed procedure.       PROVIDER SIGNATURE(S):  Roark Vernell ORN., NP  01/03/2024 7:08 PM

## 2024-01-07 NOTE — Telephone Encounter
 Scheduled pt to come into for an appointment with Dr. Gopen next week Wednesday.

## 2024-01-07 NOTE — Telephone Encounter
 Hello Elizabeth Santana,  Pt called to request message she left you on her MyChart is forwarded to you as well    Thank You  Avelina

## 2024-01-13 ENCOUNTER — Ambulatory Visit: Payer: PRIVATE HEALTH INSURANCE

## 2024-02-01 NOTE — Telephone Encounter (Signed)
 Pt called in and left message with answering service on 02/02/2020 stateing she needs Rx for hearing aids. I called pt after consulting Dr. Ethyl, pt just needs a hearing test to get hearing aids, she had clearance from us .

## 2024-02-11 ENCOUNTER — Telehealth: Payer: PRIVATE HEALTH INSURANCE

## 2024-02-11 NOTE — Telephone Encounter
 Good afternoon, Dr.Gopen    Pt is requesting a call back to review her results of her MR.  She saw that she had ? a large amount of diffuse fluid build up in her mastoid ? She would like to be advised if she need antibiotics or what her next steps should be ?    Please advise,  Sudie Search

## 2024-02-12 ENCOUNTER — Other Ambulatory Visit: Payer: PRIVATE HEALTH INSURANCE

## 2024-02-12 DIAGNOSIS — G96 CSF leak: Secondary | ICD-10-CM

## 2024-02-12 DIAGNOSIS — R52 Pain, unspecified: Secondary | ICD-10-CM

## 2024-02-12 DIAGNOSIS — Z9089 Acquired absence of other organs: Secondary | ICD-10-CM

## 2024-02-12 DIAGNOSIS — H9319 Tinnitus, unspecified ear: Principal | ICD-10-CM

## 2024-02-12 NOTE — Progress Notes
 PATIENTRanya Santana               MRN: [id number]  DOB:  [birth year]    Patient Consent to Telehealth Questionnaire        No data to display              - I agree  to be treated via a video visit and acknowledge that I may be liable for any relevant copays or coinsurance depending on my insurance plan.  - I understand that this video visit is offered for my convenience and I am able to cancel and reschedule for an in-person appointment if I desire.  - I also acknowledge that sensitive medical information may be discussed during this video visit appointment and that it is my responsibility to locate myself in a location that ensures privacy to my own level of comfort.  - I also acknowledge that I should not be participating in a video visit in a way that could cause danger to myself or to those around me (such as driving or walking).  If my provider is concerned about my safety, I understand that they have the right to terminate the visit.     Due to the Covid-19 pandemic and the increased risk of visiting our multi-specialty facility which services patients including walk-in urgent care, we have offered this service as a temporary emergency measure.    Date of Service: 02/15/2024    This visit was initiated at the request of the patient.  Verbal consent was received.  Permanent Document Storage via the electronic record  Medical record extensively reviewed before and after visit for imaging, medications, past medical history.  Service provided via: real time audio-visual  Location of the patient at the time of service: [place] , their residence  Location of provider at the time of service:  Department of Head and Neck Surgery   Total time Spent: 30 minutes including review of medical records.      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 Milo Cassis fixed the more lateral defect, Dr. 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     On 01/14/23, the patient presents for her first post-operative appointment, approximately 2 weeks out. The patient reports severe pain near the surgical site and inquires about more pain medication. She continues to experience clear rhinorrhea.  No noted exacerbating or relieving factors.  The patient denies any fever, otalgia, otorrhea, otorrhagia, dizziness, or balance problems. No other associated or modifying otologic factors.    Today, the patient notes constant ear popping, tinnitus aurium, and aural fullness in the left ear. The patient also endorses hearing loss in the left ear. There was no period following the surgery where the patient felt that her muffled hearing or aural fullness improved. She also endorses balance dysfunction and intermittent dizziness. She notes occasional leaking from the nose, however denies salty post nasal drip. The patient has not visualized clear fluid from the ear however notes a sensation of fluid in the left ear. No noted exacerbating or  relieving factors.  The patient denies any otalgia, otorrhea, otorrhagia. No other associated or modifying otologic factors.      Physical Examination:   Gen: Normocephalic atraumatic   Constitutional: Well nourished, non-obese.   Eyes: Anicteric sclerae, Extra-occular movement normal and symmetric.   Ears: The detailed binocular microscopic ear exam is found in the procedure note listed below.   Respiratory: Breathing comfortably without stridor or retractions.   Cardiovascular: No jugulovenous distention or peripheral edema.   Lymphatic: No sign of cervical lymphatic malformations or adenopathy.   Musculoskeletal: No skeletal abnormalities or deformities.  Extremities: No clubbing or cyanosis.   Skin: No rashes or lesions on the head or neck.   Neurologic: Alert and oriented. Cranial nerve VII is intact and bilaterally symmetric. Gait is normal. There is no nystagmus.   Psychiatric: Non-anxious with appropriate affect.     Review of Clinical Data:   Imaging:   Outside MRI brain w-wo contrast: 12/11/23        MR brain wo+w contrast brain lab protocol: 01/02/2023  IMPRESSION: 1.  Left mastoid and left middle ear effusion. Bony integrity of the temporal bone is better characterized on CT temporal bone from the same day. 2.  Prominent arachnoid granulations of the bilateral sphenoid wings and left posterior paramedian occiput with suggestion of associated encephaloceles. Bony integrity in these regions are better assessed by CT. 3.  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 There has been evolution of internal blood products compared to the hematocrit level seen on the prior outside brain MRI from 08/2022. The nodule contacts and may mildly displace the optic chiasm and prechiasmatic optic nerves and about approximately 180 degrees of the left cavernous and supraclinoid internal carotid artery. 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 This may be further evaluated with a dedicated MRI pituitary study. 4.  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 5.  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     >CT temporal bones wo contrast: 01/01/2023  IMPRESSION: 1. Opacification of the left mastoid air cells and middle ear cavity. Description of marked thinning or dehiscence of the tegmen tympani as well as the tegmen mastoideum as described above. The potential for CSF leak is suggested.. 2. 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 Further assessment of the brain with contrast recommended. This mass was seen on the prior MRI of the brain from 09/26/2022 3. 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  Telemedicine visit. Exam deferred.      Impression/Plan:     IMPRESSION: Elizabeth Santana is a 78 y.o. female underwent  LEFT mastoidectomy for approach of middle cranial fossa encephaloceles, repair of skull-based defect in left middle cranial fossa (of the 2 defects, Dr. Milo Cassis fixed the more lateral defect, Dr. Jeanelle fixed the more medial defect due to ossicular involvement), abdominal fat graft  performed on 01/01/23  and is having an uneventful recovery.     Today, the patient notes constant ear popping, tinnitus aurium, and aural fullness in the left ear. The patient also endorses hearing loss in the left ear. There was no period following the surgery where the patient felt  that her muffled hearing or aural fullness improved. She also endorses balance dysfunction and intermittent dizziness. She notes occasional leaking from the nose, however denies salty post nasal drip. The patient has not visualized clear fluid from the ear however notes a sensation of fluid in the left ear. No noted exacerbating or relieving factors.  The patient denies any otalgia, otorrhea, otorrhagia. No other associated or modifying otologic factors.    I reviewed the patient's outside MR brain dated 12/11/23 which revealed a large opacification of the left mastoid cavity. It is unclear if the opacification represents scar tissue from her previous surgery or CSF leak.     I recommended the patient return to [facility] for an in person otomicroscopic examination to assess for fluid behind the tympanic membrane on the left however the patient notes travel difficulties. Given this, I recommend the patient follow up with a local ENT for an exam. Should there be evidence of fluid behind the tympanic membrane on the left side, it is likely the patient's mastoid opacification is secondary to  CSF leak. If her exam is normal, then it is likely the opacification is due to scar tissue.     I recommend the patient contact me after meeting with her local ENT to discuss next steps. I also advised the patient to seek immediate attention if she develops severe headaches, fever, neck stiffness, altered mental status, etc, as these could be signs of meningitis infection. The patient understood.     The patient was reassured regarding these findings, extensively counseled regarding risk factors and warning signs to watch for and they will follow-up with me in 3 months.  If there were to be any concerning or worrisome findings, they will immediately contact me.      PLAN:   >Follow up with local ENT for otomicroscopy assessment, rule out effusion on the left side.   >Ms. Schram was instructed to seek immediate attention if she develops any signs of meninginitis including severe headaches, fever, neck stiffness, or altered mental status.   >Follow up in 3 months  or sooner if any acute otologic issues arise.      Raianna Kithcart was given an opportunity to ask questions, and all questions asked were answered to the best of my ability. I encouraged Dawnielle Copus to email me with any further questions or concerns, or if an acute issue arises. Vinita Moronta was in agreement with this plan.      Thank you for the kind referral and for allowing me to share in the care of Courtland Girdner. If you have any questions, please do not hesitate to contact me.      Sincerely,    Bretta Schmid, MD  Professor  [facility] Department of Head & Neck Surgery  [facility]    [address], [address]  [place] [address]  [phone] (Appointments)  [phone] (Academic Office)  [phone] (Fax)      ATTESTATION      SCRIBE SIGNATURE(S):  I, Verneta Sheela Sine, am scribing for, and in the presence of Schmid Bretta, MD with the documentation for Kahdijah Errickson on 02/15/2024 at 10:33 AM. Please excuse typographical errors that may have occurred in scribe transcription.    PHYSICIAN SIGNATURE(S):  Schmid Bretta, MD 02/15/2024 10:33 AM    I have reviewed this note, written by Verneta Sheela Sine and attest that it is an accurate representation of my H & P and other events  of the outpatient visit except if otherwise noted.

## 2024-02-15 ENCOUNTER — Ambulatory Visit: Payer: PRIVATE HEALTH INSURANCE

## 2024-02-29 ENCOUNTER — Other Ambulatory Visit: Payer: Commercial Managed Care - Pharmacy Benefit Manager
